# Patient Record
Sex: Female | Born: 1950 | Race: White | Hispanic: No | Marital: Married | Smoking: Former smoker
Health system: Southern US, Community
[De-identification: ages and names within clinical notes are randomized; demographics above are authoritative.]

## PROBLEM LIST (undated history)

## (undated) DIAGNOSIS — E785 Hyperlipidemia, unspecified: Principal | ICD-10-CM

## (undated) DIAGNOSIS — Z8619 Personal history of other infectious and parasitic diseases: Secondary | ICD-10-CM

## (undated) DIAGNOSIS — B059 Measles without complication: Secondary | ICD-10-CM

## (undated) DIAGNOSIS — I10 Essential (primary) hypertension: Secondary | ICD-10-CM

## (undated) DIAGNOSIS — R519 Headache, unspecified: Secondary | ICD-10-CM

## (undated) DIAGNOSIS — Z0000 Encounter for general adult medical examination without abnormal findings: Secondary | ICD-10-CM

## (undated) HISTORY — DX: Personal history of other infectious and parasitic diseases: Z86.19

## (undated) HISTORY — DX: Encounter for general adult medical examination without abnormal findings: Z00.00

## (undated) HISTORY — DX: Measles without complication: B05.9

## (undated) HISTORY — DX: Essential (primary) hypertension: I10

## (undated) HISTORY — DX: Hyperlipidemia, unspecified: E78.5

---

## 1994-12-14 HISTORY — PX: ABDOMINAL HYSTERECTOMY: SHX81

## 1998-09-03 ENCOUNTER — Other Ambulatory Visit: Admission: RE | Admit: 1998-09-03 | Discharge: 1998-09-03 | Payer: Self-pay | Admitting: Obstetrics and Gynecology

## 2001-02-04 ENCOUNTER — Encounter: Payer: Self-pay | Admitting: Gastroenterology

## 2001-02-04 ENCOUNTER — Ambulatory Visit (HOSPITAL_COMMUNITY): Admission: RE | Admit: 2001-02-04 | Discharge: 2001-02-04 | Payer: Self-pay | Admitting: Gastroenterology

## 2001-06-05 ENCOUNTER — Encounter: Payer: Self-pay | Admitting: Gastroenterology

## 2001-06-05 ENCOUNTER — Ambulatory Visit (HOSPITAL_COMMUNITY): Admission: RE | Admit: 2001-06-05 | Discharge: 2001-06-05 | Payer: Self-pay | Admitting: Gastroenterology

## 2002-02-10 ENCOUNTER — Other Ambulatory Visit: Admission: RE | Admit: 2002-02-10 | Discharge: 2002-02-10 | Payer: Self-pay | Admitting: Obstetrics and Gynecology

## 2004-06-14 ENCOUNTER — Encounter: Admission: RE | Admit: 2004-06-14 | Discharge: 2004-06-14 | Payer: Self-pay | Admitting: Neurosurgery

## 2004-07-05 ENCOUNTER — Encounter: Admission: RE | Admit: 2004-07-05 | Discharge: 2004-07-05 | Payer: Self-pay | Admitting: Neurosurgery

## 2004-07-21 ENCOUNTER — Encounter: Admission: RE | Admit: 2004-07-21 | Discharge: 2004-07-21 | Payer: Self-pay | Admitting: Neurosurgery

## 2004-09-27 ENCOUNTER — Encounter: Admission: RE | Admit: 2004-09-27 | Discharge: 2004-09-27 | Payer: Self-pay | Admitting: Family Medicine

## 2004-11-13 LAB — HM COLONOSCOPY: HM Colonoscopy: NORMAL

## 2012-02-02 ENCOUNTER — Telehealth: Payer: Self-pay | Admitting: Internal Medicine

## 2012-02-02 ENCOUNTER — Encounter: Payer: Self-pay | Admitting: Internal Medicine

## 2012-02-02 ENCOUNTER — Ambulatory Visit (INDEPENDENT_AMBULATORY_CARE_PROVIDER_SITE_OTHER): Payer: BC Managed Care – PPO | Admitting: Internal Medicine

## 2012-02-02 VITALS — BP 142/90 | HR 67 | Temp 97.7°F | Resp 18 | Ht 65.5 in | Wt 202.0 lb

## 2012-02-02 DIAGNOSIS — G8929 Other chronic pain: Secondary | ICD-10-CM

## 2012-02-02 DIAGNOSIS — R748 Abnormal levels of other serum enzymes: Secondary | ICD-10-CM

## 2012-02-02 DIAGNOSIS — Z79899 Other long term (current) drug therapy: Secondary | ICD-10-CM

## 2012-02-02 DIAGNOSIS — M549 Dorsalgia, unspecified: Secondary | ICD-10-CM

## 2012-02-02 DIAGNOSIS — Z0000 Encounter for general adult medical examination without abnormal findings: Secondary | ICD-10-CM

## 2012-02-02 DIAGNOSIS — I10 Essential (primary) hypertension: Secondary | ICD-10-CM

## 2012-02-02 LAB — CBC WITH DIFFERENTIAL/PLATELET
Basophils Absolute: 0 10*3/uL (ref 0.0–0.1)
Basophils Relative: 1 % (ref 0–1)
Eosinophils Absolute: 0.1 10*3/uL (ref 0.0–0.7)
Eosinophils Relative: 2 % (ref 0–5)
HCT: 45.2 % (ref 36.0–46.0)
Hemoglobin: 15.2 g/dL — ABNORMAL HIGH (ref 12.0–15.0)
Lymphocytes Relative: 39 % (ref 12–46)
Lymphs Abs: 2.5 10*3/uL (ref 0.7–4.0)
MCH: 30 pg (ref 26.0–34.0)
MCHC: 33.6 g/dL (ref 30.0–36.0)
MCV: 89.2 fL (ref 78.0–100.0)
Monocytes Absolute: 0.5 10*3/uL (ref 0.1–1.0)
Monocytes Relative: 8 % (ref 3–12)
Neutro Abs: 3.3 10*3/uL (ref 1.7–7.7)
Neutrophils Relative %: 51 % (ref 43–77)
Platelets: 272 10*3/uL (ref 150–400)
RBC: 5.07 MIL/uL (ref 3.87–5.11)
RDW: 12.7 % (ref 11.5–15.5)
WBC: 6.5 10*3/uL (ref 4.0–10.5)

## 2012-02-02 MED ORDER — LISINOPRIL-HYDROCHLOROTHIAZIDE 20-12.5 MG PO TABS: 1.0000 | ORAL_TABLET | Freq: Every day | ORAL | Status: DC

## 2012-02-02 MED ORDER — DICLOFENAC POTASSIUM 50 MG PO TABS: 50.0000 mg | ORAL_TABLET | Freq: Two times a day (BID) | ORAL | Status: DC | PRN

## 2012-02-02 NOTE — Patient Instructions (Signed)
Please schedule chem7, lft- v58.69 prior to next visit

## 2012-02-02 NOTE — Telephone Encounter (Signed)
Please schedule chem7, lft- v58.69 prior to next visit  Patient has upcoming appt on 08-02-12. Patient will be going to [facility] lab

## 2012-02-02 NOTE — Telephone Encounter (Signed)
Lab orders entered for September 2013. 

## 2012-02-03 LAB — HEPATIC FUNCTION PANEL
ALT: 60 U/L — ABNORMAL HIGH (ref 0–35)
AST: 40 U/L — ABNORMAL HIGH (ref 0–37)
Albumin: 4.5 g/dL (ref 3.5–5.2)
Alkaline Phosphatase: 115 U/L (ref 39–117)
Bilirubin, Direct: 0.1 mg/dL (ref 0.0–0.3)
Indirect Bilirubin: 0.5 mg/dL (ref 0.0–0.9)
Total Bilirubin: 0.6 mg/dL (ref 0.3–1.2)
Total Protein: 7.4 g/dL (ref 6.0–8.3)

## 2012-02-03 LAB — LIPID PANEL
Cholesterol: 181 mg/dL (ref 0–200)
HDL: 63 mg/dL (ref >39)
LDL Cholesterol: 109 mg/dL — ABNORMAL HIGH (ref 0–99)
Total CHOL/HDL Ratio: 2.9 Ratio
Triglycerides: 46 mg/dL (ref <150)
VLDL: 9 mg/dL (ref 0–40)

## 2012-02-03 LAB — BASIC METABOLIC PANEL
BUN: 18 mg/dL (ref 6–23)
CO2: 26 mEq/L (ref 19–32)
Calcium: 9.3 mg/dL (ref 8.4–10.5)
Chloride: 103 mEq/L (ref 96–112)
Creat: 0.74 mg/dL (ref 0.50–1.10)
Glucose, Bld: 86 mg/dL (ref 70–99)
Potassium: 4.4 mEq/L (ref 3.5–5.3)
Sodium: 140 mEq/L (ref 135–145)

## 2012-02-04 DIAGNOSIS — M549 Dorsalgia, unspecified: Secondary | ICD-10-CM | POA: Insufficient documentation

## 2012-02-04 DIAGNOSIS — G8929 Other chronic pain: Secondary | ICD-10-CM | POA: Insufficient documentation

## 2012-02-04 DIAGNOSIS — R748 Abnormal levels of other serum enzymes: Secondary | ICD-10-CM | POA: Insufficient documentation

## 2012-02-04 DIAGNOSIS — I10 Essential (primary) hypertension: Secondary | ICD-10-CM | POA: Insufficient documentation

## 2012-02-04 NOTE — Assessment & Plan Note (Signed)
Obtain lft 

## 2012-02-04 NOTE — Progress Notes (Signed)
  Subjective:    Patient ID: Jeanette Mckee, female    DOB: 05-05-1951, 61 y.o.   MRN: [id number]  HPI Pt presents to clinic for evaluation of multiple medical problems.  H/o HTN well controlled on outpt bp monitoring. Recent bp 110/70. Notes some degree of white coat htn.  Tolerates ace inhibitor without cough. Using cataflam prn for chronic intermittent lbp. S/p epidural injxns x3 in 2005 without improvement. Notes no gi adverse effect. H/o abn lft with liver bx 2005. Unknown results but was recommended for steroids which she declined. Changed diet and lft's normalized. Since then has had intermittent mild elevations only. utd with gyn for pap and mammograms.  Past Medical History  Diagnosis Date  . History of chicken pox     childhood  . Hypertension    Past Surgical History  Procedure Date  . Abdominal hysterectomy 1996    reports that she has quit smoking. She has never used smokeless tobacco. She reports that she drinks alcohol. She reports that she does not use illicit drugs. family history includes Alcohol abuse in her father; Arthritis in an unspecified family member; Diabetes in her father; Healthy in some unspecified family members; Heart attack in an unspecified family member; Heart disease in her father; Hypertension in her father; Prostate cancer in her father; and Stroke in her father.  There is no history of Breast cancer and Colon cancer. No Known Allergies   Review of Systems  Respiratory: Negative for cough and shortness of breath.   Cardiovascular: Negative for chest pain.  Gastrointestinal: Negative for abdominal pain.  Musculoskeletal: Positive for back pain.  All other systems reviewed and are negative.       Objective:   Physical Exam  Nursing note and vitals reviewed. Constitutional: She appears well-developed and well-nourished. No distress.  HENT:  Head: Normocephalic and atraumatic.  Right Ear: External ear normal.  Left Ear: External ear normal.    Nose: Nose normal.  Mouth/Throat: Oropharynx is clear and moist. No oropharyngeal exudate.  Eyes: Conjunctivae and EOM are normal. Pupils are equal, round, and reactive to light. Right eye exhibits no discharge. Left eye exhibits no discharge. No scleral icterus.  Neck: Neck supple. Carotid bruit is not present. No thyromegaly present.  Cardiovascular: Normal rate, regular rhythm and normal heart sounds.  Exam reveals no gallop and no friction rub.   No murmur heard. Pulmonary/Chest: Effort normal and breath sounds normal. No respiratory distress. She has no wheezes. She has no rales.  Lymphadenopathy:    She has no cervical adenopathy.  Neurological: She is alert.  Skin: Skin is warm and dry. She is not diaphoretic.  Psychiatric: She has a normal mood and affect.          Assessment & Plan:

## 2012-02-04 NOTE — Assessment & Plan Note (Addendum)
Normotensive and stable. Continue current regimen. Monitor bp as outpt and followup in clinic as scheduled. Obtain cbc, chem7 

## 2012-02-04 NOTE — Assessment & Plan Note (Signed)
Stable. Continue diclofenac prn.

## 2012-02-13 ENCOUNTER — Other Ambulatory Visit: Payer: Self-pay | Admitting: Internal Medicine

## 2012-02-13 DIAGNOSIS — R748 Abnormal levels of other serum enzymes: Secondary | ICD-10-CM

## 2012-02-16 ENCOUNTER — Encounter: Payer: Self-pay | Admitting: Internal Medicine

## 2012-02-29 ENCOUNTER — Telehealth: Payer: Self-pay | Admitting: *Deleted

## 2012-02-29 NOTE — Telephone Encounter (Signed)
Patient called and left voice message stating she was seen initially with Dr Rodena Medin and she has received a EOB for her office visit, informing her she was responsible for $278 for the office visit and $24.72 for the lab visit. She stated the visit should have been coded as a preventative office visit.  She would like to know if office visit could be change.

## 2012-02-29 NOTE — Telephone Encounter (Signed)
Ok to change to new preventive age 61 but i can't do it from epic. Possibly someone in administration. Also not sure it will help. It was a new pt visit so for the eob to suggest she is responsible for that much may imply there is a deductible involved.

## 2012-03-01 NOTE — Telephone Encounter (Signed)
Being changed.

## 2012-03-01 NOTE — Telephone Encounter (Signed)
Call placed to patient at [phone], no answer. A detailed voice message was left informing patient office is in the process of being adjusted. Message was left for patient to call back if any additional questions or concerns.

## 2012-03-12 ENCOUNTER — Telehealth: Payer: Self-pay | Admitting: Internal Medicine

## 2012-03-12 NOTE — Telephone Encounter (Signed)
ok 

## 2012-03-12 NOTE — Telephone Encounter (Signed)
Refill- diclofenac 50mg  dr tab. Take one by mouth twice daily as needed. Qty 180 last fill 3.8.13

## 2012-03-12 NOTE — Telephone Encounter (Signed)
Call placed to [facility] pharmacy at [phone], pharmacist has verified Rx refill received on 02/02/2012. No refills needed at this time.

## 2012-06-13 LAB — HM MAMMOGRAPHY: HM Mammogram: NORMAL

## 2012-08-02 ENCOUNTER — Ambulatory Visit: Payer: BC Managed Care – PPO | Admitting: Internal Medicine

## 2012-10-13 LAB — HM PAP SMEAR: HM Pap smear: NORMAL

## 2012-12-28 ENCOUNTER — Other Ambulatory Visit: Payer: Self-pay

## 2013-01-06 ENCOUNTER — Telehealth: Payer: Self-pay | Admitting: Internal Medicine

## 2013-01-06 ENCOUNTER — Other Ambulatory Visit: Payer: Self-pay | Admitting: *Deleted

## 2013-01-06 MED ORDER — LISINOPRIL-HYDROCHLOROTHIAZIDE 20-12.5 MG PO TABS: 1.0000 | ORAL_TABLET | Freq: Every day | ORAL | Status: DC

## 2013-01-06 MED ORDER — DICLOFENAC POTASSIUM 50 MG PO TABS: 50.0000 mg | ORAL_TABLET | Freq: Two times a day (BID) | ORAL | Status: DC | PRN

## 2013-01-06 NOTE — Telephone Encounter (Signed)
cataflam and lisino-hctz filled for 30days

## 2013-01-06 NOTE — Telephone Encounter (Signed)
Refill- cataflam 50mg  tab. Take one tablet by mouth twice daily as needed. Qty 60 last fill 1.1.14  Refill- lisino-hctz 20-12.5 tab. Take one tablet by mouth every day. Qty 90 last fill 12.6.13

## 2013-02-11 ENCOUNTER — Other Ambulatory Visit: Payer: Self-pay | Admitting: Family Medicine

## 2013-02-11 ENCOUNTER — Ambulatory Visit (INDEPENDENT_AMBULATORY_CARE_PROVIDER_SITE_OTHER): Payer: BC Managed Care – PPO | Admitting: Family Medicine

## 2013-02-11 ENCOUNTER — Encounter: Payer: Self-pay | Admitting: Family Medicine

## 2013-02-11 VITALS — BP 142/70 | HR 66 | Temp 97.8°F | Ht 65.5 in | Wt 195.0 lb

## 2013-02-11 DIAGNOSIS — Z0000 Encounter for general adult medical examination without abnormal findings: Secondary | ICD-10-CM

## 2013-02-11 DIAGNOSIS — I10 Essential (primary) hypertension: Secondary | ICD-10-CM

## 2013-02-11 DIAGNOSIS — R748 Abnormal levels of other serum enzymes: Secondary | ICD-10-CM

## 2013-02-11 DIAGNOSIS — E785 Hyperlipidemia, unspecified: Secondary | ICD-10-CM

## 2013-02-11 HISTORY — DX: Encounter for general adult medical examination without abnormal findings: Z00.00

## 2013-02-11 HISTORY — DX: Hyperlipidemia, unspecified: E78.5

## 2013-02-11 LAB — CBC
HCT: 43.8 % (ref 36.0–46.0)
Hemoglobin: 15.2 g/dL — ABNORMAL HIGH (ref 12.0–15.0)
MCH: 30.3 pg (ref 26.0–34.0)
MCHC: 34.7 g/dL (ref 30.0–36.0)
MCV: 87.4 fL (ref 78.0–100.0)
Platelets: 325 10*3/uL (ref 150–400)
RBC: 5.01 MIL/uL (ref 3.87–5.11)
RDW: 13.7 % (ref 11.5–15.5)
WBC: 6.9 10*3/uL (ref 4.0–10.5)

## 2013-02-11 MED ORDER — DICLOFENAC POTASSIUM 50 MG PO TABS: 50.0000 mg | ORAL_TABLET | Freq: Two times a day (BID) | ORAL | Status: DC | PRN

## 2013-02-11 MED ORDER — LISINOPRIL-HYDROCHLOROTHIAZIDE 20-12.5 MG PO TABS: 1.0000 | ORAL_TABLET | Freq: Every day | ORAL | Status: DC

## 2013-02-11 NOTE — Patient Instructions (Addendum)
Needs bp check with nurse in  2-3 months Consider MegaRed caps daily, krill oil by [employer] Consider a probiotic such as Digestive Advantage No partially hydrogenated/trans fats   Preventive Care for Adults, Female A healthy lifestyle and preventive care can promote health and wellness. Preventive health guidelines for women include the following key practices.  A routine yearly physical is a good way to check with your caregiver about your health and preventive screening. It is a chance to share any concerns and updates on your health, and to receive a thorough exam.  Visit your dentist for a routine exam and preventive care every 6 months. Brush your teeth twice a day and floss once a day. Good oral hygiene prevents tooth decay and gum disease.  The frequency of eye exams is based on your age, health, family medical history, use of contact lenses, and other factors. Follow your caregiver's recommendations for frequency of eye exams.  Eat a healthy diet. Foods like vegetables, fruits, whole grains, low-fat dairy products, and lean protein foods contain the nutrients you need without too many calories. Decrease your intake of foods high in solid fats, added sugars, and salt. Eat the right amount of calories for you.Get information about a proper diet from your caregiver, if necessary.  Regular physical exercise is one of the most important things you can do for your health. Most adults should get at least 150 minutes of moderate-intensity exercise (any activity that increases your heart rate and causes you to sweat) each week. In addition, most adults need muscle-strengthening exercises on 2 or more days a week.  Maintain a healthy weight. The body mass index (BMI) is a screening tool to identify possible weight problems. It provides an estimate of body fat based on height and weight. Your caregiver can help determine your BMI, and can help you achieve or maintain a healthy weight.For adults 20  years and older:  A BMI below 18.5 is considered underweight.  A BMI of 18.5 to 24.9 is normal.  A BMI of 25 to 29.9 is considered overweight.  A BMI of 30 and above is considered obese.  Maintain normal blood lipids and cholesterol levels by exercising and minimizing your intake of saturated fat. Eat a balanced diet with plenty of fruit and vegetables. Blood tests for lipids and cholesterol should begin at age 35 and be repeated every 5 years. If your lipid or cholesterol levels are high, you are over 50, or you are at high risk for heart disease, you may need your cholesterol levels checked more frequently.Ongoing high lipid and cholesterol levels should be treated with medicines if diet and exercise are not effective.  If you smoke, find out from your caregiver how to quit. If you do not use tobacco, do not start.  If you are pregnant, do not drink alcohol. If you are breastfeeding, be very cautious about drinking alcohol. If you are not pregnant and choose to drink alcohol, do not exceed 1 drink per day. One drink is considered to be 12 ounces (355 mL) of beer, 5 ounces (148 mL) of wine, or 1.5 ounces (44 mL) of liquor.  Avoid use of street drugs. Do not share needles with anyone. Ask for help if you need support or instructions about stopping the use of drugs.  High blood pressure causes heart disease and increases the risk of stroke. Your blood pressure should be checked at least every 1 to 2 years. Ongoing high blood pressure should be treated with medicines  if weight loss and exercise are not effective.  If you are 98 to 64 years old, ask your caregiver if you should take aspirin to prevent strokes.  Diabetes screening involves taking a blood sample to check your fasting blood sugar level. This should be done once every 3 years, after age 34, if you are within normal weight and without risk factors for diabetes. Testing should be considered at a younger age or be carried out more  frequently if you are overweight and have at least 1 risk factor for diabetes.  Breast cancer screening is essential preventive care for women. You should practice "breast self-awareness." This means understanding the normal appearance and feel of your breasts and may include breast self-examination. Any changes detected, no matter how small, should be reported to a caregiver. Women in their 70s and 30s should have a clinical breast exam (CBE) by a caregiver as part of a regular health exam every 1 to 3 years. After age 45, women should have a CBE every year. Starting at age 77, women should consider having a mammography (breast X-ray test) every year. Women who have a family history of breast cancer should talk to their caregiver about genetic screening. Women at a high risk of breast cancer should talk to their caregivers about having magnetic resonance imaging (MRI) and a mammography every year.  The Pap test is a screening test for cervical cancer. A Pap test can show cell changes on the cervix that might become cervical cancer if left untreated. A Pap test is a procedure in which cells are obtained and examined from the lower end of the uterus (cervix).  Women should have a Pap test starting at age 56.  Between ages 59 and 24, Pap tests should be repeated every 2 years.  Beginning at age 82, you should have a Pap test every 3 years as long as the past 3 Pap tests have been normal.  Some women have medical problems that increase the chance of getting cervical cancer. Talk to your caregiver about these problems. It is especially important to talk to your caregiver if a new problem develops soon after your last Pap test. In these cases, your caregiver may recommend more frequent screening and Pap tests.  The above recommendations are the same for women who have or have not gotten the vaccine for human papillomavirus (HPV).  If you had a hysterectomy for a problem that was not cancer or a condition  that could lead to cancer, then you no longer need Pap tests. Even if you no longer need a Pap test, a regular exam is a good idea to make sure no other problems are starting.  If you are between ages 39 and 49, and you have had normal Pap tests going back 10 years, you no longer need Pap tests. Even if you no longer need a Pap test, a regular exam is a good idea to make sure no other problems are starting.  If you have had past treatment for cervical cancer or a condition that could lead to cancer, you need Pap tests and screening for cancer for at least 20 years after your treatment.  If Pap tests have been discontinued, risk factors (such as a new sexual partner) need to be reassessed to determine if screening should be resumed.  The HPV test is an additional test that may be used for cervical cancer screening. The HPV test looks for the virus that can cause the cell changes on  the cervix. The cells collected during the Pap test can be tested for HPV. The HPV test could be used to screen women aged 75 years and older, and should be used in women of any age who have unclear Pap test results. After the age of 51, women should have HPV testing at the same frequency as a Pap test.  Colorectal cancer can be detected and often prevented. Most routine colorectal cancer screening begins at the age of 96 and continues through age 85. However, your caregiver may recommend screening at an earlier age if you have risk factors for colon cancer. On a yearly basis, your caregiver may provide home test kits to check for hidden blood in the stool. Use of a small camera at the end of a tube, to directly examine the colon (sigmoidoscopy or colonoscopy), can detect the earliest forms of colorectal cancer. Talk to your caregiver about this at age 66, when routine screening begins. Direct examination of the colon should be repeated every 5 to 10 years through age 8, unless early forms of pre-cancerous polyps or small  growths are found.  Hepatitis C blood testing is recommended for all people born from [birth year] through 1965 and any individual with known risks for hepatitis C.  Practice safe sex. Use condoms and avoid high-risk sexual practices to reduce the spread of sexually transmitted infections (STIs). STIs include gonorrhea, chlamydia, syphilis, trichomonas, herpes, HPV, and human immunodeficiency virus (HIV). Herpes, HIV, and HPV are viral illnesses that have no cure. They can result in disability, cancer, and death. Sexually active women aged 77 and younger should be checked for chlamydia. Older women with new or multiple partners should also be tested for chlamydia. Testing for other STIs is recommended if you are sexually active and at increased risk.  Osteoporosis is a disease in which the bones lose minerals and strength with aging. This can result in serious bone fractures. The risk of osteoporosis can be identified using a bone density scan. Women ages 81 and over and women at risk for fractures or osteoporosis should discuss screening with their caregivers. Ask your caregiver whether you should take a calcium supplement or vitamin D to reduce the rate of osteoporosis.  Menopause can be associated with physical symptoms and risks. Hormone replacement therapy is available to decrease symptoms and risks. You should talk to your caregiver about whether hormone replacement therapy is right for you.  Use sunscreen with sun protection factor (SPF) of 30 or more. Apply sunscreen liberally and repeatedly throughout the day. You should seek shade when your shadow is shorter than you. Protect yourself by wearing long sleeves, pants, a wide-brimmed hat, and sunglasses year round, whenever you are outdoors.  Once a month, do a whole body skin exam, using a mirror to look at the skin on your back. Notify your caregiver of new moles, moles that have irregular borders, moles that are larger than a pencil eraser, or moles  that have changed in shape or color.  Stay current with required immunizations.  Influenza. You need a dose every fall (or winter). The composition of the flu vaccine changes each year, so being vaccinated once is not enough.  Pneumococcal polysaccharide. You need 1 to 2 doses if you smoke cigarettes or if you have certain chronic medical conditions. You need 1 dose at age 62 (or older) if you have never been vaccinated.  Tetanus, diphtheria, pertussis (Tdap, Td). Get 1 dose of Tdap vaccine if you are younger than age 36,  are over 65 and have contact with an infant, are a Research scientist (physical sciences), are pregnant, or simply want to be protected from whooping cough. After that, you need a Td booster dose every 10 years. Consult your caregiver if you have not had at least 3 tetanus and diphtheria-containing shots sometime in your life or have a deep or dirty wound.  HPV. You need this vaccine if you are a woman age 74 or younger. The vaccine is given in 3 doses over 6 months.  Measles, mumps, rubella (MMR). You need at least 1 dose of MMR if you were born in 1957 or later. You may also need a second dose.  Meningococcal. If you are age 48 to 24 and a first-year college student living in a residence hall, or have one of several medical conditions, you need to get vaccinated against meningococcal disease. You may also need additional booster doses.  Zoster (shingles). If you are age 56 or older, you should get this vaccine.  Varicella (chickenpox). If you have never had chickenpox or you were vaccinated but received only 1 dose, talk to your caregiver to find out if you need this vaccine.  Hepatitis A. You need this vaccine if you have a specific risk factor for hepatitis A virus infection or you simply wish to be protected from this disease. The vaccine is usually given as 2 doses, 6 to 18 months apart.  Hepatitis B. You need this vaccine if you have a specific risk factor for hepatitis B virus infection or  you simply wish to be protected from this disease. The vaccine is given in 3 doses, usually over 6 months. Preventive Services / Frequency Ages 22 to 68  Blood pressure check.** / Every 1 to 2 years.  Lipid and cholesterol check.** / Every 5 years beginning at age 19.  Clinical breast exam.** / Every 3 years for women in their 110s and 30s.  Pap test.** / Every 2 years from ages 60 through 42. Every 3 years starting at age 78 through age 29 or 63 with a history of 3 consecutive normal Pap tests.  HPV screening.** / Every 3 years from ages 47 through ages 80 to 62 with a history of 3 consecutive normal Pap tests.  Hepatitis C blood test.** / For any individual with known risks for hepatitis C.  Skin self-exam. / Monthly.  Influenza immunization.** / Every year.  Pneumococcal polysaccharide immunization.** / 1 to 2 doses if you smoke cigarettes or if you have certain chronic medical conditions.  Tetanus, diphtheria, pertussis (Tdap, Td) immunization. / A one-time dose of Tdap vaccine. After that, you need a Td booster dose every 10 years.  HPV immunization. / 3 doses over 6 months, if you are 18 and younger.  Measles, mumps, rubella (MMR) immunization. / You need at least 1 dose of MMR if you were born in 1957 or later. You may also need a second dose.  Meningococcal immunization. / 1 dose if you are age 47 to 13 and a first-year college student living in a residence hall, or have one of several medical conditions, you need to get vaccinated against meningococcal disease. You may also need additional booster doses.  Varicella immunization.** / Consult your caregiver.  Hepatitis A immunization.** / Consult your caregiver. 2 doses, 6 to 18 months apart.  Hepatitis B immunization.** / Consult your caregiver. 3 doses usually over 6 months. Ages 52 to 79  Blood pressure check.** / Every 1 to 2 years.  Lipid  and cholesterol check.** / Every 5 years beginning at age 15.  Clinical  breast exam.** / Every year after age 41.  Mammogram.** / Every year beginning at age 25 and continuing for as long as you are in good health. Consult with your caregiver.  Pap test.** / Every 3 years starting at age 63 through age 32 or 76 with a history of 3 consecutive normal Pap tests.  HPV screening.** / Every 3 years from ages 75 through ages 58 to 53 with a history of 3 consecutive normal Pap tests.  Fecal occult blood test (FOBT) of stool. / Every year beginning at age 62 and continuing until age 81. You may not need to do this test if you get a colonoscopy every 10 years.  Flexible sigmoidoscopy or colonoscopy.** / Every 5 years for a flexible sigmoidoscopy or every 10 years for a colonoscopy beginning at age 85 and continuing until age 38.  Hepatitis C blood test.** / For all people born from [birth year] through 1965 and any individual with known risks for hepatitis C.  Skin self-exam. / Monthly.  Influenza immunization.** / Every year.  Pneumococcal polysaccharide immunization.** / 1 to 2 doses if you smoke cigarettes or if you have certain chronic medical conditions.  Tetanus, diphtheria, pertussis (Tdap, Td) immunization.** / A one-time dose of Tdap vaccine. After that, you need a Td booster dose every 10 years.  Measles, mumps, rubella (MMR) immunization. / You need at least 1 dose of MMR if you were born in 1957 or later. You may also need a second dose.  Varicella immunization.** / Consult your caregiver.  Meningococcal immunization.** / Consult your caregiver.  Hepatitis A immunization.** / Consult your caregiver. 2 doses, 6 to 18 months apart.  Hepatitis B immunization.** / Consult your caregiver. 3 doses, usually over 6 months. Ages 37 and over  Blood pressure check.** / Every 1 to 2 years.  Lipid and cholesterol check.** / Every 5 years beginning at age 51.  Clinical breast exam.** / Every year after age 2.  Mammogram.** / Every year beginning at age 60 and  continuing for as long as you are in good health. Consult with your caregiver.  Pap test.** / Every 3 years starting at age 69 through age 56 or 80 with a 3 consecutive normal Pap tests. Testing can be stopped between 65 and 70 with 3 consecutive normal Pap tests and no abnormal Pap or HPV tests in the past 10 years.  HPV screening.** / Every 3 years from ages 4 through ages 44 or 52 with a history of 3 consecutive normal Pap tests. Testing can be stopped between 65 and 70 with 3 consecutive normal Pap tests and no abnormal Pap or HPV tests in the past 10 years.  Fecal occult blood test (FOBT) of stool. / Every year beginning at age 15 and continuing until age 40. You may not need to do this test if you get a colonoscopy every 10 years.  Flexible sigmoidoscopy or colonoscopy.** / Every 5 years for a flexible sigmoidoscopy or every 10 years for a colonoscopy beginning at age 50 and continuing until age 22.  Hepatitis C blood test.** / For all people born from [birth year] through 1967 and any individual with known risks for hepatitis C.  Osteoporosis screening.** / A one-time screening for women ages 25 and over and women at risk for fractures or osteoporosis.  Skin self-exam. / Monthly.  Influenza immunization.** / Every year.  Pneumococcal polysaccharide immunization.** / 1  dose at age 18 (or older) if you have never been vaccinated.  Tetanus, diphtheria, pertussis (Tdap, Td) immunization. / A one-time dose of Tdap vaccine if you are over 65 and have contact with an infant, are a Research scientist (physical sciences), or simply want to be protected from whooping cough. After that, you need a Td booster dose every 10 years.  Varicella immunization.** / Consult your caregiver.  Meningococcal immunization.** / Consult your caregiver.  Hepatitis A immunization.** / Consult your caregiver. 2 doses, 6 to 18 months apart.  Hepatitis B immunization.** / Check with your caregiver. 3 doses, usually over 6 months. ** Family  history and personal history of risk and conditions may change your caregiver's recommendations. Document Released: 12/26/2001 Document Revised: 01/22/2012 Document Reviewed: 03/27/2011 [facility] Patient Information 2013 [place], [place].

## 2013-02-12 LAB — BASIC METABOLIC PANEL
BUN: 15 mg/dL (ref 6–23)
CO2: 31 mEq/L (ref 19–32)
Calcium: 9.3 mg/dL (ref 8.4–10.5)
Chloride: 102 mEq/L (ref 96–112)
Creat: 0.68 mg/dL (ref 0.50–1.10)
Glucose, Bld: 91 mg/dL (ref 70–99)
Potassium: 4.8 mEq/L (ref 3.5–5.3)
Sodium: 138 mEq/L (ref 135–145)

## 2013-02-12 LAB — LIPID PANEL
Cholesterol: 187 mg/dL (ref 0–200)
HDL: 46 mg/dL (ref >39)
LDL Cholesterol: 132 mg/dL — ABNORMAL HIGH (ref 0–99)
Total CHOL/HDL Ratio: 4.1 Ratio
Triglycerides: 46 mg/dL (ref <150)
VLDL: 9 mg/dL (ref 0–40)

## 2013-02-12 LAB — HEPATIC FUNCTION PANEL
ALT: 46 U/L — ABNORMAL HIGH (ref 0–35)
AST: 33 U/L (ref 0–37)
Albumin: 4.1 g/dL (ref 3.5–5.2)
Alkaline Phosphatase: 88 U/L (ref 39–117)
Bilirubin, Direct: 0.1 mg/dL (ref 0.0–0.3)
Indirect Bilirubin: 0.3 mg/dL (ref 0.0–0.9)
Total Bilirubin: 0.4 mg/dL (ref 0.3–1.2)
Total Protein: 7.2 g/dL (ref 6.0–8.3)

## 2013-02-12 LAB — PHOSPHORUS: Phosphorus: 3.5 mg/dL (ref 2.3–4.6)

## 2013-02-12 LAB — TSH: TSH: 1.061 u[IU]/mL (ref 0.350–4.500)

## 2013-02-15 NOTE — Assessment & Plan Note (Signed)
Mild, avoid trans fats, minimize simple carbs and saturated fats. Start Krill oil caps daily

## 2013-02-15 NOTE — Assessment & Plan Note (Signed)
Improved on recheck, encouraged to continue exercise regimen, given paper work on [employer] diet and recheck bp in 3-4 months or sooner as needed. Patient encouraged to monitor bp weekly and let us know if numbers running above 140/90 routinely

## 2013-02-15 NOTE — Progress Notes (Signed)
Patient ID: Jeanette Mckee, female   DOB: 24-Dec-1950, 62 y.o.   MRN: [id number] Jeanette Mckee Jeanette Mckee 1951/08/06 02/15/2013      Progress Note New Patient  Subjective  Chief Complaint  Chief Complaint  Patient presents with  . Annual Exam    physical/ fasting    HPI  Patient is a 62 year old Caucasian female who is in today for annual exam. Overall she feels well. She's recently returned from a mission trip to [place] and enjoyed her time there. Has not had any recent illness. Denies headaches, fevers, congestion, chest pain, palpitations, shortness of breath, GI or GU concerns today. Has been sleeping well and exercising routinely.  Past Medical History  Diagnosis Date  . History of chicken pox     childhood  . Hypertension   . Measles as a child  . Other and unspecified hyperlipidemia 02/11/2013  . Preventative health care 02/11/2013    Past Surgical History  Procedure Laterality Date  . Abdominal hysterectomy  1996    Family History  Problem Relation Age of Onset  . Alcohol abuse Father   . Heart disease Father   . Stroke Father   . Hypertension Father   . Diabetes Father     type 2  . Cancer Father     prostate  . Breast cancer Neg Hx   . Colon cancer Neg Hx   . Heart disease Mother   . Hypertension Mother   . Arthritis Mother   . Heart attack Mother 16  . Crohn's disease Mother   . Cancer Maternal Grandmother     bone- nonsmoker    History   Social History  . Marital Status: Married    Spouse Name: N/A    Number of Children: N/A  . Years of Education: N/A   Occupational History  . Not on file.   Social History Main Topics  . Smoking status: Former Games developer  . Smokeless tobacco: Never Used     Comment: quit in 2003 1 ppd for 20 years  . Alcohol Use: Yes  . Drug Use: No  . Sexually Active: Not on file   Other Topics Concern  . Not on file   Social History Narrative  . No narrative on file    No current outpatient prescriptions on file  prior to visit.   No current facility-administered medications on file prior to visit.    No Known Allergies  Review of Systems  Review of Systems  Constitutional: Negative for fever, chills and malaise/fatigue.  HENT: Negative for hearing loss, nosebleeds and congestion.   Eyes: Negative for discharge.  Respiratory: Negative for cough, sputum production, shortness of breath and wheezing.   Cardiovascular: Negative for chest pain, palpitations and leg swelling.  Gastrointestinal: Negative for heartburn, nausea, vomiting, abdominal pain, diarrhea, constipation and blood in stool.  Genitourinary: Negative for dysuria, urgency, frequency and hematuria.  Musculoskeletal: Negative for myalgias, back pain and falls.  Skin: Negative for rash.  Neurological: Negative for dizziness, tremors, sensory change, focal weakness, loss of consciousness, weakness and headaches.  Endo/Heme/Allergies: Negative for polydipsia. Does not bruise/bleed easily.  Psychiatric/Behavioral: Negative for depression and suicidal ideas. The patient is not nervous/anxious and does not have insomnia.     Objective  BP 142/70  Pulse 66  Temp(Src) 97.8 F (36.6 C) (Oral)  Ht 5' 5.5" (1.664 m)  Wt 195 lb (88.451 kg)  BMI 31.94 kg/m2  SpO2 95%  Physical Exam  Physical Exam  Constitutional: She is  oriented to person, place, and time and well-developed, well-nourished, and in no distress. No distress.  HENT:  Head: Normocephalic and atraumatic.  Right Ear: External ear normal.  Left Ear: External ear normal.  Nose: Nose normal.  Mouth/Throat: Oropharynx is clear and moist. No oropharyngeal exudate.  Eyes: Conjunctivae are normal. Pupils are equal, round, and reactive to light. Right eye exhibits no discharge. Left eye exhibits no discharge. No scleral icterus.  Neck: Normal range of motion. Neck supple. No thyromegaly present.  Cardiovascular: Normal rate, regular rhythm, normal heart sounds and intact distal  pulses.   No murmur heard. Pulmonary/Chest: Effort normal and breath sounds normal. No respiratory distress. She has no wheezes. She has no rales.  Abdominal: Soft. Bowel sounds are normal. She exhibits no distension and no mass. There is no tenderness.  Musculoskeletal: Normal range of motion. She exhibits no edema and no tenderness.  Lymphadenopathy:    She has no cervical adenopathy.  Neurological: She is alert and oriented to person, place, and time. She has normal reflexes. No cranial nerve deficit. Coordination normal.  Skin: Skin is warm and dry. No rash noted. She is not diaphoretic.  Psychiatric: Mood, memory and affect normal.       Assessment & Plan  HTN (hypertension) Improved on recheck, encouraged to continue exercise regimen, given paper work on DASH diet and recheck bp in 3-4 months or sooner as needed. Patient encouraged to monitor bp weekly and let us know if numbers running above 140/90 routinely  Other and unspecified hyperlipidemia Mild, avoid trans fats, minimize simple carbs and saturated fats. Start Krill oil caps daily  Abnormal liver enzymes Improved with recent check, encouraged again to minimize simple carbs and trans and saturated fats. Will continue to monitor  Preventative health care Is exercising regularly and feeling good. No recent illness or concerns. Encouraged DASH diet and 7-8 hours of sleep nightly. Encouraged to consider Zostavax will check with her insurance company about coverage and return for shot if she decides she wants it.

## 2013-02-15 NOTE — Assessment & Plan Note (Signed)
Improved with recent check, encouraged again to minimize simple carbs and trans and saturated fats. Will continue to monitor

## 2013-02-15 NOTE — Assessment & Plan Note (Signed)
Is exercising regularly and feeling good. No recent illness or concerns. Encouraged DASH diet and 7-8 hours of sleep nightly. Encouraged to consider Zostavax will check with her insurance company about coverage and return for shot if she decides she wants it.

## 2013-05-21 ENCOUNTER — Other Ambulatory Visit: Payer: Self-pay | Admitting: Nurse Practitioner

## 2013-05-21 NOTE — Telephone Encounter (Signed)
eScribe request for refill on ESTRATEST HS Last filled - 09/19/12 X 1 YEAR Last AEX - 09/19/12 Next AEX - not scheduled. Last Mammogram - 07/03/12 Please advise refills.  Chart on your desk.  Thanks.

## 2013-05-21 NOTE — Telephone Encounter (Signed)
Patient will need a mammogram in August 2014 and an annual exam in November 2014.

## 2013-05-21 NOTE — Telephone Encounter (Signed)
RX faxed

## 2013-05-22 NOTE — Telephone Encounter (Signed)
I have attempted to contact this patient by phone with the following results: left message to return my call on answering machine (mobile).  

## 2013-09-02 ENCOUNTER — Telehealth: Payer: Self-pay | Admitting: *Deleted

## 2013-09-02 NOTE — Telephone Encounter (Signed)
Pt needs to schedule Annual Exam 

## 2013-09-05 ENCOUNTER — Other Ambulatory Visit: Payer: Self-pay | Admitting: *Deleted

## 2013-09-05 NOTE — Telephone Encounter (Signed)
OK for a refill X 1

## 2013-09-05 NOTE — Telephone Encounter (Signed)
RC from patient.  I asked pt if she had medication remaining as we filled this for 6 months on 05/21/13.  She states that [place] needs approval every 90 days for ESTRATEST HS.   Advised pt we can send new RX, but she is due for AEX and mammogram.  Pt is out of town and states she will call to schedule both when she returns home.   Please approve RX.  This will also need to be signed and faxed.  Thanks.

## 2013-09-05 NOTE — Telephone Encounter (Signed)
Faxed refill request received from pharmacy for ESTRATEST HS Last filled by MD on 05/21/13, #90 X 1 Last AEX - 09/19/12  Next AEX - not scheduled.  Last Mammogram - 07/03/12  Per Dr. Edward Jolly on 05/21/13, pt needs AEX and MMG.  Pt should have medication left.  Message left for patient to return call.

## 2013-09-08 MED ORDER — EST ESTROGENS-METHYLTEST 0.625-1.25 MG PO TABS: 1.0000 | ORAL_TABLET | Freq: Every day | ORAL | Status: DC

## 2013-09-08 NOTE — Telephone Encounter (Signed)
RX printed, signed and faxed.  

## 2013-09-18 ENCOUNTER — Other Ambulatory Visit: Payer: Self-pay

## 2013-11-28 ENCOUNTER — Ambulatory Visit: Payer: Self-pay | Admitting: Nurse Practitioner

## 2013-12-01 ENCOUNTER — Encounter: Payer: Self-pay | Admitting: Nurse Practitioner

## 2013-12-01 ENCOUNTER — Ambulatory Visit (INDEPENDENT_AMBULATORY_CARE_PROVIDER_SITE_OTHER): Payer: Managed Care, Other (non HMO) | Admitting: Nurse Practitioner

## 2013-12-01 VITALS — BP 130/84 | HR 68 | Ht 66.0 in | Wt 210.0 lb

## 2013-12-01 DIAGNOSIS — Z0000 Encounter for general adult medical examination without abnormal findings: Secondary | ICD-10-CM

## 2013-12-01 DIAGNOSIS — Z780 Asymptomatic menopausal state: Secondary | ICD-10-CM

## 2013-12-01 DIAGNOSIS — Z1211 Encounter for screening for malignant neoplasm of colon: Secondary | ICD-10-CM

## 2013-12-01 MED ORDER — EST ESTROGENS-METHYLTEST 0.625-1.25 MG PO TABS: 1.0000 | ORAL_TABLET | Freq: Every day | ORAL | Status: DC

## 2013-12-01 NOTE — Progress Notes (Signed)
Patient ID: Jeanette Mckee, female   DOB: [birth year], 63 y.o.   MRN: [id number] 63 y.o. [id number] Married Caucasian Fe here for annual exam.  Now retired end of October.   No LMP recorded. Patient has had a hysterectomy.          Sexually active: yes  The current method of family planning is status post hysterectomy.    Exercising: yes  Gym/ health club routine includes cardio and mod to heavy weightlifting.  3-5 times per week regularly within the last 2 months Smoker:  no  Health Maintenance: Pap:  09/19/12, WNL, neg HR HPV MMG:  07/03/12, Bi-Rads 1: negative Colonoscopy:  03/30/06, repeat 10 years given IFOB BMD:  never TDaP:  PCP Labs:  PCP, in EPIC   reports that she quit smoking about 12 years ago. She has never used smokeless tobacco. She reports that she drinks alcohol. She reports that she does not use illicit drugs.  Past Medical History  Diagnosis Date  . History of chicken pox     childhood  . Hypertension   . Measles as a child  . Other and unspecified hyperlipidemia 02/11/2013  . Preventative health care 02/11/2013    Past Surgical History  Procedure Laterality Date  . Abdominal hysterectomy  12/1994    and RSO & LS    Current Outpatient Prescriptions  Medication Sig Dispense Refill  . b complex vitamins tablet Take 1 tablet by mouth daily.      . Cholecalciferol (VITAMIN D PO) Take 1 capsule by mouth daily.      . diclofenac (CATAFLAM) 50 MG tablet Take 1 tablet (50 mg total) by mouth 2 (two) times daily as needed.  180 tablet  3  . estrogen-methylTESTOSTERone (EST ESTROGENS-METHYLTEST HS) 0.625-1.25 MG per tablet Take 1 tablet by mouth daily.  90 tablet  0  . lisinopril-hydrochlorothiazide (PRINZIDE,ZESTORETIC) 20-12.5 MG per tablet Take 1 tablet by mouth daily.  90 tablet  3  . Multiple Vitamin (MULTIVITAMIN) tablet Take 1 tablet by mouth daily.      . Omega-3 Fatty Acids (FISH OIL) 1000 MG CAPS Take 1 capsule by mouth daily.      . Vitamin Mixture (VITAMIN E  COMPLETE PO) Take 1 capsule by mouth daily.       No current facility-administered medications for this visit.    Family History  Problem Relation Age of Onset  . Alcohol abuse Father   . Heart disease Father   . Stroke Father   . Hypertension Father   . Diabetes Father     type 2  . Cancer Father     prostate  . Breast cancer Neg Hx   . Colon cancer Neg Hx   . Heart disease Mother   . Hypertension Mother   . Arthritis Mother   . Heart attack Mother 1170  . Crohn's disease Mother   . Cancer Maternal Grandmother     bone- nonsmoker    ROS:  Pertinent items are noted in HPI.  Otherwise, a comprehensive ROS was negative.  Exam:   BP 130/84  Pulse 68  Ht 5\' 6"  (1.676 m)  Wt 210 lb (95.255 kg)  BMI 33.91 kg/m2 Height: 5\' 6"  (167.6 cm)  Ht Readings from Last 3 Encounters:  12/01/13 5\' 6"  (1.676 m)  02/11/13 5' 5.5" (1.664 m)  02/02/12 5' 5.5" (1.664 m)    General appearance: alert, cooperative and appears stated age Head: Normocephalic, without obvious abnormality, atraumatic Neck: no adenopathy, supple, symmetrical,  trachea midline and thyroid normal to inspection and palpation Lungs: clear to auscultation bilaterally Breasts: normal appearance, no masses or tenderness Heart: regular rate and rhythm Abdomen: soft, non-tender; no masses,  no organomegaly Extremities: extremities normal, atraumatic, no cyanosis or edema Skin: Skin color, texture, turgor normal. No rashes or lesions Lymph nodes: Cervical, supraclavicular, and axillary nodes normal. No abnormal inguinal nodes palpated Neurologic: Grossly normal   Pelvic: External genitalia:  no lesions              Urethra:  normal appearing urethra with no masses, tenderness or lesions              Bartholin's and Skene's: normal                 Vagina: normal appearing vagina with normal color and discharge, no lesions              Cervix: absent              Pap taken: no Bimanual Exam:  Uterus:  uterus absent               Adnexa: no mass, fullness, tenderness               Rectovaginal: Confirms               Anus:  normal sphincter tone, no lesions  A:  Well Woman with normal exam  S/P TAH, RSO, LS 2/96 secondary to fibroids, AUB on ERT  Vit D deficiency   P:   Pap smear as per guidelines   Mammogram due now and will schedule ASAP  BMD is also ordered  Refilled ERT for a year  Counseled about ERT risks including DVT,CVA, cancer, etc  IFOB given today  Counseled on breast self exam, mammography screening, adequate intake of calcium and vitamin D, diet and exercise, Kegel's exercises return annually or prn  An After Visit Summary was printed and given to the patient.

## 2013-12-01 NOTE — Patient Instructions (Signed)

## 2013-12-04 NOTE — Progress Notes (Signed)
Encounter reviewed by Dr. Brook Silva.  

## 2014-01-26 ENCOUNTER — Other Ambulatory Visit: Payer: Self-pay

## 2014-01-26 MED ORDER — LISINOPRIL-HYDROCHLOROTHIAZIDE 20-12.5 MG PO TABS: 1.0000 | ORAL_TABLET | Freq: Every day | ORAL | Status: DC

## 2014-01-26 NOTE — Telephone Encounter (Signed)
RX request for Lisinopril-hctz 20 mg-12.5 mg

## 2014-01-27 ENCOUNTER — Telehealth: Payer: Self-pay | Admitting: Family Medicine

## 2014-01-27 MED ORDER — LISINOPRIL-HYDROCHLOROTHIAZIDE 20-12.5 MG PO TABS: 1.0000 | ORAL_TABLET | Freq: Every day | ORAL | Status: DC

## 2014-01-27 NOTE — Telephone Encounter (Signed)
Refill lisinopril 

## 2014-02-20 ENCOUNTER — Telehealth: Payer: Self-pay | Admitting: Family Medicine

## 2014-02-20 DIAGNOSIS — Z0000 Encounter for general adult medical examination without abnormal findings: Secondary | ICD-10-CM

## 2014-02-20 MED ORDER — DICLOFENAC POTASSIUM 50 MG PO TABS: 50.0000 mg | ORAL_TABLET | Freq: Two times a day (BID) | ORAL | Status: DC | PRN

## 2014-02-20 NOTE — Telephone Encounter (Signed)
-   Refill diclofenac

## 2014-02-20 NOTE — Telephone Encounter (Signed)
Refilled diclofenac. Pt is due for CPE.  Please call pt to arrange.

## 2014-02-23 NOTE — Telephone Encounter (Signed)
Left detailed message informing patient of medication refill and that she is due for a physical

## 2014-02-24 NOTE — Telephone Encounter (Signed)
Patient scheduled cpe for 04/14/14. CPE labs prior. [place] [facility]

## 2014-02-24 NOTE — Addendum Note (Signed)
Addended by: Court JoyFREEMAN, Winfield Caba L on: 02/24/2014 02:11 PM   Modules accepted: Orders

## 2014-02-24 NOTE — Telephone Encounter (Signed)
Please advise labs and diagnosis 

## 2014-02-24 NOTE — Telephone Encounter (Signed)
Needs PE labs lipid, renal, cbc, tsh, hepatic

## 2014-02-24 NOTE — Telephone Encounter (Signed)
Lab order placed.

## 2014-04-08 LAB — CBC
HCT: 43.9 % (ref 36.0–46.0)
Hemoglobin: 15.2 g/dL — ABNORMAL HIGH (ref 12.0–15.0)
MCH: 30.3 pg (ref 26.0–34.0)
MCHC: 34.6 g/dL (ref 30.0–36.0)
MCV: 87.5 fL (ref 78.0–100.0)
Platelets: 283 10*3/uL (ref 150–400)
RBC: 5.02 MIL/uL (ref 3.87–5.11)
RDW: 13.7 % (ref 11.5–15.5)
WBC: 6.7 10*3/uL (ref 4.0–10.5)

## 2014-04-09 LAB — LIPID PANEL
Cholesterol: 184 mg/dL (ref 0–200)
HDL: 58 mg/dL (ref >39)
LDL Cholesterol: 117 mg/dL — ABNORMAL HIGH (ref 0–99)
Total CHOL/HDL Ratio: 3.2 Ratio
Triglycerides: 46 mg/dL (ref <150)
VLDL: 9 mg/dL (ref 0–40)

## 2014-04-09 LAB — RENAL FUNCTION PANEL
Albumin: 4.3 g/dL (ref 3.5–5.2)
BUN: 14 mg/dL (ref 6–23)
CO2: 27 mEq/L (ref 19–32)
Calcium: 9.3 mg/dL (ref 8.4–10.5)
Chloride: 102 mEq/L (ref 96–112)
Creat: 0.67 mg/dL (ref 0.50–1.10)
Glucose, Bld: 96 mg/dL (ref 70–99)
Phosphorus: 3.1 mg/dL (ref 2.3–4.6)
Potassium: 4.3 mEq/L (ref 3.5–5.3)
Sodium: 137 mEq/L (ref 135–145)

## 2014-04-09 LAB — HEPATIC FUNCTION PANEL
ALT: 49 U/L — ABNORMAL HIGH (ref 0–35)
AST: 35 U/L (ref 0–37)
Albumin: 4.3 g/dL (ref 3.5–5.2)
Alkaline Phosphatase: 93 U/L (ref 39–117)
Bilirubin, Direct: 0.1 mg/dL (ref 0.0–0.3)
Indirect Bilirubin: 0.5 mg/dL (ref 0.2–1.2)
Total Bilirubin: 0.6 mg/dL (ref 0.2–1.2)
Total Protein: 7.1 g/dL (ref 6.0–8.3)

## 2014-04-09 LAB — TSH: TSH: 1.594 u[IU]/mL (ref 0.350–4.500)

## 2014-04-14 ENCOUNTER — Ambulatory Visit (INDEPENDENT_AMBULATORY_CARE_PROVIDER_SITE_OTHER): Payer: Managed Care, Other (non HMO) | Admitting: Family Medicine

## 2014-04-14 ENCOUNTER — Encounter: Payer: Self-pay | Admitting: Family Medicine

## 2014-04-14 VITALS — BP 136/80 | HR 60 | Temp 97.5°F | Ht 66.0 in | Wt 199.1 lb

## 2014-04-14 DIAGNOSIS — Z0000 Encounter for general adult medical examination without abnormal findings: Secondary | ICD-10-CM | POA: Diagnosis not present

## 2014-04-14 DIAGNOSIS — B009 Herpesviral infection, unspecified: Secondary | ICD-10-CM

## 2014-04-14 DIAGNOSIS — E785 Hyperlipidemia, unspecified: Secondary | ICD-10-CM

## 2014-04-14 DIAGNOSIS — R748 Abnormal levels of other serum enzymes: Secondary | ICD-10-CM

## 2014-04-14 DIAGNOSIS — I10 Essential (primary) hypertension: Secondary | ICD-10-CM

## 2014-04-14 DIAGNOSIS — Z780 Asymptomatic menopausal state: Secondary | ICD-10-CM

## 2014-04-14 DIAGNOSIS — B001 Herpesviral vesicular dermatitis: Secondary | ICD-10-CM

## 2014-04-14 MED ORDER — ACYCLOVIR 5 % EX CREA: 1.0000 "application " | TOPICAL_CREAM | CUTANEOUS | Status: DC

## 2014-04-14 NOTE — Patient Instructions (Signed)
DASH Diet  The DASH diet stands for "Dietary Approaches to Stop Hypertension." It is a healthy eating plan that has been shown to reduce high blood pressure (hypertension) in as little as 14 days, while also possibly providing other significant health benefits. These other health benefits include reducing the risk of breast cancer after menopause and reducing the risk of type 2 diabetes, heart disease, colon cancer, and stroke. Health benefits also include weight loss and slowing kidney failure in patients with chronic kidney disease.   DIET GUIDELINES  · Limit salt (sodium). Your diet should contain less than 1500 mg of sodium daily.  · Limit refined or processed carbohydrates. Your diet should include mostly whole grains. Desserts and added sugars should be used sparingly.  · Include small amounts of heart-healthy fats. These types of fats include nuts, oils, and tub margarine. Limit saturated and trans fats. These fats have been shown to be harmful in the body.  CHOOSING FOODS   The following food groups are based on a 2000 calorie diet. See your Registered Dietitian for individual calorie needs.  Grains and Grain Products (6 to 8 servings daily)  · Eat More Often: Whole-wheat bread, brown rice, whole-grain or wheat pasta, quinoa, popcorn without added fat or salt (air popped).  · Eat Less Often: White bread, white pasta, white rice, cornbread.  Vegetables (4 to 5 servings daily)  · Eat More Often: Fresh, frozen, and canned vegetables. Vegetables may be raw, steamed, roasted, or grilled with a minimal amount of fat.  · Eat Less Often/Avoid: Creamed or fried vegetables. Vegetables in a cheese sauce.  Fruit (4 to 5 servings daily)  · Eat More Often: All fresh, canned (in natural juice), or frozen fruits. Dried fruits without added sugar. One hundred percent fruit juice (½ cup [237 mL] daily).  · Eat Less Often: Dried fruits with added sugar. Canned fruit in light or heavy syrup.  Lean Meats, Fish, and Poultry (2  servings or less daily. One serving is 3 to 4 oz [85-114 g]).  · Eat More Often: Ninety percent or leaner ground beef, tenderloin, sirloin. Round cuts of beef, chicken breast, turkey breast. All fish. Grill, bake, or broil your meat. Nothing should be fried.  · Eat Less Often/Avoid: Fatty cuts of meat, turkey, or chicken leg, thigh, or wing. Fried cuts of meat or fish.  Dairy (2 to 3 servings)  · Eat More Often: Low-fat or fat-free milk, low-fat plain or light yogurt, reduced-fat or part-skim cheese.  · Eat Less Often/Avoid: Milk (whole, 2%). Whole milk yogurt. Full-fat cheeses.  Nuts, Seeds, and Legumes (4 to 5 servings per week)  · Eat More Often: All without added salt.  · Eat Less Often/Avoid: Salted nuts and seeds, canned beans with added salt.  Fats and Sweets (limited)  · Eat More Often: Vegetable oils, tub margarines without trans fats, sugar-free gelatin. Mayonnaise and salad dressings.  · Eat Less Often/Avoid: Coconut oils, palm oils, butter, stick margarine, cream, half and half, cookies, candy, pie.  FOR MORE INFORMATION  The Dash Diet Eating Plan: www.dashdiet.org  Document Released: 10/19/2011 Document Revised: 01/22/2012 Document Reviewed: 10/19/2011  [employer]® Patient Information ©2014 [employer].

## 2014-04-14 NOTE — Progress Notes (Signed)
Pre visit review using our clinic review tool, if applicable. No additional management support is needed unless otherwise documented below in the visit note. 

## 2014-04-14 NOTE — Progress Notes (Signed)
Patient ID: Jeanette Mckee, female   DOB: 09-24-1951, 63 y.o.   MRN: [id number] NYELAH VALAZQUEZ [id number] 19-Jan-1951 04/14/2014      Progress Note-Follow Up  Subjective  Chief Complaint  Chief Complaint  Patient presents with  . Annual Exam    physical    HPI  Patient is a 63 year old female in today for routine medical care. She is in today for annual exam. Really doing well but she does note poor sleep. She feels she needs her hormones if she has to many hot flashes. No recent illness. Last colonoscopy in 2006. Denies CP/palp/SOB/HA/congestion/fevers/GI or GU c/o. Taking meds as prescribed  Past Medical History  Diagnosis Date  . History of chicken pox     childhood  . Hypertension   . Measles as a child  . Other and unspecified hyperlipidemia 02/11/2013  . Preventative health care 02/11/2013    Past Surgical History  Procedure Laterality Date  . Abdominal hysterectomy  12/1994    and RSO & LS    Family History  Problem Relation Age of Onset  . Alcohol abuse Father   . Heart disease Father   . Stroke Father   . Hypertension Father   . Diabetes Father     type 2  . Cancer Father     prostate  . Breast cancer Neg Hx   . Colon cancer Neg Hx   . Heart disease Mother   . Hypertension Mother   . Arthritis Mother   . Heart attack Mother 36  . Crohn's disease Mother   . Cancer Maternal Grandmother     bone- nonsmoker  . Heart disease Son     MI s/p 3 stents, January 2015    History   Social History  . Marital Status: Married    Spouse Name: N/A    Number of Children: N/A  . Years of Education: N/A   Occupational History  . Not on file.   Social History Main Topics  . Smoking status: Former Smoker -- 20 years    Quit date: 11/13/2001  . Smokeless tobacco: Never Used     Comment: quit in 2003 1 ppd for 20 years  . Alcohol Use: Yes  . Drug Use: No  . Sexual Activity: Yes     Comment: lives with husband, retired from church, [place]. eats a heart healthy diet,  minimal meats.exercises 6 days a week   Other Topics Concern  . Not on file   Social History Narrative  . No narrative on file    Current Outpatient Prescriptions on File Prior to Visit  Medication Sig Dispense Refill  . b complex vitamins tablet Take 1 tablet by mouth daily.      . Cholecalciferol (VITAMIN D PO) Take 1 capsule by mouth daily.      . diclofenac (CATAFLAM) 50 MG tablet Take 1 tablet (50 mg total) by mouth 2 (two) times daily as needed.  180 tablet  1  . estrogen-methylTESTOSTERone (EST ESTROGENS-METHYLTEST HS) 0.625-1.25 MG per tablet Take 1 tablet by mouth daily.  90 tablet  3  . lisinopril-hydrochlorothiazide (PRINZIDE,ZESTORETIC) 20-12.5 MG per tablet Take 1 tablet by mouth daily.  90 tablet  0  . Multiple Vitamin (MULTIVITAMIN) tablet Take 1 tablet by mouth daily.      . Omega-3 Fatty Acids (FISH OIL) 1000 MG CAPS Take 1 capsule by mouth daily.      . Vitamin Mixture (VITAMIN E COMPLETE PO) Take 1 capsule by  mouth daily.       No current facility-administered medications on file prior to visit.    No Known Allergies  Review of Systems  Review of Systems  Constitutional: Negative for fever, chills and malaise/fatigue.  HENT: Negative for congestion, hearing loss and nosebleeds.   Eyes: Negative for discharge.  Respiratory: Negative for cough, sputum production, shortness of breath and wheezing.   Cardiovascular: Negative for chest pain, palpitations and leg swelling.  Gastrointestinal: Negative for heartburn, nausea, vomiting, abdominal pain, diarrhea, constipation and blood in stool.  Genitourinary: Negative for dysuria, urgency, frequency and hematuria.  Musculoskeletal: Negative for back pain, falls and myalgias.  Skin: Negative for rash.  Neurological: Negative for dizziness, tremors, sensory change, focal weakness, loss of consciousness, weakness and headaches.  Endo/Heme/Allergies: Negative for polydipsia. Does not bruise/bleed easily.   Psychiatric/Behavioral: Negative for depression and suicidal ideas. The patient is not nervous/anxious and does not have insomnia.     Objective  BP 162/78  Pulse 60  Temp(Src) 97.5 F (36.4 C) (Oral)  Ht 5\' 6"  (1.676 m)  Wt 199 lb 1.3 oz (90.302 kg)  BMI 32.15 kg/m2  SpO2 97%  Physical Exam  Physical Exam  Constitutional: She is oriented to person, place, and time and well-developed, well-nourished, and in no distress. No distress.  HENT:  Head: Normocephalic and atraumatic.  Right Ear: External ear normal.  Left Ear: External ear normal.  Nose: Nose normal.  Mouth/Throat: Oropharynx is clear and moist. No oropharyngeal exudate.  Eyes: Conjunctivae are normal. Pupils are equal, round, and reactive to light. Right eye exhibits no discharge. Left eye exhibits no discharge. No scleral icterus.  Neck: Normal range of motion. Neck supple. No thyromegaly present.  Cardiovascular: Normal rate, regular rhythm, normal heart sounds and intact distal pulses.   No murmur heard. Pulmonary/Chest: Effort normal and breath sounds normal. No respiratory distress. She has no wheezes. She has no rales.  Abdominal: Soft. Bowel sounds are normal. She exhibits no distension and no mass. There is no tenderness.  Musculoskeletal: Normal range of motion. She exhibits no edema and no tenderness.  Lymphadenopathy:    She has no cervical adenopathy.  Neurological: She is alert and oriented to person, place, and time. She has normal reflexes. No cranial nerve deficit. Coordination normal.  Skin: Skin is warm and dry. No rash noted. She is not diaphoretic.  Psychiatric: Mood, memory and affect normal.    Lab Results  Component Value Date   TSH 1.594 04/08/2014   Lab Results  Component Value Date   WBC 6.7 04/08/2014   HGB 15.2* 04/08/2014   HCT 43.9 04/08/2014   MCV 87.5 04/08/2014   PLT 283 04/08/2014   Lab Results  Component Value Date   CREATININE 0.67 04/08/2014   BUN 14 04/08/2014   NA 137  04/08/2014   K 4.3 04/08/2014   CL 102 04/08/2014   CO2 27 04/08/2014   Lab Results  Component Value Date   ALT 49* 04/08/2014   AST 35 04/08/2014   ALKPHOS 93 04/08/2014   BILITOT 0.6 04/08/2014   Lab Results  Component Value Date   CHOL 184 04/08/2014   Lab Results  Component Value Date   HDL 58 04/08/2014   Lab Results  Component Value Date   LDLCALC 117* 04/08/2014   Lab Results  Component Value Date   TRIG 46 04/08/2014   Lab Results  Component Value Date   CHOLHDL 3.2 04/08/2014     Assessment & Plan  HTN (hypertension) Well controlled, no changes to meds. Encouraged heart healthy diet such as the DASH diet and exercise as tolerated. Improved on recheck  Preventative health care Patient encouraged to maintain heart healthy diet, regular exercise, adequate sleep. Consider daily probiotics. Take medications as prescribed  Other and unspecified hyperlipidemia Encouraged heart healthy diet, increase exercise, avoid trans fats, consider a krill oil cap daily  Abnormal liver enzymes Mild. Encouraged DASH diet, decrease po intake and increase exercise as tolerated. Needs 7-8 hours of sleep nightly. Avoid trans fats, eat small, frequent meals every 4-5 hours with lean proteins, complex carbs and healthy fats. Minimize simple carbs, GMO foods.  Cold sore Given rx for Zovirax to use prn  Postmenopausal estrogen deficiency May continue hormones since she is aware of the risks. Bone Densitometry ordered today. Poor sleep noted. Encouraged good sleep hygiene such as dark, quiet room. No blue/green glowing lights such as computer screens in bedroom. No alcohol or stimulants in evening. Cut down on caffeine as able. Regular exercise is helpful but not just prior to bed time. May try Melatonin

## 2014-04-15 ENCOUNTER — Encounter: Payer: Self-pay | Admitting: Family Medicine

## 2014-04-19 DIAGNOSIS — Z780 Asymptomatic menopausal state: Secondary | ICD-10-CM | POA: Insufficient documentation

## 2014-04-19 DIAGNOSIS — B001 Herpesviral vesicular dermatitis: Secondary | ICD-10-CM | POA: Insufficient documentation

## 2014-04-19 NOTE — Assessment & Plan Note (Signed)
Well controlled, no changes to meds. Encouraged heart healthy diet such as the DASH diet and exercise as tolerated. Improved on recheck 

## 2014-04-19 NOTE — Assessment & Plan Note (Signed)
Encouraged heart healthy diet, increase exercise, avoid trans fats, consider a krill oil cap daily 

## 2014-04-19 NOTE — Assessment & Plan Note (Signed)
Patient encouraged to maintain heart healthy diet, regular exercise, adequate sleep. Consider daily probiotics. Take medications as prescribed 

## 2014-04-19 NOTE — Assessment & Plan Note (Signed)
May continue hormones since she is aware of the risks. Bone Densitometry ordered today. Poor sleep noted. Encouraged good sleep hygiene such as dark, quiet room. No blue/green glowing lights such as computer screens in bedroom. No alcohol or stimulants in evening. Cut down on caffeine as able. Regular exercise is helpful but not just prior to bed time. May try Melatonin

## 2014-04-19 NOTE — Assessment & Plan Note (Signed)
Given rx for Zovirax to use prn

## 2014-04-19 NOTE — Assessment & Plan Note (Signed)
Mild. Encouraged DASH diet, decrease po intake and increase exercise as tolerated. Needs 7-8 hours of sleep nightly. Avoid trans fats, eat small, frequent meals every 4-5 hours with lean proteins, complex carbs and healthy fats. Minimize simple carbs, GMO foods.

## 2014-04-24 ENCOUNTER — Other Ambulatory Visit: Payer: Self-pay | Admitting: *Deleted

## 2014-04-24 MED ORDER — EST ESTROGENS-METHYLTEST 0.625-1.25 MG PO TABS: 1.0000 | ORAL_TABLET | Freq: Every day | ORAL | Status: DC

## 2014-04-24 NOTE — Telephone Encounter (Signed)
[facility]ed [facility] ESTR[facility]ST HS Last filled by MD on 12/01/13 X 1 YEAR.  RX expires in 6 months due to controlled substance. Last AEX - 12/01/13 Next AEX - not scheduled  Please approve new 6 month supply and I will fax to pharmacy.

## 2014-04-27 NOTE — Telephone Encounter (Signed)
Faxed by [facility] on 03/24/14.

## 2014-04-30 ENCOUNTER — Other Ambulatory Visit: Payer: Self-pay | Admitting: *Deleted

## 2014-04-30 NOTE — Telephone Encounter (Signed)
[facility]  Last filled by MD on 12/01/13 X 1 YEAR. RX expires in 6 months due to controlled substance.  Last AEX - 12/01/13  Next AEX - not scheduled   [facility] but [facility] to [facility].  [facility]. Please approve refills and I will [facility].

## 2014-04-30 NOTE — Telephone Encounter (Signed)
Patient needs mammogram done before we can refill this. Was it done in the last year, and we do not have the results yet?

## 2014-05-04 MED ORDER — EST ESTROGENS-METHYLTEST 0.625-1.25 MG PO TABS: 1.0000 | ORAL_TABLET | Freq: Every day | ORAL | Status: DC

## 2014-05-04 NOTE — Telephone Encounter (Signed)
I spoke to the patient and she states [facility] and bone density.  States she has a lot going on and may not be able to get MMG done for another 3-4 weeks.  Advised we can only give 30 day supplyPt states she was told by another Shali Vesey she did not need to make an appt, so she will do walk-in visit when she has a chance.  Advised pt again we can only give 30 day supply until we have MMG results.  Pt is agreeable with this.  I spoke to [place] at [facility]iology [facility].  She states they would prefer pt makes appt as tech is not in the office everyday.  They do not do bone density tests at all. I called pt second time to let her know the hours the tech is in the office and about bone density test.  Pt is very appreciative of information and that I called to get this information for her.  Is this OK to fill for 30 days?  Please advise.

## 2014-05-04 NOTE — Telephone Encounter (Signed)
Patietn is calling about  The refill said she only has 3 days left.

## 2014-05-04 NOTE — Telephone Encounter (Signed)
Jeanette Mckee,  Please contact patient to get her mammogram scheduled if it was not done in the last year.  Our records indicate her last mammogram was almost 2 years ago. Is this correct? We can refill her hormones short term only if the mammogram is scheduled. We will refill the rest after results are back.   Thank you!  Cc- [name]

## 2014-05-05 MED ORDER — EST ESTROGENS-METHYLTEST 0.625-1.25 MG PO TABS: 1.0000 | ORAL_TABLET | Freq: Every day | ORAL | Status: DC

## 2014-05-05 NOTE — Telephone Encounter (Signed)
RX signed by PGrubb and faxed by this CMA to [facility].

## 2014-05-05 NOTE — Addendum Note (Signed)
Addended by: Luisa DagoPHILLIPS, STEPHANIE C on: 05/05/2014 05:04 PM   Modules accepted: Orders

## 2014-05-05 NOTE — Telephone Encounter (Signed)
Only 30 days and no more at all if no Mammo.

## 2014-05-05 NOTE — Telephone Encounter (Signed)
RX did not print on 05/04/14.  Reprinting RX.

## 2014-05-06 ENCOUNTER — Other Ambulatory Visit: Payer: Self-pay | Admitting: Nurse Practitioner

## 2014-05-06 DIAGNOSIS — Z1231 Encounter for screening mammogram for malignant neoplasm of breast: Secondary | ICD-10-CM

## 2014-05-06 NOTE — Telephone Encounter (Addendum)
PC from patient this AM.  Pt states she received phone call from [facility] pharmacy and insurance will not pay for 30 day supply through local pharmacy.  30 day RX [facility].  Pt states she has three tablets left.    [facility], but tech is on vacation.  Pt has made appt for Monday for MMG.  Pt aware it will take a few days for refill to arrive from mail order.  Advised pt I will call her if we have any problems with RX.  Pt is agreeable and appreciative of our help with this prescription.

## 2014-05-07 NOTE — Telephone Encounter (Signed)
PC from pt stating that she has [facility] to [facility] this AM and they do not have the RX for Estratest.  Pt would like our office to call [facility] and have this RX expedited as she now says that she is out of medication. I [facility] with [facility] at [facility].  Verbal RX given and asked Amy to expedite this order for patient. Amy verifies RX and states they will begin processing.

## 2014-05-11 ENCOUNTER — Ambulatory Visit (HOSPITAL_BASED_OUTPATIENT_CLINIC_OR_DEPARTMENT_OTHER)
Admission: RE | Admit: 2014-05-11 | Discharge: 2014-05-11 | Disposition: A | Discharge status: Home / Self Care | Payer: Managed Care, Other (non HMO) | Source: Ambulatory Visit | Attending: Nurse Practitioner | Admitting: Nurse Practitioner

## 2014-05-11 DIAGNOSIS — Z1231 Encounter for screening mammogram for malignant neoplasm of breast: Secondary | ICD-10-CM | POA: Insufficient documentation

## 2014-05-19 ENCOUNTER — Other Ambulatory Visit: Payer: Managed Care, Other (non HMO)

## 2014-06-05 ENCOUNTER — Other Ambulatory Visit: Payer: Self-pay

## 2014-06-05 MED ORDER — EST ESTROGENS-METHYLTEST 0.625-1.25 MG PO TABS: 1.0000 | ORAL_TABLET | Freq: Every day | ORAL | Status: DC

## 2014-06-05 NOTE — Telephone Encounter (Signed)
Last AEX: 12/01/13 Last refill:05/05/24 #30, 0 rfs until MMG Current AEX:not schedule MMG: 05/18/14: BI-RADS Negs  Please advise

## 2014-06-08 MED ORDER — EST ESTROGENS-METHYLTEST 0.625-1.25 MG PO TABS: 1.0000 | ORAL_TABLET | Freq: Every day | ORAL | Status: DC

## 2014-06-08 NOTE — Addendum Note (Signed)
Addended by: Brooks SailorsHARGROVE, Delorus Langwell S on: 06/08/2014 03:29 PM   Modules accepted: Orders

## 2014-06-08 NOTE — Telephone Encounter (Signed)
rx sent to wrong pharmacy. Called pharmacy and canceled rx. [facility]

## 2014-07-21 ENCOUNTER — Other Ambulatory Visit: Payer: Self-pay | Admitting: Nurse Practitioner

## 2014-07-21 MED ORDER — EST ESTROGENS-METHYLTEST 0.625-1.25 MG PO TABS: 1.0000 | ORAL_TABLET | Freq: Every day | ORAL | Status: DC

## 2014-07-21 NOTE — Telephone Encounter (Signed)
Pt needs refill for   estrogen-methylTESTOSTERone (EST ESTROGENS-METHYLTEST HS) 0.625-1.25 MG per tablet  Take 1 tablet by mouth daily., Starting 06/08/2014, Until Discontinued, Print, Last Dose: Not Recorded  Refills: 0 ordered [facility]: [facility] [place] - [address]   Pt asking if she can get 90 day supply because it is cheaper

## 2014-07-21 NOTE — Telephone Encounter (Signed)
Last refilled: 06/08/14 #30/0 refills  Last AEX: 12/01/13 with Ms. Patty AEX Scheduled: no current AEX scheduled  Last Mammogram: 05/18/14 Bi-Rads 1  (Patient requesting 3 months at a time)  Please Advise.

## 2014-07-22 NOTE — Telephone Encounter (Signed)
RX printed and signed by Ms. Patty and was faxed to [facility], LM on patient's VM that rx has been sent.

## 2014-07-24 ENCOUNTER — Other Ambulatory Visit: Payer: Self-pay | Admitting: Family Medicine

## 2014-09-14 ENCOUNTER — Encounter: Payer: Self-pay | Admitting: Family Medicine

## 2014-10-23 ENCOUNTER — Other Ambulatory Visit: Payer: Self-pay | Admitting: Family Medicine

## 2014-10-23 NOTE — Telephone Encounter (Signed)
Pt was due for follow up with Dr Abner GreenspanBlyth on 10/14/14.  90 day supply of amlodipine sent to pharmacy. Please call pt to arrange follow up soon.

## 2015-01-26 DIAGNOSIS — E782 Mixed hyperlipidemia: Secondary | ICD-10-CM | POA: Diagnosis not present

## 2015-01-26 DIAGNOSIS — R7989 Other specified abnormal findings of blood chemistry: Secondary | ICD-10-CM | POA: Diagnosis not present

## 2015-01-26 DIAGNOSIS — I10 Essential (primary) hypertension: Secondary | ICD-10-CM | POA: Diagnosis not present

## 2015-01-26 DIAGNOSIS — M545 Low back pain: Secondary | ICD-10-CM | POA: Diagnosis not present

## 2015-01-26 DIAGNOSIS — Z79899 Other long term (current) drug therapy: Secondary | ICD-10-CM | POA: Diagnosis not present

## 2015-02-01 ENCOUNTER — Telehealth: Payer: Self-pay | Admitting: Nurse Practitioner

## 2015-02-24 DIAGNOSIS — I10 Essential (primary) hypertension: Secondary | ICD-10-CM | POA: Diagnosis not present

## 2015-02-24 DIAGNOSIS — M5416 Radiculopathy, lumbar region: Secondary | ICD-10-CM | POA: Diagnosis not present

## 2015-03-11 DIAGNOSIS — M4806 Spinal stenosis, lumbar region: Secondary | ICD-10-CM | POA: Diagnosis not present

## 2015-03-11 DIAGNOSIS — M4316 Spondylolisthesis, lumbar region: Secondary | ICD-10-CM | POA: Diagnosis not present

## 2015-03-11 DIAGNOSIS — Z6830 Body mass index (BMI) 30.0-30.9, adult: Secondary | ICD-10-CM | POA: Diagnosis not present

## 2015-03-11 DIAGNOSIS — M5416 Radiculopathy, lumbar region: Secondary | ICD-10-CM | POA: Diagnosis not present

## 2015-03-11 DIAGNOSIS — I10 Essential (primary) hypertension: Secondary | ICD-10-CM | POA: Diagnosis not present

## 2015-06-04 DIAGNOSIS — M47816 Spondylosis without myelopathy or radiculopathy, lumbar region: Secondary | ICD-10-CM | POA: Diagnosis not present

## 2015-06-04 DIAGNOSIS — M4317 Spondylolisthesis, lumbosacral region: Secondary | ICD-10-CM | POA: Diagnosis not present

## 2015-06-04 DIAGNOSIS — M4806 Spinal stenosis, lumbar region: Secondary | ICD-10-CM | POA: Diagnosis not present

## 2015-08-05 DIAGNOSIS — M4317 Spondylolisthesis, lumbosacral region: Secondary | ICD-10-CM | POA: Diagnosis not present

## 2015-08-05 DIAGNOSIS — M47816 Spondylosis without myelopathy or radiculopathy, lumbar region: Secondary | ICD-10-CM | POA: Diagnosis not present

## 2015-08-05 DIAGNOSIS — M4806 Spinal stenosis, lumbar region: Secondary | ICD-10-CM | POA: Diagnosis not present

## 2016-02-03 DIAGNOSIS — E782 Mixed hyperlipidemia: Secondary | ICD-10-CM | POA: Diagnosis not present

## 2016-02-03 DIAGNOSIS — Z23 Encounter for immunization: Secondary | ICD-10-CM | POA: Diagnosis not present

## 2016-02-03 DIAGNOSIS — I10 Essential (primary) hypertension: Secondary | ICD-10-CM | POA: Diagnosis not present

## 2016-02-03 DIAGNOSIS — Z79899 Other long term (current) drug therapy: Secondary | ICD-10-CM | POA: Diagnosis not present

## 2016-02-03 DIAGNOSIS — Z8249 Family history of ischemic heart disease and other diseases of the circulatory system: Secondary | ICD-10-CM | POA: Diagnosis not present

## 2016-02-03 DIAGNOSIS — Z1389 Encounter for screening for other disorder: Secondary | ICD-10-CM | POA: Diagnosis not present

## 2016-03-31 ENCOUNTER — Telehealth: Payer: Self-pay | Admitting: Family Medicine

## 2016-03-31 NOTE — Telephone Encounter (Signed)
Pt called in to get established with Dr. Patsy Lageropland because she says that she switched PCP's to a different location. She says that she came in to an appt with her mother in law to see Dr. Patsy Lageropland and she was impressed with her.    Because our system is showing PCP as Abner GreenspanBlyth I want to be sure to make PCP aware.

## 2016-04-02 NOTE — Telephone Encounter (Signed)
Fine with me if [facility] with Dr Patsy Lageropland

## 2016-04-04 NOTE — Telephone Encounter (Signed)
Pt has been scheduled to be established with provider Copland

## 2016-04-25 ENCOUNTER — Encounter: Payer: Self-pay | Admitting: *Deleted

## 2016-04-25 ENCOUNTER — Telehealth: Payer: Self-pay | Admitting: *Deleted

## 2016-04-25 NOTE — Telephone Encounter (Signed)
Unable to reach patient at time of pre-visit call. Left message for patient to return call when available.  

## 2016-04-25 NOTE — Telephone Encounter (Signed)
Pre-Visit Call completed with patient and chart updated.   Pre-Visit Info documented in Specialty Comments under SnapShot.    

## 2016-04-26 ENCOUNTER — Ambulatory Visit (INDEPENDENT_AMBULATORY_CARE_PROVIDER_SITE_OTHER): Payer: Commercial Managed Care - HMO | Admitting: Family Medicine

## 2016-04-26 ENCOUNTER — Encounter: Payer: Self-pay | Admitting: Family Medicine

## 2016-04-26 VITALS — BP 160/90 | HR 66 | Temp 98.2°F | Ht 66.0 in | Wt 184.0 lb

## 2016-04-26 DIAGNOSIS — Z1329 Encounter for screening for other suspected endocrine disorder: Secondary | ICD-10-CM

## 2016-04-26 DIAGNOSIS — Z1211 Encounter for screening for malignant neoplasm of colon: Secondary | ICD-10-CM

## 2016-04-26 DIAGNOSIS — Z131 Encounter for screening for diabetes mellitus: Secondary | ICD-10-CM

## 2016-04-26 DIAGNOSIS — Z130 Encounter for screening for diseases of the blood and blood-forming organs and certain disorders involving the immune mechanism: Secondary | ICD-10-CM | POA: Diagnosis not present

## 2016-04-26 DIAGNOSIS — I10 Essential (primary) hypertension: Secondary | ICD-10-CM

## 2016-04-26 DIAGNOSIS — H524 Presbyopia: Secondary | ICD-10-CM | POA: Diagnosis not present

## 2016-04-26 DIAGNOSIS — H5210 Myopia, unspecified eye: Secondary | ICD-10-CM | POA: Diagnosis not present

## 2016-04-26 DIAGNOSIS — Z0000 Encounter for general adult medical examination without abnormal findings: Secondary | ICD-10-CM | POA: Diagnosis not present

## 2016-04-26 DIAGNOSIS — N952 Postmenopausal atrophic vaginitis: Secondary | ICD-10-CM

## 2016-04-26 DIAGNOSIS — Z13220 Encounter for screening for lipoid disorders: Secondary | ICD-10-CM

## 2016-04-26 LAB — CBC
HCT: 42.9 % (ref 36.0–46.0)
Hemoglobin: 14.5 g/dL (ref 12.0–15.0)
MCHC: 33.9 g/dL (ref 30.0–36.0)
MCV: 88.8 fl (ref 78.0–100.0)
Platelets: 285 10*3/uL (ref 150.0–400.0)
RBC: 4.83 Mil/uL (ref 3.87–5.11)
RDW: 13.6 % (ref 11.5–15.5)
WBC: 7.3 10*3/uL (ref 4.0–10.5)

## 2016-04-26 LAB — LIPID PANEL
Cholesterol: 206 mg/dL — ABNORMAL HIGH (ref 0–200)
HDL: 79.2 mg/dL
LDL Cholesterol: 118 mg/dL — ABNORMAL HIGH (ref 0–99)
NonHDL: 127.25
Total CHOL/HDL Ratio: 3
Triglycerides: 44 mg/dL (ref 0.0–149.0)
VLDL: 8.8 mg/dL (ref 0.0–40.0)

## 2016-04-26 LAB — COMPREHENSIVE METABOLIC PANEL
ALT: 34 U/L (ref 0–35)
AST: 28 U/L (ref 0–37)
Albumin: 4.3 g/dL (ref 3.5–5.2)
Alkaline Phosphatase: 106 U/L (ref 39–117)
BUN: 22 mg/dL (ref 6–23)
CO2: 29 mEq/L (ref 19–32)
Calcium: 9.7 mg/dL (ref 8.4–10.5)
Chloride: 103 mEq/L (ref 96–112)
Creatinine, Ser: 0.67 mg/dL (ref 0.40–1.20)
GFR: 93.4 mL/min (ref >60.00)
Glucose, Bld: 108 mg/dL — ABNORMAL HIGH (ref 70–99)
Potassium: 4.9 mEq/L (ref 3.5–5.1)
Sodium: 139 mEq/L (ref 135–145)
Total Bilirubin: 0.5 mg/dL (ref 0.2–1.2)
Total Protein: 7.8 g/dL (ref 6.0–8.3)

## 2016-04-26 LAB — TSH: TSH: 0.89 u[IU]/mL (ref 0.35–4.50)

## 2016-04-26 MED ORDER — LISINOPRIL-HYDROCHLOROTHIAZIDE 20-12.5 MG PO TABS: 2.0000 | ORAL_TABLET | Freq: Every day | ORAL | Status: DC

## 2016-04-26 MED ORDER — ESTRADIOL 0.1 MG/GM VA CREA: TOPICAL_CREAM | VAGINAL | Status: DC

## 2016-04-26 NOTE — Progress Notes (Signed)
Pre visit review using our clinic review tool, if applicable. No additional management support is needed unless otherwise documented below in the visit note. 

## 2016-04-26 NOTE — Progress Notes (Signed)
[facility] [address], [address] [place], [place] [address] [phone]  Date:  04/26/2016   Name:  Jeanette Mckee   DOB:  1951-08-20   MRN:  [id number]  PCP:  Abbe Amsterdam, MD    Chief Complaint: Establish Care   History of Present Illness:  Jeanette Mckee is a 65 y.o. very pleasant female patient who presents with the following:  Here today to establish care and to have a CPE.  Her MIL who was also my patient recently passed away She had been getting her care at [facility] recently but she was personal friends with her PCP and would prefer to see me instead  S/p total hyst, 1996. Done for fibroids, no GYN cancers. She stopped seeing OBG.  She stopped her hormone medications about 2 years ago. She went though some hot flashes - now resolved- but now has pain with intercourse.   She did try topical estrogens in the past and did not have as much success for her as she did with HRT  She is married, has 2 children ages 55 and 72, she has 8 grandchildren and 2 great grands and 1 on the way.  She does not check her BP at home. The last few times her BP has been checked it has been running high. She has been on the same dose of BP for 15 years or so.  She quit smoking 14 years ago.   She does exercise- mostly walking  She is due for labs - she is fasting today for labs  Patient Active Problem List   Diagnosis Date Noted  . Cold sore 04/19/2014  . 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 Other and unspecified hyperlipidemia 02/11/2013  . Preventative health care 02/11/2013  . HTN (hypertension) 02/04/2012  . Abnormal liver enzymes 02/04/2012  . Chronic back pain 02/04/2012    Past Medical History  Diagnosis Date  . History of chicken pox     childhood  . Hypertension   . Measles as a child  . Other and unspecified hyperlipidemia 02/11/2013  . Preventative health care 02/11/2013    Past Surgical History  Procedure  Laterality Date  . Abdominal hysterectomy  12/1994    and RSO & LS    Social History  Substance Use Topics  . Smoking status: Former Smoker -- 20 years    Quit date: 11/13/2001  . Smokeless tobacco: Never Used     Comment: quit in 2003 1 ppd for 20 years  . Alcohol Use: Yes    Family History  Problem Relation Age of Onset  . Alcohol abuse Father   . Heart disease Father   . Stroke Father   . Hypertension Father   . Diabetes Father     type 2  . Cancer Father     prostate  . Breast cancer Neg Hx   . Colon cancer Neg Hx   . Heart disease Mother   . Hypertension Mother   . Arthritis Mother   . Heart attack Mother 13  . Crohn's disease Mother   . Cancer Maternal Grandmother     bone- nonsmoker  . Heart disease Son     MI s/p 3 stents, January 2015    No Known Allergies  Medication list has been reviewed and updated.  Current Outpatient Prescriptions on File Prior to Visit  Medication Sig Dispense Refill  . b complex vitamins tablet Take 1 tablet by  mouth daily.    . diclofenac (CATAFLAM) 50 MG tablet Take 1 tablet (50 mg total) by mouth 2 (two) times daily as needed. (Patient taking differently: Take 50 mg by mouth daily. ) 180 tablet 1  . lisinopril-hydrochlorothiazide (PRINZIDE,ZESTORETIC) 20-12.5 MG per tablet TAKE 1 TABLET BY MOUTH EVERY DAY 90 tablet 0  . Multiple Vitamin (MULTIVITAMIN) tablet Take 1 tablet by mouth daily.    . Omega-3 Fatty Acids (FISH OIL) 1000 MG CAPS Take 1 capsule by mouth daily.     No current facility-administered medications on file prior to visit.    Review of Systems:  As per HPI- otherwise negative.   Physical Examination: Filed Vitals:   04/26/16 0836 04/26/16 0844  BP: 165/66 154/63  Pulse: 66   Temp: 98.2 F (36.8 C)    Filed Vitals:   04/26/16 0836  Height: 5\' 6"  (1.676 m)  Weight: 184 lb (83.462 kg)   Body mass index is 29.71 kg/(m^2). Ideal Body Weight: Weight in (lb) to have BMI = 25: 154.6  GEN: WDWN, NAD,  Non-toxic, A & O x 3, overweight, looks well HEENT: Atraumatic, Normocephalic. Neck supple. No masses, No LAD. Bilateral TM wnl, oropharynx normal.  PEERL,EOMI.   Ears and Nose: No external deformity. CV: RRR, No M/G/R. No JVD. No thrill. No extra heart sounds. PULM: CTA B, no wheezes, crackles, rhonchi. No retractions. No resp. distress. No accessory muscle use. ABD: S, NT, ND. No rebound. No HSM. EXTR: No c/c/e NEURO Normal gait.  PSYCH: Normally interactive. Conversant. Not depressed or anxious appearing.  Calm demeanor.    Assessment and Plan: Physical exam  Colon cancer screening - Plan: Ambulatory referral to Gastroenterology  Accelerated hypertension - Plan: lisinopril-hydrochlorothiazide (PRINZIDE,ZESTORETIC) 20-12.5 MG tablet  Screening for hyperlipidemia - Plan: Lipid panel  Screening for thyroid disorder - Plan: TSH  Screening for diabetes mellitus - Plan: Comprehensive metabolic panel  Vaginal atrophy - Plan: estradiol (ESTRACE VAGINAL) 0.1 MG/GM vaginal cream  Screening for deficiency anemia - Plan: CBC  Labs pending as above Will increase her BP medication dose Trial of vaginal estrogen- she will let me know how this does for her We have requested records from [facility]  it was nice to see you today- I am sorry for the loss of your mother in law.  Increase your blood pressure med to 1.5 tablets daily; if your BP continues to run higher than 140/90 increase to 2 pills a day and let me know I did a referral to Dr. Loreta AveMann for you Try the estrogen cream as directed- let me know how this does for you!    Please see me in 3-4 months for a blood pressure check   Signed Abbe AmsterdamJessica Copland, MD

## 2016-04-26 NOTE — Patient Instructions (Addendum)
It was nice to see you today- I am sorry for the loss of your mother in law.  Increase your blood pressure med to 1.5 tablets daily; if your BP continues to run higher than 140/90 increase to 2 pills a day and let me know I did a referral to Dr. Loreta AveMann for you Try the estrogen cream as directed- let me know how this does for you!    Please see me in 3-4 months for a blood pressure check

## 2016-05-25 DIAGNOSIS — Z1211 Encounter for screening for malignant neoplasm of colon: Secondary | ICD-10-CM | POA: Diagnosis not present

## 2016-05-25 DIAGNOSIS — E669 Obesity, unspecified: Secondary | ICD-10-CM | POA: Diagnosis not present

## 2016-05-25 DIAGNOSIS — K7581 Nonalcoholic steatohepatitis (NASH): Secondary | ICD-10-CM | POA: Diagnosis not present

## 2016-06-05 DIAGNOSIS — Z1212 Encounter for screening for malignant neoplasm of rectum: Secondary | ICD-10-CM | POA: Diagnosis not present

## 2016-06-05 DIAGNOSIS — Z1211 Encounter for screening for malignant neoplasm of colon: Secondary | ICD-10-CM | POA: Diagnosis not present

## 2016-06-06 LAB — COLOGUARD: Cologuard: NEGATIVE

## 2016-07-20 ENCOUNTER — Ambulatory Visit (INDEPENDENT_AMBULATORY_CARE_PROVIDER_SITE_OTHER): Payer: Commercial Managed Care - HMO | Admitting: Family Medicine

## 2016-07-20 ENCOUNTER — Other Ambulatory Visit: Payer: Self-pay | Admitting: Family Medicine

## 2016-07-20 ENCOUNTER — Encounter: Payer: Self-pay | Admitting: Family Medicine

## 2016-07-20 VITALS — BP 167/72 | HR 68 | Temp 98.2°F | Ht 66.0 in | Wt 190.6 lb

## 2016-07-20 DIAGNOSIS — Z23 Encounter for immunization: Secondary | ICD-10-CM | POA: Diagnosis not present

## 2016-07-20 DIAGNOSIS — Z5181 Encounter for therapeutic drug level monitoring: Secondary | ICD-10-CM

## 2016-07-20 DIAGNOSIS — I10 Essential (primary) hypertension: Secondary | ICD-10-CM | POA: Diagnosis not present

## 2016-07-20 DIAGNOSIS — Z1231 Encounter for screening mammogram for malignant neoplasm of breast: Secondary | ICD-10-CM

## 2016-07-20 MED ORDER — AMLODIPINE BESYLATE 5 MG PO TABS: 5.0000 mg | ORAL_TABLET | Freq: Every day | ORAL | 3 refills | Status: DC

## 2016-07-20 NOTE — Progress Notes (Signed)
Pre visit review using our clinic review tool, if applicable. No additional management support is needed unless otherwise documented below in the visit note. 

## 2016-07-20 NOTE — Patient Instructions (Addendum)
We are going to add 5mg  of amlodipine to your current lisinopril/ hctz.  Continue to take 2 of the lisinopril pills I will also set you up for an echocardiogram to check your heart muscle size and function  Please keep me posted regarding your blood pressure I'll be in touch with your labs results asap.

## 2016-07-20 NOTE — Progress Notes (Signed)
[facility] at [facility] [address] [place], [place] [address] [phone]  Date:  07/20/2016   Name:  Jeanette Mckee   DOB:  03-03-1951   MRN:  [id number]  PCP:  Abbe Amsterdam, MD    Chief Complaint: Blood Pressure Check (Pt here for blood pressure check. Pt has bp log showing that bp is running high. Tolerating Lisinopril-HCTZ well just not helping with lowering bp. )   History of Present Illness:  Jeanette Mckee is a 65 y.o. very pleasant female patient who presents with the following:  I saw this pt in June for a CPE- HPI as follows:  Here today to establish care and to have a CPE.  Her MIL who was also my patient recently passed away She had been getting her care at [facility] recently but she was personal friends with her PCP and would prefer to see me instead  S/p total hyst, 1996. Done for fibroids, no GYN cancers. She stopped seeing OBG.  She stopped her hormone medications about 2 years ago. She went though some hot flashes - now resolved- but now has pain with intercourse.   She did try topical estrogens in the past and did not have as much success for her as she did with HRT  She is married, has 2 children ages 36 and 13, she has 8 grandchildren and 2 great grands and 1 on the way.  She does not check her BP at home. The last few times her BP has been checked it has been running high. She has been on the same dose of BP for 15 years or so.  She quit smoking 14 years ago.   She does exercise- mostly walking  We increased her BP medication at that time.  Lisinopril 20/hctz 12.5- had her increase to 1.5 tablets daily and then 2 pills daily.  She has been checking her BP regularly and notes that she will get readings that are still too high- 160- 180/90-100.   She is otherwise feeling fine and has no symptoms  She has been on BP meds for "years and years," but has not had any difficulty controlling it until the last year  or so She has never had a stress test or any particular cardiac testing beyond EKG  She is using vaginal estrogen maybe 3x a week with very good results. This has really helped with vaginal dryness and discomfort with intercourse.    BP Readings from Last 3 Encounters:  04/26/16 (!) 160/90  04/19/14 136/80  12/01/13 130/84   No CP,unusual HA or SOB She has been very busy, but does not feel that she is especially stressed  Wt Readings from Last 3 Encounters:  07/20/16 190 lb 9.6 oz (86.5 kg)  04/26/16 184 lb (83.5 kg)  04/14/14 199 lb 1.3 oz (90.3 kg)     Patient Active Problem List   Diagnosis Date Noted  . Cold sore 04/19/2014  . 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 Other and unspecified hyperlipidemia 02/11/2013  . Preventative health care 02/11/2013  . HTN (hypertension) 02/04/2012  . Abnormal liver enzymes 02/04/2012  . Chronic back pain 02/04/2012    Past Medical History:  Diagnosis Date  . History of chicken pox    childhood  . Hypertension   . Measles as a child  . Other and unspecified hyperlipidemia 02/11/2013  . Preventative health care 02/11/2013    Past Surgical History:  Procedure Laterality  Date  . ABDOMINAL HYSTERECTOMY  12/1994   and RSO & LS    Social History  Substance Use Topics  . Smoking status: Former Smoker    Years: 20.00    Quit date: 11/13/2001  . Smokeless tobacco: Never Used     Comment: quit in 2003 1 ppd for 20 years  . Alcohol use Yes    Family History  Problem Relation Age of Onset  . Alcohol abuse Father   . Heart disease Father   . Stroke Father   . Hypertension Father   . Diabetes Father     type 2  . Cancer Father     prostate  . Breast cancer Neg Hx   . Colon cancer Neg Hx   . Heart disease Mother   . Hypertension Mother   . Arthritis Mother   . Heart attack Mother [id number]  . Crohn's disease Mother   . Cancer Maternal Grandmother     bone- nonsmoker  . Heart disease Son     MI s/p 3 stents, January 2015     No Known Allergies  Medication list has been reviewed and updated.  Current Outpatient Prescriptions on File Prior to Visit  Medication Sig Dispense Refill  . b complex vitamins tablet Take 1 tablet by mouth daily.    . diclofenac (CATAFLAM) 50 MG tablet Take 1 tablet (50 mg total) by mouth 2 (two) times daily as needed. (Patient taking differently: Take 50 mg by mouth daily. ) 180 tablet 1  . estradiol (ESTRACE VAGINAL) 0.1 MG/GM vaginal cream Use 2-4 grams vaginally for 2 weeks, then taper dose to 1gm 1-3x a week as needed 42.5 g 12  . lisinopril-hydrochlorothiazide (PRINZIDE,ZESTORETIC) 20-12.5 MG tablet Take 2 tablets by mouth daily. 180 tablet 3  . Multiple Vitamin (MULTIVITAMIN) tablet Take 1 tablet by mouth daily.    . Omega-3 Fatty Acids (FISH OIL) 1000 MG CAPS Take 1 capsule by mouth daily.     No current facility-administered medications on file prior to visit.     Review of Systems:  As per HPI- otherwise negative.   Physical Examination: There were no vitals filed for this visit. Vitals:   07/20/16 1554  BP: (!) 167/72  Pulse: 68  Temp: 98.2 F (36.8 C)   Body mass index is 30.76 kg/m. Ideal Body Weight: Weight in (lb) to have BMI = 25: 154.6  GEN: WDWN, NAD, Non-toxic, A & O x 3, overweight, looks well HEENT: Atraumatic, Normocephalic. Neck supple. No masses, No LAD. Ears and Nose: No external deformity. CV: RRR, No M/G/R. No JVD. No thrill. No extra heart sounds. PULM: CTA B, no wheezes, crackles, rhonchi. No retractions. No resp. distress. No accessory muscle use. ABD: S, NT, ND EXTR: No c/c/e NEURO Normal gait.  PSYCH: Normally interactive. Conversant. Not depressed or anxious appearing.  Calm demeanor.   EKG: SR with possible anterior fasicular block.  No EKG for comparison on chart  Assessment and Plan: Essential hypertension - Plan: EKG 12-Lead, ECHOCARDIOGRAM COMPLETE, amLODipine (NORVASC) 5 MG tablet, Basic metabolic panel  Accelerated  hypertension  Medication monitoring encounter  Encounter for immunization - Plan: Flu vaccine HIGH DOSE PF  Here today to discuss her BP We increased her medication dose in June but her BP is still too high. Will add norvasc for her, 5mg  Labs pending Will send her for an echo to rule out LVH She has an appt later on this month and we will recheck then   We  are going to add 5mg  of amlodipine to your current lisinopril/ hctz.  Continue to take 2 of the lisinopril pills I will also set you up for an echocardiogram to check your heart muscle size and function  Please keep me posted regarding your blood pressure I'll be in touch with your labs results asap.     Signed Abbe Amsterdam, MD

## 2016-07-21 LAB — BASIC METABOLIC PANEL
BUN: 23 mg/dL (ref 6–23)
CO2: 29 mEq/L (ref 19–32)
Calcium: 9 mg/dL (ref 8.4–10.5)
Chloride: 98 mEq/L (ref 96–112)
Creatinine, Ser: 0.63 mg/dL (ref 0.40–1.20)
GFR: 100.21 mL/min (ref >60.00)
Glucose, Bld: 94 mg/dL (ref 70–99)
Potassium: 4 mEq/L (ref 3.5–5.1)
Sodium: 133 mEq/L — ABNORMAL LOW (ref 135–145)

## 2016-07-24 ENCOUNTER — Ambulatory Visit (HOSPITAL_BASED_OUTPATIENT_CLINIC_OR_DEPARTMENT_OTHER)
Admission: RE | Admit: 2016-07-24 | Discharge: 2016-07-24 | Disposition: A | Discharge status: Home / Self Care | Payer: Commercial Managed Care - HMO | Source: Ambulatory Visit | Attending: Family Medicine | Admitting: Family Medicine

## 2016-07-24 DIAGNOSIS — Z1231 Encounter for screening mammogram for malignant neoplasm of breast: Secondary | ICD-10-CM | POA: Diagnosis not present

## 2016-08-09 ENCOUNTER — Ambulatory Visit (INDEPENDENT_AMBULATORY_CARE_PROVIDER_SITE_OTHER): Payer: Commercial Managed Care - HMO | Admitting: Family Medicine

## 2016-08-09 ENCOUNTER — Ambulatory Visit (HOSPITAL_BASED_OUTPATIENT_CLINIC_OR_DEPARTMENT_OTHER)
Admission: RE | Admit: 2016-08-09 | Discharge: 2016-08-09 | Disposition: A | Discharge status: Home / Self Care | Payer: Commercial Managed Care - HMO | Source: Ambulatory Visit | Attending: Family Medicine | Admitting: Family Medicine

## 2016-08-09 ENCOUNTER — Encounter: Payer: Self-pay | Admitting: Family Medicine

## 2016-08-09 VITALS — BP 147/83 | HR 61 | Temp 98.5°F | Ht 66.0 in | Wt 190.0 lb

## 2016-08-09 DIAGNOSIS — E871 Hypo-osmolality and hyponatremia: Secondary | ICD-10-CM | POA: Diagnosis not present

## 2016-08-09 DIAGNOSIS — E785 Hyperlipidemia, unspecified: Secondary | ICD-10-CM | POA: Diagnosis not present

## 2016-08-09 DIAGNOSIS — I119 Hypertensive heart disease without heart failure: Secondary | ICD-10-CM | POA: Diagnosis not present

## 2016-08-09 DIAGNOSIS — I10 Essential (primary) hypertension: Secondary | ICD-10-CM

## 2016-08-09 LAB — BASIC METABOLIC PANEL
BUN: 16 mg/dL (ref 6–23)
CO2: 30 mEq/L (ref 19–32)
Calcium: 9.1 mg/dL (ref 8.4–10.5)
Chloride: 100 mEq/L (ref 96–112)
Creatinine, Ser: 0.63 mg/dL (ref 0.40–1.20)
GFR: 100.19 mL/min (ref >60.00)
Glucose, Bld: 92 mg/dL (ref 70–99)
Potassium: 4.3 mEq/L (ref 3.5–5.1)
Sodium: 136 mEq/L (ref 135–145)

## 2016-08-09 LAB — ECHOCARDIOGRAM COMPLETE
Height: 66 in
Weight: 3040 oz

## 2016-08-09 NOTE — Progress Notes (Signed)
[facility] [address] [place], [place] [address] [phone] [phone]  Date:  08/09/2016   Name:  Jeanette PoleBarbara A Mckee   DOB:  1950/11/14   MRN:  [id number]  PCP:  Abbe AmsterdamOPLAND,Annielee Jemmott, MD    Chief Complaint: Follow-up (Pt here for bp check follow up. )   History of Present Illness:  Jeanette Mckee is a 65 y.o. very pleasant female patient who presents with the following:  Here today to follow-up on HTN.  We added amlodipine to her regimen in September and ordered an echo-   Yesterday her BP was 156/90; this is the best reading she has gotten at home so far. Her echo is now approved but not yet scheduled. Recent  mammo looked good.  No chest pain  BP Readings from Last 3 Encounters:  08/09/16 (!) 147/83  07/20/16 (!) 167/72  04/26/16 (!) 160/90   Her sodium was slightly low at last visit.   Will recheck today She did do a recentcologuard through Dr. Kenna GilbertMann's office but has not yet heard the results.   Patient Active Problem List   Diagnosis Date Noted  . Cold sore 04/19/2014  . 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 Other and unspecified hyperlipidemia 02/11/2013  . Preventative health care 02/11/2013  . HTN (hypertension) 02/04/2012  . Abnormal liver enzymes 02/04/2012  . Chronic back pain 02/04/2012    Past Medical History:  Diagnosis Date  . History of chicken pox    childhood  . Hypertension   . Measles as a child  . Other and unspecified hyperlipidemia 02/11/2013  . Preventative health care 02/11/2013    Past Surgical History:  Procedure Laterality Date  . ABDOMINAL HYSTERECTOMY  12/1994   and RSO & LS    Social History  Substance Use Topics  . Smoking status: Former Smoker    Years: 20.00    Quit date: 11/13/2001  . Smokeless tobacco: Never Used     Comment: quit in 2003 1 ppd for 20 years  . Alcohol use Yes    Family History  Problem Relation Age of Onset  . Alcohol abuse Father   . Heart  disease Father   . Stroke Father   . Hypertension Father   . Diabetes Father     type 2  . Cancer Father     prostate  . Breast cancer Neg Hx   . Colon cancer Neg Hx   . Heart disease Mother   . Hypertension Mother   . Arthritis Mother   . Heart attack Mother [id number]  . Crohn's disease Mother   . Cancer Maternal Grandmother     bone- nonsmoker  . Heart disease Son     MI s/p 3 stents, January 2015    No Known Allergies  Medication list has been reviewed and updated.  Current Outpatient Prescriptions on File Prior to Visit  Medication Sig Dispense Refill  . amLODipine (NORVASC) 5 MG tablet Take 1 tablet (5 mg total) by mouth daily. 90 tablet 3  . b complex vitamins tablet Take 1 tablet by mouth daily.    . diclofenac (CATAFLAM) 50 MG tablet Take 1 tablet (50 mg total) by mouth 2 (two) times daily as needed. (Patient taking differently: Take 50 mg by mouth daily. ) 180 tablet 1  . estradiol (ESTRACE VAGINAL) 0.1 MG/GM vaginal cream Use 2-4 grams vaginally for 2 weeks, then taper dose to 1gm 1-3x a week as needed  42.5 g 12  . lisinopril-hydrochlorothiazide (PRINZIDE,ZESTORETIC) 20-12.5 MG tablet Take 2 tablets by mouth daily. 180 tablet 3  . Multiple Vitamin (MULTIVITAMIN) tablet Take 1 tablet by mouth daily.    . Omega-3 Fatty Acids (FISH OIL) 1000 MG CAPS Take 1 capsule by mouth daily.     No current facility-administered medications on file prior to visit.     Review of Systems:  As per HPI- otherwise negative. No fever, chils, nausea, vomiting or rash  Physical Examination: Vitals:   08/09/16 0850  BP: (!) 147/83  Pulse: 61  Temp: 98.5 F (36.9 C)   Vitals:   08/09/16 0850  Weight: 190 lb (86.2 kg)  Height: 5\' 6"  (1.676 m)   Body mass index is 30.67 kg/m. Ideal Body Weight: Weight in (lb) to have BMI = 25: 154.6  GEN: WDWN, NAD, Non-toxic, A & O x 3, overweight, looks well HEENT: Atraumatic, Normocephalic. Neck supple. No masses, No LAD. Ears and Nose: No  external deformity. CV: RRR, No M/G/R. No JVD. No thrill. No extra heart sounds. PULM: CTA B, no wheezes, crackles, rhonchi. No retractions. No resp. distress. No accessory muscle use. EXTR: No c/c/e NEURO Normal gait.  PSYCH: Normally interactive. Conversant. Not depressed or anxious appearing.  Calm demeanor.    Assessment and Plan: Essential hypertension  Hyponatremia - Plan: Basic metabolic panel  Here today to recheck on hypertension.  She is improved with adding 5mg  of norvasc although still not a goal.  No SE noted Will have her increase to 10 mg by taking 2 of the 5mg  she has on hand Follow-up on mild hyponatremia today  Signed Abbe Amsterdam, MD

## 2016-08-09 NOTE — Progress Notes (Signed)
Pre visit review using our clinic review tool, if applicable. No additional management support is needed unless otherwise documented below in the visit note. 

## 2016-08-09 NOTE — Progress Notes (Signed)
  Echocardiogram 2D Echocardiogram has been performed.  Arvil ChacoFoster, Junaid Wurzer 08/09/2016, 12:40 PM

## 2016-08-09 NOTE — Patient Instructions (Signed)
Please increase your norvasc to 10 mg a day, and send me a message in 1-2 weeks with some blood pressure readings.  If this level of medication controls your BP but does not give you any side effects we will continue it!  I will ask our referrals person to look into your echo cardiogram Please plan to see me in about 3 months for a recheck- I'll look at your sodium level today

## 2017-02-26 ENCOUNTER — Other Ambulatory Visit: Payer: Self-pay | Admitting: Family Medicine

## 2017-02-26 DIAGNOSIS — I10 Essential (primary) hypertension: Secondary | ICD-10-CM

## 2017-03-12 ENCOUNTER — Ambulatory Visit (INDEPENDENT_AMBULATORY_CARE_PROVIDER_SITE_OTHER): Payer: Medicare HMO | Admitting: Medical

## 2017-03-12 ENCOUNTER — Encounter: Payer: Self-pay | Admitting: Medical

## 2017-03-12 VITALS — BP 158/88 | HR 69 | Temp 98.3°F | Resp 16 | Ht 66.0 in | Wt 189.6 lb

## 2017-03-12 DIAGNOSIS — J40 Bronchitis, not specified as acute or chronic: Secondary | ICD-10-CM | POA: Diagnosis not present

## 2017-03-12 DIAGNOSIS — H6121 Impacted cerumen, right ear: Secondary | ICD-10-CM | POA: Diagnosis not present

## 2017-03-12 DIAGNOSIS — R0981 Nasal congestion: Secondary | ICD-10-CM | POA: Diagnosis not present

## 2017-03-12 DIAGNOSIS — I10 Essential (primary) hypertension: Secondary | ICD-10-CM | POA: Diagnosis not present

## 2017-03-12 MED ORDER — BENZONATATE 100 MG PO CAPS: 100.0000 mg | ORAL_CAPSULE | Freq: Three times a day (TID) | ORAL | 0 refills | Status: DC | PRN

## 2017-03-12 MED ORDER — DOXYCYCLINE HYCLATE 100 MG PO TABS: 100.0000 mg | ORAL_TABLET | Freq: Two times a day (BID) | ORAL | 0 refills | Status: DC

## 2017-03-12 MED ORDER — AZELASTINE HCL 0.1 % NA SOLN: 2.0000 | Freq: Two times a day (BID) | NASAL | 2 refills | Status: DC

## 2017-03-12 NOTE — Progress Notes (Signed)
Pre visit review using our clinic review tool, if applicable. No additional management support is needed unless otherwise documented below in the visit note. 

## 2017-03-12 NOTE — Patient Instructions (Addendum)
For nasal congestion will rx astelin spray. Continue nasocort.  For cough rx benzonatate.  For early bronchitis. Rx doxycycline.  Follow up 5-7 days or as needed  For wax obstruction rt side recommend debrox over the counter. We can irrigate you ears on Thursday or Friday.(schedule nurse visit)  Please make sure taking bp meds regularly and not skip any doses. Check bp daily and confirm bp controlled.

## 2017-03-12 NOTE — Progress Notes (Signed)
Subjective:    Patient ID: Jeanette Mckee, female    DOB: 1951-04-28, 66 y.o.   MRN: [id number]  HPI   Pt in for evaluation.   Pt states she got sick 3 weeks ago. She states nasal congestion and runny nose for about 2 weeks(also expose to some vaping from her so). Then got better briefly but then past 4-5 to congested again. Pt not sure is she has allergies.   Pt denies severe allergies this time of year.  Pt will be traveling to [place]   When cough it is occasionally productive.  Pt forgot to take her blood pressure yesterday. 2 cups of coffee today. She did check her bp this morning. No cardiac or neurologic signs or symptoms.     Review of Systems  Constitutional: Negative for chills, fatigue and fever.  HENT: Positive for congestion, postnasal drip, sinus pressure and sneezing. Negative for ear pain, rhinorrhea and sinus pain.   Eyes: Positive for itching.  Respiratory: Positive for cough. Negative for chest tightness, shortness of breath and wheezing.   Cardiovascular: Negative for chest pain and palpitations.  Gastrointestinal: Negative for abdominal pain.  Musculoskeletal: Negative for back pain.  Neurological: Negative for dizziness, speech difficulty, weakness, numbness and headaches.  Hematological: Negative for adenopathy. Does not bruise/bleed easily.  Psychiatric/Behavioral: Negative for behavioral problems and confusion.    Past Medical History:  Diagnosis Date  . History of chicken pox    childhood  . Hypertension   . Measles as a child  . Other and unspecified hyperlipidemia 02/11/2013  . Preventative health care 02/11/2013     Social History   Social History  . Marital status: Married    Spouse name: N/A  . Number of children: N/A  . Years of education: N/A   Occupational History  . Not on file.   Social History Main Topics  . Smoking status: Former Smoker    Years: 20.00    Quit date: 11/13/2001  . Smokeless tobacco: Never Used     Comment:  quit in 2003 1 ppd for 20 years  . Alcohol use Yes  . Drug use: No  . Sexual activity: Yes     Comment: lives with husband, retired from church, [place]. eats a heart healthy diet, minimal meats.exercises 6 days a week   Other Topics Concern  . Not on file   Social History Narrative  . No narrative on file    Past Surgical History:  Procedure Laterality Date  . ABDOMINAL HYSTERECTOMY  12/1994   and RSO & LS    Family History  Problem Relation Age of Onset  . Alcohol abuse Father   . Heart disease Father   . Stroke Father   . Hypertension Father   . Diabetes Father     type 2  . Cancer Father     prostate  . Breast cancer Neg Hx   . Colon cancer Neg Hx   . Heart disease Mother   . Hypertension Mother   . Arthritis Mother   . Heart attack Mother 40  . Crohn's disease Mother   . Cancer Maternal Grandmother     bone- nonsmoker  . Heart disease Son     MI s/p 3 stents, January 2015    No Known Allergies  Current Outpatient Prescriptions on File Prior to Visit  Medication Sig Dispense Refill  . amLODipine (NORVASC) 5 MG tablet Take 1 tablet (5 mg total) by mouth daily. 90 tablet 3  .  b complex vitamins tablet Take 1 tablet by mouth daily.    . diclofenac (CATAFLAM) 50 MG tablet Take 1 tablet (50 mg total) by mouth 2 (two) times daily as needed. (Patient taking differently: Take 50 mg by mouth daily. ) 180 tablet 1  . estradiol (ESTRACE VAGINAL) 0.1 MG/GM vaginal cream Use 2-4 grams vaginally for 2 weeks, then taper dose to 1gm 1-3x a week as needed 42.5 g 12  . lisinopril-hydrochlorothiazide (PRINZIDE,ZESTORETIC) 20-12.5 MG tablet Take 2 tablets by mouth daily. 180 tablet 0  . Multiple Vitamin (MULTIVITAMIN) tablet Take 1 tablet by mouth daily.    . Omega-3 Fatty Acids (FISH OIL) 1000 MG CAPS Take 1 capsule by mouth daily.     No current facility-administered medications on file prior to visit.     BP (!) 188/81 (BP Location: Left Arm, Patient Position: Sitting, Cuff  Size: Normal)   Pulse 69   Temp 98.3 F (36.8 C) (Oral)   Resp 16   Ht  (1.676 m)   Wt 189 lb 9.6 oz (86 kg)   SpO2 94%   BMI 30.60 kg/m       Objective:   Physical Exam  General  Mental Status - Alert. General Appearance - Well groomed. Not in acute distress.  Skin Rashes- No Rashes.  HEENT Head- Normal. Ear Auditory Canal - Left- Normal. Right - Normal.Tympanic Membrane- Left- Normal. Right- Normal. Eye Sclera/Conjunctiva- Left- Normal. Right- Normal. Nose & Sinuses Nasal Mucosa- Left-  Boggy and Congested. Right-  Boggy and  Congested.Bilateral maxillary and frontal sinus pressure. Mouth & Throat Lips: Upper Lip- Normal: no dryness, cracking, pallor, cyanosis, or vesicular eruption. Lower Lip-Normal: no dryness, cracking, pallor, cyanosis or vesicular eruption. Buccal Mucosa- Bilateral- No Aphthous ulcers. Oropharynx- No Discharge or Erythema. Tonsils: Characteristics- Bilateral- No Erythema or Congestion. Size/Enlargement- Bilateral- No enlargement. Discharge- bilateral-None.  Neck Neck- Supple. No Masses.   Chest and Lung Exam Auscultation: Breath Sounds:-Clear even and unlabored.  Cardiovascular Auscultation:Rythm- Regular, rate and rhythm. Murmurs & Other Heart Sounds:Ausculatation of the heart reveal- No Murmurs.  Lymphatic Head & Neck General Head & Neck Lymphatics: Bilateral: Description- No Localized lymphadenopathy.       Assessment & Plan:  For nasal congestion will rx astelin spray. Continue nasocort.  For cough rx benzonatate.  For early bronchitis. Rx doxycycline.  Follow up 5-7 days or as needed  For wax obstruction rt side recommend debrox over the counter. We can irrigate you ears on Thursday or Friday.(schedule nurse visit)  For wax obstruction rt side recommend debrox over the counter. We can irrigate you ears on Thursday or Friday.(schedule nurse visit)  Please make sure taking bp meds regularly and not skip any doses.  Check bp daily and confirm bp controlled.   Moksh Loomer, Ramon Dredge, PA-C

## 2017-03-15 ENCOUNTER — Ambulatory Visit (INDEPENDENT_AMBULATORY_CARE_PROVIDER_SITE_OTHER): Payer: Medicare HMO

## 2017-03-15 DIAGNOSIS — H6121 Impacted cerumen, right ear: Secondary | ICD-10-CM | POA: Diagnosis not present

## 2017-03-15 NOTE — Progress Notes (Signed)
Pre visit review using our clinic tool,if applicable. No additional management support is needed unless otherwise documented below in the visit note.   Patient in for Ear Lavagel per order from E.Saguier, PA-C dated 03/12/17.. Patient used Debrox ear gtts  as ordered for 3 days. Successful ear lavage today. Patient states she can feel improved hearing.  Per E.Saguier ear gtts can be stopped continue ABT until completed. Patient agreed.  Checked ear post lavage and wax cleared. Faint minimal central redness. I thought this was likely related to procedure/wax coming off the membrane. Continue antibiotic.If any ear pain advised notify us.  Saguier, Ramon DredgeEdward, PA-C

## 2017-07-13 ENCOUNTER — Other Ambulatory Visit: Payer: Self-pay | Admitting: Family Medicine

## 2017-07-13 DIAGNOSIS — I10 Essential (primary) hypertension: Secondary | ICD-10-CM

## 2017-07-13 NOTE — Telephone Encounter (Signed)
Was advised in Sept to F/U in 3 months/denied/needs appt/thx dmf

## 2017-07-18 ENCOUNTER — Other Ambulatory Visit: Payer: Self-pay

## 2017-07-18 DIAGNOSIS — I10 Essential (primary) hypertension: Secondary | ICD-10-CM

## 2017-07-18 MED ORDER — LISINOPRIL-HYDROCHLOROTHIAZIDE 20-12.5 MG PO TABS: 2.0000 | ORAL_TABLET | Freq: Every day | ORAL | 0 refills | Status: DC

## 2017-07-18 MED ORDER — AMLODIPINE BESYLATE 5 MG PO TABS: 5.0000 mg | ORAL_TABLET | Freq: Every day | ORAL | 0 refills | Status: DC

## 2017-07-18 NOTE — Telephone Encounter (Signed)
Patient is scheduled for CPE for late October/faxed 30d of Amlodipine & Lisinopril HCT to [facility] and 90d to [facility] to last until seen/thx dmf

## 2017-08-27 ENCOUNTER — Other Ambulatory Visit: Payer: Self-pay | Admitting: Family Medicine

## 2017-08-27 DIAGNOSIS — N952 Postmenopausal atrophic vaginitis: Secondary | ICD-10-CM

## 2017-09-02 NOTE — Progress Notes (Signed)
Error

## 2017-09-04 ENCOUNTER — Other Ambulatory Visit: Payer: Self-pay | Admitting: Family Medicine

## 2017-09-04 DIAGNOSIS — N952 Postmenopausal atrophic vaginitis: Secondary | ICD-10-CM

## 2017-09-04 MED ORDER — ESTRADIOL 0.1 MG/GM VA CREA: TOPICAL_CREAM | VAGINAL | 12 refills | Status: DC

## 2017-09-05 ENCOUNTER — Encounter: Payer: Self-pay | Admitting: Family Medicine

## 2017-09-05 ENCOUNTER — Ambulatory Visit (INDEPENDENT_AMBULATORY_CARE_PROVIDER_SITE_OTHER): Payer: Medicare HMO | Admitting: Family Medicine

## 2017-09-05 VITALS — BP 140/82 | HR 69 | Temp 98.3°F | Ht 65.75 in | Wt 199.6 lb

## 2017-09-05 DIAGNOSIS — Z0000 Encounter for general adult medical examination without abnormal findings: Secondary | ICD-10-CM | POA: Diagnosis not present

## 2017-09-05 DIAGNOSIS — Z1231 Encounter for screening mammogram for malignant neoplasm of breast: Secondary | ICD-10-CM | POA: Diagnosis not present

## 2017-09-05 DIAGNOSIS — I10 Essential (primary) hypertension: Secondary | ICD-10-CM

## 2017-09-05 DIAGNOSIS — Z130 Encounter for screening for diseases of the blood and blood-forming organs and certain disorders involving the immune mechanism: Secondary | ICD-10-CM

## 2017-09-05 DIAGNOSIS — Z23 Encounter for immunization: Secondary | ICD-10-CM | POA: Diagnosis not present

## 2017-09-05 DIAGNOSIS — Z131 Encounter for screening for diabetes mellitus: Secondary | ICD-10-CM | POA: Diagnosis not present

## 2017-09-05 DIAGNOSIS — E2839 Other primary ovarian failure: Secondary | ICD-10-CM | POA: Diagnosis not present

## 2017-09-05 DIAGNOSIS — R0602 Shortness of breath: Secondary | ICD-10-CM | POA: Diagnosis not present

## 2017-09-05 DIAGNOSIS — Z1239 Encounter for other screening for malignant neoplasm of breast: Secondary | ICD-10-CM

## 2017-09-05 DIAGNOSIS — E782 Mixed hyperlipidemia: Secondary | ICD-10-CM

## 2017-09-05 LAB — COMPREHENSIVE METABOLIC PANEL
ALT: 32 U/L (ref 0–35)
AST: 24 U/L (ref 0–37)
Albumin: 4.3 g/dL (ref 3.5–5.2)
Alkaline Phosphatase: 101 U/L (ref 39–117)
BUN: 12 mg/dL (ref 6–23)
CO2: 30 mEq/L (ref 19–32)
Calcium: 9.7 mg/dL (ref 8.4–10.5)
Chloride: 99 mEq/L (ref 96–112)
Creatinine, Ser: 0.61 mg/dL (ref 0.40–1.20)
GFR: 103.66 mL/min (ref >60.00)
Glucose, Bld: 101 mg/dL — ABNORMAL HIGH (ref 70–99)
Potassium: 4.3 mEq/L (ref 3.5–5.1)
Sodium: 136 mEq/L (ref 135–145)
Total Bilirubin: 0.6 mg/dL (ref 0.2–1.2)
Total Protein: 7.9 g/dL (ref 6.0–8.3)

## 2017-09-05 LAB — CBC
HCT: 44.1 % (ref 36.0–46.0)
Hemoglobin: 14.8 g/dL (ref 12.0–15.0)
MCHC: 33.6 g/dL (ref 30.0–36.0)
MCV: 91.7 fl (ref 78.0–100.0)
Platelets: 281 10*3/uL (ref 150.0–400.0)
RBC: 4.81 Mil/uL (ref 3.87–5.11)
RDW: 13.2 % (ref 11.5–15.5)
WBC: 6.4 10*3/uL (ref 4.0–10.5)

## 2017-09-05 LAB — LIPID PANEL
Cholesterol: 216 mg/dL — ABNORMAL HIGH (ref 0–200)
HDL: 82.2 mg/dL (ref >39.00)
LDL Cholesterol: 121 mg/dL — ABNORMAL HIGH (ref 0–99)
NonHDL: 134.13
Total CHOL/HDL Ratio: 3
Triglycerides: 64 mg/dL (ref 0.0–149.0)
VLDL: 12.8 mg/dL (ref 0.0–40.0)

## 2017-09-05 LAB — HEMOGLOBIN A1C: Hgb A1c MFr Bld: 5.6 % (ref 4.6–6.5)

## 2017-09-05 NOTE — Progress Notes (Addendum)
[facility] [address], [address] [place], [place] [address] [phone] [phone]  Date:  09/05/2017   Name:  Jeanette Mckee Belgard   DOB:  February 15, 1951   MRN:  [id number]  PCP:  Pearline Cablesopland, Shaina Gullatt C, MD    Chief Complaint: Annual Exam (Pt here for CPE and fasting labs. Flu vaccine today. )   History of Present Illness:  Jeanette Mckee Swetz is Mckee 66 y.o. very pleasant female patient who presents with the following:  Here today for Mckee CPE History of HTN, hyperlipidemia I last saw her in September of last year, last physical in June:  Here today to establish care and to have Mckee CPE.  Her MIL who was also my patient recently passed away She [facility] but she was personal friends with her PCP and would prefer to see me instead  S/p total hyst, 1996. Done for fibroids, no GYN cancers. She stopped seeing OBG.  She stopped her hormone medications about 2 years ago. She went though some hot flashes - now resolved- but now has pain with intercourse.   She did try topical estrogens in the past and did not have as much success for her as she did with HRT  She is married, has 2 children ages 1649 and 7045, she has 8 grandchildren and 2 great grands and 1 on the way.  She does not check her BP at home. The last few times her BP has been checked it has been running high. She has been on the same dose of BP for 15 years or so.  She quit smoking 14 years ago.   She does exercise- mostly walking  Gyn: does not see, never had an abnl pap Mammo: will arrange for her  Due for complete labs- she is fasting today for labs  Flu: today Tetanus: she estimates no more than 3 years ago Due for pneumovax- will give today Due colon cancer screening dexa scan: will arrange for her  BP Readings from Last 3 Encounters:  09/05/17 140/82  03/12/17 (!) 158/88  08/09/16 (!) 147/83   She had her 3rd great grand since our last visit- the baby is  doing well  She does admit to noting some SOB- she has noted this over the last 2-3 months. She always notes this with exercise, such as playing with her grandkids or walking the dog Her mother and father had MI, and her brother had Mckee stent Pt hs never done Mckee treadmill   She also does notice that her lower back hurts with walking- she uses diclofenac for this   She did smoke in the past but quit 15 years ago   Patient Active Problem List   Diagnosis Date Noted  . Cold sore 04/19/2014  . 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 Other and unspecified hyperlipidemia 02/11/2013  . Preventative health care 02/11/2013  . HTN (hypertension) 02/04/2012  . Abnormal liver enzymes 02/04/2012  . Chronic back pain 02/04/2012    Past Medical History:  Diagnosis Date  . History of chicken pox    childhood  . Hypertension   . Measles as Mckee child  . Other and unspecified hyperlipidemia 02/11/2013  . Preventative health care 02/11/2013    Past Surgical History:  Procedure Laterality Date  . ABDOMINAL HYSTERECTOMY  12/1994   and RSO & LS    Social History  Substance Use Topics  . Smoking status:  Former Smoker    Years: 20.00    Quit date: 11/13/2001  . Smokeless tobacco: Never Used     Comment: quit in 2003 1 ppd for 20 years  . Alcohol use Yes    Family History  Problem Relation Age of Onset  . Alcohol abuse Father   . Heart disease Father   . Stroke Father   . Hypertension Father   . Diabetes Father        type 2  . Cancer Father        prostate  . Heart disease Mother   . Hypertension Mother   . Arthritis Mother   . Heart attack Mother 83  . Crohn's disease Mother   . Cancer Maternal Grandmother        bone- nonsmoker  . Heart disease Son        MI s/p 3 stents, January 2015  . Breast cancer Neg Hx   . Colon cancer Neg Hx     No Known Allergies  Medication list has been reviewed and updated.  Current Outpatient Prescriptions on File Prior to Visit   Medication Sig Dispense Refill  . amLODipine (NORVASC) 5 MG tablet Take 1 tablet (5 mg total) by mouth daily. Plz fill this 30d Rx to last till Mail Order arrives 30 tablet 0  . azelastine (ASTELIN) 0.1 % nasal spray Place 2 sprays into both nostrils 2 (two) times daily. Use in each nostril as directed 30 mL 2  . b complex vitamins tablet Take 1 tablet by mouth daily.    . benzonatate (TESSALON) 100 MG capsule Take 1 capsule (100 mg total) by mouth 3 (three) times daily as needed for cough. 21 capsule 0  . diclofenac (CATAFLAM) 50 MG tablet Take 1 tablet (50 mg total) by mouth 2 (two) times daily as needed. (Patient taking differently: Take 50 mg by mouth daily. ) 180 tablet 1  . doxycycline (VIBRA-TABS) 100 MG tablet Take 1 tablet (100 mg total) by mouth 2 (two) times daily. Can give caps or generic 20 tablet 0  . estradiol (ESTRACE) 0.1 MG/GM vaginal cream APPLY 2 TO 4G (1/2 TO 1 APPILICATORFUL) VAGINALLY DAILY FOR 2 WEEKS THEN TAPER TO 1G (1/4 APPLICATORFUL) 1 TO 3 TIMES WEEKLY 42.5 g 12  . lisinopril-hydrochlorothiazide (PRINZIDE,ZESTORETIC) 20-12.5 MG tablet Take 2 tablets by mouth daily. 60 tablet 0  . Multiple Vitamin (MULTIVITAMIN) tablet Take 1 tablet by mouth daily.    . Omega-3 Fatty Acids (FISH OIL) 1000 MG CAPS Take 1 capsule by mouth daily.     No current facility-administered medications on file prior to visit.     Review of Systems:  As per HPI- otherwise negative.   Physical Examination: Vitals:   09/05/17 0930  BP: 140/82  Pulse: 69  Temp: 98.3 F (36.8 C)  SpO2: 98%   Vitals:   09/05/17 0930  Weight: 199 lb 9.6 oz (90.5 kg)  Height: 5' 5.75" (1.67 m)   Body mass index is 32.46 kg/m. Ideal Body Weight: Weight in (lb) to have BMI = 25: 153.4  GEN: WDWN, NAD, Non-toxic, Mckee & O x 3, obese, otherwise looks well HEENT: Atraumatic, Normocephalic. Neck supple. No masses, No LAD.  Bilateral TM wnl, oropharynx normal.  PEERL,EOMI.   Ears and Nose: No external  deformity. CV: RRR, No M/G/R. No JVD. No thrill. No extra heart sounds. PULM: CTA B, no wheezes, crackles, rhonchi. No retractions. No resp. distress. No accessory muscle use. ABD: S, NT, ND, +  BS. No rebound. No HSM. EXTR: No c/c/e NEURO Normal gait.  PSYCH: Normally interactive. Conversant. Not depressed or anxious appearing.  Calm demeanor.  Breast: normal exam, no masses/ dimpling/ discharge  EKG: compared with 9/17 SR, no change but she does have left axis.    Assessment and Plan: Physical exam  Screening for deficiency anemia - Plan: CBC  Screening for diabetes mellitus - Plan: Comprehensive metabolic panel, Hemoglobin A1c  Mixed hyperlipidemia - Plan: Lipid panel  Immunization due - Plan: Pneumococcal polysaccharide vaccine 23-valent greater than or equal to 2yo subcutaneous/IM, Flu vaccine HIGH DOSE PF (Fluzone High dose)  Shortness of breath on exertion - Plan: EKG 12-Lead, Exercise Tolerance Test  Estrogen deficiency - Plan: DG Bone Density  Screening for breast cancer - Plan: MM SCREENING BREAST TOMO BILATERAL  Essential hypertension  Here today for Mckee CPE Updated immunizations for her today Set up for dexa and mammo Referral for treadmill given SOB with exercise over the last few months and family history  Follow-up on her blood sugar and cholesterol today BP under ok control- encourage weight loss, she had put on about 10 lbs since last year   Wt Readings from Last 3 Encounters:  09/05/17 199 lb 9.6 oz (90.5 kg)  03/12/17 189 lb 9.6 oz (86 kg)  08/09/16 190 lb (86.2 kg)     Signed Abbe Amsterdam, MD  Received her labs Results for orders placed or performed in visit on 09/05/17  CBC  Result Value Ref Range   WBC 6.4 4.0 - 10.5 K/uL   RBC 4.81 3.87 - 5.11 Mil/uL   Platelets 281.0 150.0 - 400.0 K/uL   Hemoglobin 14.8 12.0 - 15.0 g/dL   HCT 16.1 09.6 - 04.5 %   MCV 91.7 78.0 - 100.0 fl   MCHC 33.6 30.0 - 36.0 g/dL   RDW 40.9 81.1 - 91.4 %   Comprehensive metabolic panel  Result Value Ref Range   Sodium 136 135 - 145 mEq/L   Potassium 4.3 3.5 - 5.1 mEq/L   Chloride 99 96 - 112 mEq/L   CO2 30 19 - 32 mEq/L   Glucose, Bld 101 (H) 70 - 99 mg/dL   BUN 12 6 - 23 mg/dL   Creatinine, Ser 7.82 0.40 - 1.20 mg/dL   Total Bilirubin 0.6 0.2 - 1.2 mg/dL   Alkaline Phosphatase 101 39 - 117 U/L   AST 24 0 - 37 U/L   ALT 32 0 - 35 U/L   Total Protein 7.9 6.0 - 8.3 g/dL   Albumin 4.3 3.5 - 5.2 g/dL   Calcium 9.7 8.4 - 95.6 mg/dL   GFR 213.08 >65.78 mL/min  Hemoglobin A1c  Result Value Ref Range   Hgb A1c MFr Bld 5.6 4.6 - 6.5 %  Lipid panel  Result Value Ref Range   Cholesterol 216 (H) 0 - 200 mg/dL   Triglycerides 46.9 0.0 - 149.0 mg/dL   HDL 62.95 >28.41 mg/dL   VLDL 32.4 0.0 - 40.1 mg/dL   LDL Cholesterol 027 (H) 0 - 99 mg/dL   Total CHOL/HDL Ratio 3    NonHDL 134.13    Message to pt

## 2017-09-05 NOTE — Patient Instructions (Addendum)
It was a pleasure to see you today! Take care and I will be in touch with your labs We will set you up for your bone density test, mammogram and treadmill stress test Please let me know if you have any changes or worsening in your exercise symptoms, and please take it a bit easy until you have your treadmill I would also recommend taking a baby aspirin (65m) until we make sure that all is well   Health Maintenance for Postmenopausal Women Menopause is a normal process in which your reproductive ability comes to an end. This process happens gradually over a span of months to years, usually between the ages of 469and 513 Menopause is complete when you have missed 12 consecutive menstrual periods. It is important to talk with your health care provider about some of the most common conditions that affect postmenopausal women, such as heart disease, cancer, and bone loss (osteoporosis). Adopting a healthy lifestyle and getting preventive care can help to promote your health and wellness. Those actions can also lower your chances of developing some of these common conditions. What should I know about menopause? During menopause, you may experience a number of symptoms, such as:  Moderate-to-severe hot flashes.  Night sweats.  Decrease in sex drive.  Mood swings.  Headaches.  Tiredness.  Irritability.  Memory problems.  Insomnia.  Choosing to treat or not to treat menopausal changes is an individual decision that you make with your health care provider. What should I know about hormone replacement therapy and supplements? Hormone therapy products are effective for treating symptoms that are associated with menopause, such as hot flashes and night sweats. Hormone replacement carries certain risks, especially as you become older. If you are thinking about using estrogen or estrogen with progestin treatments, discuss the benefits and risks with your health care provider. What should I know  about heart disease and stroke? Heart disease, heart attack, and stroke become more likely as you age. This may be due, in part, to the hormonal changes that your body experiences during menopause. These can affect how your body processes dietary fats, triglycerides, and cholesterol. Heart attack and stroke are both medical emergencies. There are many things that you can do to help prevent heart disease and stroke:  Have your blood pressure checked at least every 1-2 years. High blood pressure causes heart disease and increases the risk of stroke.  If you are [id number] old, ask your health care provider if you should take aspirin to prevent a heart attack or a stroke.  Do not use any tobacco products, including cigarettes, chewing tobacco, or electronic cigarettes. If you need help quitting, ask your health care provider.  It is important to eat a healthy diet and maintain a healthy weight. ? Be sure to include plenty of vegetables, fruits, low-fat dairy products, and lean protein. ? Avoid eating foods that are high in solid fats, added sugars, or salt (sodium).  Get regular exercise. This is one of the most important things that you can do for your health. ? Try to exercise for at least 150 minutes each week. The type of exercise that you do should increase your heart rate and make you sweat. This is known as moderate-intensity exercise. ? Try to do strengthening exercises at least twice each week. Do these in addition to the moderate-intensity exercise.  Know your numbers.Ask your health care provider to check your cholesterol and your blood glucose. Continue to have your blood tested as directed  by your health care provider.  What should I know about cancer screening? There are several types of cancer. Take the following steps to reduce your risk and to catch any cancer development as early as possible. Breast Cancer  Practice breast self-awareness. ? This means understanding how your  breasts normally appear and feel. ? It also means doing regular breast self-exams. Let your health care provider know about any changes, no matter how small.  If you are 6 or older, have a clinician do a breast exam (clinical breast exam or CBE) every year. Depending on your age, family history, and medical history, it may be recommended that you also have a yearly breast X-ray (mammogram).  If you have a family history of breast cancer, talk with your health care provider about genetic screening.  If you are at high risk for breast cancer, talk with your health care provider about having an MRI and a mammogram every year.  Breast cancer (BRCA) gene test is recommended for women who have family members with BRCA-related cancers. Results of the assessment will determine the need for genetic counseling and BRCA1 and for BRCA2 testing. BRCA-related cancers include these types: ? Breast. This occurs in males or females. ? Ovarian. ? Tubal. This may also be called fallopian tube cancer. ? Cancer of the abdominal or pelvic lining (peritoneal cancer). ? Prostate. ? Pancreatic.  Cervical, Uterine, and Ovarian Cancer Your health care provider may recommend that you be screened regularly for cancer of the pelvic organs. These include your ovaries, uterus, and vagina. This screening involves a pelvic exam, which includes checking for microscopic changes to the surface of your cervix (Pap test).  For women ages 21-65, health care providers may recommend a pelvic exam and a Pap test every three years. For women ages 32-65, they may recommend the Pap test and pelvic exam, combined with testing for human papilloma virus (HPV), every five years. Some types of HPV increase your risk of cervical cancer. Testing for HPV may also be done on women of any age who have unclear Pap test results.  Other health care providers may not recommend any screening for nonpregnant women who are considered low risk for pelvic  cancer and have no symptoms. Ask your health care provider if a screening pelvic exam is right for you.  If you have had past treatment for cervical cancer or a condition that could lead to cancer, you need Pap tests and screening for cancer for at least 20 years after your treatment. If Pap tests have been discontinued for you, your risk factors (such as having a new sexual partner) need to be reassessed to determine if you should start having screenings again. Some women have medical problems that increase the chance of getting cervical cancer. In these cases, your health care provider may recommend that you have screening and Pap tests more often.  If you have a family history of uterine cancer or ovarian cancer, talk with your health care provider about genetic screening.  If you have vaginal bleeding after reaching menopause, tell your health care provider.  There are currently no reliable tests available to screen for ovarian cancer.  Lung Cancer Lung cancer screening is recommended for adults 62-5 years old who are at high risk for lung cancer because of a history of smoking. A yearly low-dose CT scan of the lungs is recommended if you:  Currently smoke.  Have a history of at least 30 pack-years of smoking and you currently smoke  or have quit within the past 15 years. A pack-year is smoking an average of one pack of cigarettes per day for one year.  Yearly screening should:  Continue until it has been 15 years since you quit.  Stop if you develop a health problem that would prevent you from having lung cancer treatment.  Colorectal Cancer  This type of cancer can be detected and can often be prevented.  Routine colorectal cancer screening usually begins at age 73 and continues through age 41.  If you have risk factors for colon cancer, your health care provider may recommend that you be screened at an earlier age.  If you have a family history of colorectal cancer, talk with  your health care provider about genetic screening.  Your health care provider may also recommend using home test kits to check for hidden blood in your stool.  A small camera at the end of a tube can be used to examine your colon directly (sigmoidoscopy or colonoscopy). This is done to check for the earliest forms of colorectal cancer.  Direct examination of the colon should be repeated every 5-10 years until age 65. However, if early forms of precancerous polyps or small growths are found or if you have a family history or genetic risk for colorectal cancer, you may need to be screened more often.  Skin Cancer  Check your skin from head to toe regularly.  Monitor any moles. Be sure to tell your health care provider: ? About any new moles or changes in moles, especially if there is a change in a mole's shape or color. ? If you have a mole that is larger than the size of a pencil eraser.  If any of your family members has a history of skin cancer, especially at a young age, talk with your health care provider about genetic screening.  Always use sunscreen. Apply sunscreen liberally and repeatedly throughout the day.  Whenever you are outside, protect yourself by wearing long sleeves, pants, a wide-brimmed hat, and sunglasses.  What should I know about osteoporosis? Osteoporosis is a condition in which bone destruction happens more quickly than new bone creation. After menopause, you may be at an increased risk for osteoporosis. To help prevent osteoporosis or the bone fractures that can happen because of osteoporosis, the following is recommended:  If you are 41-78 years old, get at least 1,000 mg of calcium and at least 600 mg of vitamin D per day.  If you are older than age 80 but younger than age 30, get at least 1,200 mg of calcium and at least 600 mg of vitamin D per day.  If you are older than age 79, get at least 1,200 mg of calcium and at least 800 mg of vitamin D per  day.  Smoking and excessive alcohol intake increase the risk of osteoporosis. Eat foods that are rich in calcium and vitamin D, and do weight-bearing exercises several times each week as directed by your health care provider. What should I know about how menopause affects my mental health? Depression may occur at any age, but it is more common as you become older. Common symptoms of depression include:  Low or sad mood.  Changes in sleep patterns.  Changes in appetite or eating patterns.  Feeling an overall lack of motivation or enjoyment of activities that you previously enjoyed.  Frequent crying spells.  Talk with your health care provider if you think that you are experiencing depression. What should I  know about immunizations? It is important that you get and maintain your immunizations. These include:  Tetanus, diphtheria, and pertussis (Tdap) booster vaccine.  Influenza every year before the flu season begins.  Pneumonia vaccine.  Shingles vaccine.  Your health care provider may also recommend other immunizations. This information is not intended to replace advice given to you by your health care provider. Make sure you discuss any questions you have with your health care provider. Document Released: 12/22/2005 Document Revised: 05/19/2016 Document Reviewed: 08/03/2015 Elsevier Interactive Patient Education  2018 [employer].

## 2017-09-05 NOTE — Progress Notes (Addendum)
Subjective:   Jeanette Mckee is a 66 y.o. female who presents for an Initial Medicare Annual Wellness Visit.  Review of Systems    No ROS.  Medicare Wellness Visit. Additional risk factors are reflected in the social history. Cardiac Risk Factors include: advanced age (>13men, >43 women);dyslipidemia;hypertension;obesity (BMI >30kg/m2) Sleep patterns: Sleeping well and feeling rested.    Female:   Pap-   Declines.    Mammo-  ordered     Dexa scan-  ordered [facility]- last 11/13/04 normal per pt  Objective:    Today's Vitals   09/12/17 1509  BP: (!) 146/72  Pulse: 67  SpO2: 96%  Weight: 203 lb 3.2 oz (92.2 kg)   Body mass index is 33.05 kg/m.   Current Medications (verified) Outpatient Encounter Prescriptions as of 09/12/2017  Medication Sig  . amLODipine (NORVASC) 5 MG tablet Take 1 tablet (5 mg total) by mouth daily. Plz fill this 30d Rx to last till Mail Order arrives  . azelastine (ASTELIN) 0.1 % nasal spray Place 2 sprays into both nostrils 2 (two) times daily. Use in each nostril as directed  . b complex vitamins tablet Take 1 tablet by mouth daily.  . diclofenac (CATAFLAM) 50 MG tablet Take 1 tablet (50 mg total) by mouth 2 (two) times daily as needed. (Patient taking differently: Take 50 mg by mouth daily. )  . lisinopril-hydrochlorothiazide (PRINZIDE,ZESTORETIC) 20-12.5 MG tablet Take 2 tablets by mouth daily.  . Multiple Vitamin (MULTIVITAMIN) tablet Take 1 tablet by mouth daily.  . Omega-3 Fatty Acids (FISH OIL) 1000 MG CAPS Take 1 capsule by mouth daily.  Marland Kitchen estradiol (ESTRACE) 0.1 MG/GM vaginal cream APPLY 2 TO 4G (1/2 TO 1 APPILICATORFUL) VAGINALLY DAILY FOR 2 WEEKS THEN TAPER TO 1G (1/4 APPLICATORFUL) 1 TO 3 TIMES WEEKLY  . [DISCONTINUED] benzonatate (TESSALON) 100 MG capsule Take 1 capsule (100 mg total) by mouth 3 (three) times daily as needed for cough.   No facility-administered encounter medications on file as of 09/12/2017.     Allergies (verified) Patient  has no known allergies.   History: Past Medical History:  Diagnosis Date  . History of chicken pox    childhood  . Hypertension   . Measles as a child  . Other and unspecified hyperlipidemia 02/11/2013  . Preventative health care 02/11/2013   Past Surgical History:  Procedure Laterality Date  . ABDOMINAL HYSTERECTOMY  12/1994   and RSO & LS   Family History  Problem Relation Age of Onset  . Alcohol abuse Father   . Heart disease Father   . Stroke Father   . Hypertension Father   . Diabetes Father        type 2  . Cancer Father        prostate  . Heart disease Mother   . Hypertension Mother   . Arthritis Mother   . Heart attack Mother 76  . Crohn's disease Mother   . Cancer Maternal Grandmother        bone- nonsmoker  . Heart disease Son        MI s/p 3 stents, January 2015  . Breast cancer Neg Hx   . Colon cancer Neg Hx    Social History   Occupational History  . Not on file.   Social History Main Topics  . Smoking status: Former Smoker    Years: 20.00    Quit date: 11/13/2001  . Smokeless tobacco: Never Used     Comment: quit in 2003  1 ppd for 20 years  . Alcohol use Yes     Comment: WINE FRIDAY AND SATURDAY  . Drug use: No  . Sexual activity: Yes     Comment: lives with husband, retired from church, [place]. eats a heart healthy diet, minimal meats.exercises 6 days a week    Tobacco Counseling Counseling given: Not Answered   Activities of Daily Living In your present state of health, do you have any difficulty performing the following activities: 09/12/2017  Hearing? N  Vision? N  Difficulty concentrating or making decisions? N  Walking or climbing stairs? Y  Comment KNEE AND BACK PAIN  Dressing or bathing? N  Doing errands, shopping? N  Preparing Food and eating ? N  Using the Toilet? N  In the past six months, have you accidently leaked urine? N  Do you have problems with loss of bowel control? N  Managing your Medications? N  Managing your  Finances? N  Housekeeping or managing your Housekeeping? N  Some recent data might be hidden    Immunizations and Health Maintenance Immunization History  Administered Date(s) Administered  . Influenza, High Dose Seasonal PF 07/20/2016, 09/05/2017  . Pneumococcal Conjugate-13 02/03/2016  . Pneumococcal Polysaccharide-23 09/05/2017   Health Maintenance Due  Topic Date Due  . TETANUS/TDAP  08/22/1968  . DEXA SCAN  08/22/2014  . COLONOSCOPY  11/13/2014    Patient Care Team: Copland, Gwenlyn Found, MD as PCP - General (Family Medicine)  Indicate any recent Medical Services you may have received from other than [facility] providers in the past year (date may be approximate).     Assessment:   This is a routine wellness examination for [place]. Physical assessment deferred to PCP.  Hearing/Vision screen Hearing Screening Comments: Able to hear conversational tones w/o difficulty. No issues reported.   Vision Screening Comments: Wearing glasses. Eye doctor appt yearly per pt. Dr.Daughtry  Dietary issues and exercise activities discussed: Current Exercise Habits: Structured exercise class, Frequency (Times/Week): 3, Intensity: Mild Diet (meal preparation, eat out, water intake, caffeinated beverages, dairy products, fruits and vegetables): in general, a "healthy" diet     Goals    . Reduce other obligations and focus on family (pt-stated)      Depression Screen PHQ 2/9 Scores 09/12/2017 04/26/2016  PHQ - 2 Score 0 0    Fall Risk Fall Risk  09/12/2017 04/26/2016  Falls in the past year? No No    Cognitive Function: MMSE - Mini Mental State Exam 09/12/2017  Orientation to time 5  Orientation to Place 5  Registration 3  Attention/ Calculation 5  Recall 3  Language- name 2 objects 2  Language- repeat 1  Language- follow 3 step command 3  Language- read & follow direction 1  Write a sentence 1  Copy design 1  Total score 30        Screening Tests Health Maintenance    Topic Date Due  . TETANUS/TDAP  08/22/1968  . DEXA SCAN  08/22/2014  . COLONOSCOPY  11/13/2014  . MAMMOGRAM  07/24/2018  . INFLUENZA VACCINE  Completed  . Hepatitis C Screening  Addressed  . PNA vac Low Risk Adult  Completed      Plan:   Follow up with PCP as directed.  Continue to eat heart healthy diet (full of fruits, vegetables, whole grains, lean protein, water--limit salt, fat, and sugar intake) and increase physical activity as tolerated.  Continue doing brain stimulating activities (puzzles, reading, adult coloring books, staying active) to keep  memory sharp.   Bring a copy of your living will and/or healthcare power of attorney to your next office visit.   I have personally reviewed and noted the following in the patient's chart:   . Medical and social history . Use of alcohol, tobacco or illicit drugs  . Current medications and supplements . Functional ability and status . Nutritional status . Physical activity . Advanced directives . List of other physicians . Hospitalizations, surgeries, and ER visits in previous 12 months . Vitals . Screenings to include cognitive, depression, and falls . Referrals and appointments  In addition, I have reviewed and discussed with patient certain preventive protocols, quality metrics, and best practice recommendations. A written personalized care plan for preventive services as well as general preventive health recommendations were provided to patient.     Avon GullyBritt, Yliana Gravois Angel, RN   09/12/2017     I have reviewed MWE by Ms. Estefani Bateson as above and agree with her documentation Arthor CaptainJ Copland MD

## 2017-09-12 ENCOUNTER — Ambulatory Visit (INDEPENDENT_AMBULATORY_CARE_PROVIDER_SITE_OTHER): Payer: Medicare HMO | Admitting: *Deleted

## 2017-09-12 ENCOUNTER — Encounter: Payer: Self-pay | Admitting: *Deleted

## 2017-09-12 VITALS — BP 146/72 | HR 67 | Wt 203.2 lb

## 2017-09-12 DIAGNOSIS — Z0000 Encounter for general adult medical examination without abnormal findings: Secondary | ICD-10-CM

## 2017-09-12 NOTE — Patient Instructions (Signed)
Keep up the great work!!!  Continue to eat heart healthy diet (full of fruits, vegetables, whole grains, lean protein, water--limit salt, fat, and sugar intake) and increase physical activity as tolerated.  Continue doing brain stimulating activities (puzzles, reading, adult coloring books, staying active) to keep memory sharp.   Bring a copy of your living will and/or healthcare power of attorney to your next office visit.    Jeanette Mckee , Thank you for taking time to come for your Medicare Wellness Visit. I appreciate your ongoing commitment to your health goals. Please review the following plan we discussed and let me know if I can assist you in the future.   These are the goals we discussed: Goals    . Reduce other obligations and focus on family (pt-stated)       This is a list of the screening recommended for you and due dates:  Health Maintenance  Topic Date Due  . Tetanus Vaccine  08/22/1968  . DEXA scan (bone density measurement)  08/22/2014  . Colon Cancer Screening  11/13/2014  . Mammogram  07/24/2018  . Flu Shot  Completed  .  Hepatitis C: One time screening is recommended by Center for Disease Control  (CDC) for  adults born from [birth year] through 1967.   Addressed  . Pneumonia vaccines  Completed    Health Maintenance for Postmenopausal Women Menopause is a normal process in which your reproductive ability comes to an end. This process happens gradually over a span of months to years, usually between the ages of 65 and 24. Menopause is complete when you have missed 12 consecutive menstrual periods. It is important to talk with your health care provider about some of the most common conditions that affect postmenopausal women, such as heart disease, cancer, and bone loss (osteoporosis). Adopting a healthy lifestyle and getting preventive care can help to promote your health and wellness. Those actions can also lower your chances of developing some of these common  conditions. What should I know about menopause? During menopause, you may experience a number of symptoms, such as:  Moderate-to-severe hot flashes.  Night sweats.  Decrease in sex drive.  Mood swings.  Headaches.  Tiredness.  Irritability.  Memory problems.  Insomnia.  Choosing to treat or not to treat menopausal changes is an individual decision that you make with your health care provider. What should I know about hormone replacement therapy and supplements? Hormone therapy products are effective for treating symptoms that are associated with menopause, such as hot flashes and night sweats. Hormone replacement carries certain risks, especially as you become older. If you are thinking about using estrogen or estrogen with progestin treatments, discuss the benefits and risks with your health care provider. What should I know about heart disease and stroke? Heart disease, heart attack, and stroke become more likely as you age. This may be due, in part, to the hormonal changes that your body experiences during menopause. These can affect how your body processes dietary fats, triglycerides, and cholesterol. Heart attack and stroke are both medical emergencies. There are many things that you can do to help prevent heart disease and stroke:  Have your blood pressure checked at least every 1-2 years. High blood pressure causes heart disease and increases the risk of stroke.  If you are 64-77 years old, ask your health care provider if you should take aspirin to prevent a heart attack or a stroke.  Do not use any tobacco products, including cigarettes, chewing tobacco, or  electronic cigarettes. If you need help quitting, ask your health care provider.  It is important to eat a healthy diet and maintain a healthy weight. ? Be sure to include plenty of vegetables, fruits, low-fat dairy products, and lean protein. ? Avoid eating foods that are high in solid fats, added sugars, or salt  (sodium).  Get regular exercise. This is one of the most important things that you can do for your health. ? Try to exercise for at least 150 minutes each week. The type of exercise that you do should increase your heart rate and make you sweat. This is known as moderate-intensity exercise. ? Try to do strengthening exercises at least twice each week. Do these in addition to the moderate-intensity exercise.  Know your numbers.Ask your health care provider to check your cholesterol and your blood glucose. Continue to have your blood tested as directed by your health care provider.  What should I know about cancer screening? There are several types of cancer. Take the following steps to reduce your risk and to catch any cancer development as early as possible. Breast Cancer  Practice breast self-awareness. ? This means understanding how your breasts normally appear and feel. ? It also means doing regular breast self-exams. Let your health care provider know about any changes, no matter how small.  If you are 63 or older, have a clinician do a breast exam (clinical breast exam or CBE) every year. Depending on your age, family history, and medical history, it may be recommended that you also have a yearly breast X-ray (mammogram).  If you have a family history of breast cancer, talk with your health care provider about genetic screening.  If you are at high risk for breast cancer, talk with your health care provider about having an MRI and a mammogram every year.  Breast cancer (BRCA) gene test is recommended for women who have family members with BRCA-related cancers. Results of the assessment will determine the need for genetic counseling and BRCA1 and for BRCA2 testing. BRCA-related cancers include these types: ? Breast. This occurs in males or females. ? Ovarian. ? Tubal. This may also be called fallopian tube cancer. ? Cancer of the abdominal or pelvic lining (peritoneal  cancer). ? Prostate. ? Pancreatic.  Cervical, Uterine, and Ovarian Cancer Your health care provider may recommend that you be screened regularly for cancer of the pelvic organs. These include your ovaries, uterus, and vagina. This screening involves a pelvic exam, which includes checking for microscopic changes to the surface of your cervix (Pap test).  For women ages 21-65, health care providers may recommend a pelvic exam and a Pap test every three years. For women ages 67-65, they may recommend the Pap test and pelvic exam, combined with testing for human papilloma virus (HPV), every five years. Some types of HPV increase your risk of cervical cancer. Testing for HPV may also be done on women of any age who have unclear Pap test results.  Other health care providers may not recommend any screening for nonpregnant women who are considered low risk for pelvic cancer and have no symptoms. Ask your health care provider if a screening pelvic exam is right for you.  If you have had past treatment for cervical cancer or a condition that could lead to cancer, you need Pap tests and screening for cancer for at least 20 years after your treatment. If Pap tests have been discontinued for you, your risk factors (such as having a new sexual  partner) need to be reassessed to determine if you should start having screenings again. Some women have medical problems that increase the chance of getting cervical cancer. In these cases, your health care provider may recommend that you have screening and Pap tests more often.  If you have a family history of uterine cancer or ovarian cancer, talk with your health care provider about genetic screening.  If you have vaginal bleeding after reaching menopause, tell your health care provider.  There are currently no reliable tests available to screen for ovarian cancer.  Lung Cancer Lung cancer screening is recommended for adults 1-3 years old who are at high risk for  lung cancer because of a history of smoking. A yearly low-dose CT scan of the lungs is recommended if you:  Currently smoke.  Have a history of at least 30 pack-years of smoking and you currently smoke or have quit within the past 15 years. A pack-year is smoking an average of one pack of cigarettes per day for one year.  Yearly screening should:  Continue until it has been 15 years since you quit.  Stop if you develop a health problem that would prevent you from having lung cancer treatment.  Colorectal Cancer  This type of cancer can be detected and can often be prevented.  Routine colorectal cancer screening usually begins at age 30 and continues through age 62.  If you have risk factors for colon cancer, your health care provider may recommend that you be screened at an earlier age.  If you have a family history of colorectal cancer, talk with your health care provider about genetic screening.  Your health care provider may also recommend using home test kits to check for hidden blood in your stool.  A small camera at the end of a tube can be used to examine your colon directly (sigmoidoscopy or colonoscopy). This is done to check for the earliest forms of colorectal cancer.  Direct examination of the colon should be repeated every 5-10 years until age 15. However, if early forms of precancerous polyps or small growths are found or if you have a family history or genetic risk for colorectal cancer, you may need to be screened more often.  Skin Cancer  Check your skin from head to toe regularly.  Monitor any moles. Be sure to tell your health care provider: ? About any new moles or changes in moles, especially if there is a change in a mole's shape or color. ? If you have a mole that is larger than the size of a pencil eraser.  If any of your family members has a history of skin cancer, especially at a young age, talk with your health care provider about genetic  screening.  Always use sunscreen. Apply sunscreen liberally and repeatedly throughout the day.  Whenever you are outside, protect yourself by wearing long sleeves, pants, a wide-brimmed hat, and sunglasses.  What should I know about osteoporosis? Osteoporosis is a condition in which bone destruction happens more quickly than new bone creation. After menopause, you may be at an increased risk for osteoporosis. To help prevent osteoporosis or the bone fractures that can happen because of osteoporosis, the following is recommended:  If you are 67-13 years old, get at least 1,000 mg of calcium and at least 600 mg of vitamin D per day.  If you are older than age 71 but younger than age 53, get at least 1,200 mg of calcium and at least 600 mg  of vitamin D per day.  If you are older than age 46, get at least 1,200 mg of calcium and at least 800 mg of vitamin D per day.  Smoking and excessive alcohol intake increase the risk of osteoporosis. Eat foods that are rich in calcium and vitamin D, and do weight-bearing exercises several times each week as directed by your health care provider. What should I know about how menopause affects my mental health? Depression may occur at any age, but it is more common as you become older. Common symptoms of depression include:  Low or sad mood.  Changes in sleep patterns.  Changes in appetite or eating patterns.  Feeling an overall lack of motivation or enjoyment of activities that you previously enjoyed.  Frequent crying spells.  Talk with your health care provider if you think that you are experiencing depression. What should I know about immunizations? It is important that you get and maintain your immunizations. These include:  Tetanus, diphtheria, and pertussis (Tdap) booster vaccine.  Influenza every year before the flu season begins.  Pneumonia vaccine.  Shingles vaccine.  Your health care provider may also recommend other  immunizations. This information is not intended to replace advice given to you by your health care provider. Make sure you discuss any questions you have with your health care provider. Document Released: 12/22/2005 Document Revised: 05/19/2016 Document Reviewed: 08/03/2015 Elsevier Interactive Patient Education  2018 [employer].

## 2017-09-24 ENCOUNTER — Encounter: Payer: Self-pay | Admitting: Family Medicine

## 2017-09-24 ENCOUNTER — Ambulatory Visit (HOSPITAL_BASED_OUTPATIENT_CLINIC_OR_DEPARTMENT_OTHER)
Admission: RE | Admit: 2017-09-24 | Discharge: 2017-09-24 | Disposition: A | Discharge status: Home / Self Care | Payer: Medicare HMO | Source: Ambulatory Visit | Attending: Family Medicine | Admitting: Family Medicine

## 2017-09-24 DIAGNOSIS — Z1231 Encounter for screening mammogram for malignant neoplasm of breast: Secondary | ICD-10-CM | POA: Diagnosis not present

## 2017-09-24 DIAGNOSIS — Z780 Asymptomatic menopausal state: Secondary | ICD-10-CM | POA: Diagnosis not present

## 2017-09-24 DIAGNOSIS — Z13820 Encounter for screening for osteoporosis: Secondary | ICD-10-CM | POA: Diagnosis not present

## 2017-09-24 DIAGNOSIS — Z1239 Encounter for other screening for malignant neoplasm of breast: Secondary | ICD-10-CM

## 2017-09-24 DIAGNOSIS — E2839 Other primary ovarian failure: Secondary | ICD-10-CM | POA: Diagnosis not present

## 2017-09-24 DIAGNOSIS — M8588 Other specified disorders of bone density and structure, other site: Secondary | ICD-10-CM | POA: Insufficient documentation

## 2017-10-23 ENCOUNTER — Other Ambulatory Visit: Payer: Self-pay | Admitting: Family Medicine

## 2017-10-23 DIAGNOSIS — I10 Essential (primary) hypertension: Secondary | ICD-10-CM

## 2017-10-23 DIAGNOSIS — Z0000 Encounter for general adult medical examination without abnormal findings: Secondary | ICD-10-CM

## 2017-12-03 ENCOUNTER — Encounter: Payer: Self-pay | Admitting: Family Medicine

## 2017-12-03 ENCOUNTER — Telehealth: Payer: Self-pay

## 2017-12-03 ENCOUNTER — Other Ambulatory Visit: Payer: Self-pay | Admitting: Family Medicine

## 2017-12-03 DIAGNOSIS — I10 Essential (primary) hypertension: Secondary | ICD-10-CM

## 2017-12-03 NOTE — Telephone Encounter (Signed)
[facility]. Form completed and faxed to [phone]. Awaiting determination.

## 2017-12-03 NOTE — Telephone Encounter (Signed)
PA denied. Preferred alternatives: celecoxib, diclofenac sodium tablet, delayed release, ibuprofen tablet, meloxicam tablet, naproxen tablet.

## 2018-01-07 ENCOUNTER — Other Ambulatory Visit: Payer: Self-pay | Admitting: Family Medicine

## 2018-01-07 DIAGNOSIS — I10 Essential (primary) hypertension: Secondary | ICD-10-CM

## 2018-03-15 ENCOUNTER — Telehealth: Payer: Self-pay | Admitting: Family Medicine

## 2018-03-15 NOTE — Telephone Encounter (Signed)
Copied from CRM [id number]. Topic: Quick Communication - Rx Refill/Question >> Mar 15, 2018  4:43 PM Landry Mellow wrote: Medication: estradiol (ESTRACE) 0.1 MG/GM vaginal cream Has the patient contacted their pharmacy? No. (Agent: If no, request that the patient contact the pharmacy for the refill.) Preferred Pharmacy (with phone number or street name): [facility] [email or web address]  Agent: Please be advised that RX refills may take up to 3 business days. We ask that you follow-up with your pharmacy. This is not covered at current pharm - needs sent to [facility], would like 90 day supply

## 2018-03-18 ENCOUNTER — Other Ambulatory Visit: Payer: Self-pay

## 2018-03-18 DIAGNOSIS — N952 Postmenopausal atrophic vaginitis: Secondary | ICD-10-CM

## 2018-03-18 MED ORDER — ESTRADIOL 0.1 MG/GM VA CREA: TOPICAL_CREAM | VAGINAL | 2 refills | Status: DC

## 2018-03-20 ENCOUNTER — Encounter: Payer: Self-pay | Admitting: Family Medicine

## 2018-03-20 ENCOUNTER — Other Ambulatory Visit: Payer: Self-pay | Admitting: Emergency Medicine

## 2018-03-20 DIAGNOSIS — N952 Postmenopausal atrophic vaginitis: Secondary | ICD-10-CM

## 2018-03-27 MED ORDER — ESTRADIOL 0.1 MG/GM VA CREA: TOPICAL_CREAM | VAGINAL | 0 refills | Status: DC

## 2018-07-18 ENCOUNTER — Other Ambulatory Visit: Payer: Self-pay | Admitting: Family Medicine

## 2018-07-18 DIAGNOSIS — N952 Postmenopausal atrophic vaginitis: Secondary | ICD-10-CM

## 2018-08-27 ENCOUNTER — Encounter: Payer: Self-pay | Admitting: Family Medicine

## 2018-09-13 ENCOUNTER — Ambulatory Visit: Payer: Medicare HMO | Admitting: *Deleted

## 2018-09-27 ENCOUNTER — Other Ambulatory Visit: Payer: Self-pay | Admitting: Family Medicine

## 2018-09-27 DIAGNOSIS — N952 Postmenopausal atrophic vaginitis: Secondary | ICD-10-CM

## 2018-09-29 NOTE — Progress Notes (Addendum)
[facility] - Closed [address] [place], [place] [address] [phone]  Date:  10/02/2018   Name:  Jeanette Mckee   DOB:  07-27-1951   MRN:  [id number]  PCP:  Pearline Cables, MD    Chief Complaint: Annual Exam (medication alternatives for estradiol, declines flu shot)   History of Present Illness:  Jeanette Mckee is a 67 y.o. very pleasant female patient who presents with the following:  Here today for a CPE and also wishes to discuss medications She has a history of HTN Flu: today  Tdap:she is not sure of date, likely due.  She got bitten by a horse recently so will boost her Colon: per Dr. Loreta Ave 2 years ago, will request- per pt she actually did a cologuard in 2017  mammo just about a year ago- she will have this done once her horse bite is healed  She is fasting today   Last seen here about a year ago - at that time she had c/o SOB with exercise and I ordered a stress test for her- she did not have this done but her sx resolved She no longer notes any SOB- thinks that she may have been symptomatic due to exposure to others vaping. 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 This is healing  Her estradiol cream is expensive- she is getting this from [facility] right now She is using the 0.1mg /gm strength, 1-2 grams 3x a week She is s/p hysterectomy in 1996  She would like to try an alternative to this drug as it is expensive but works well. [facility] and they can compound a 0.2mg /gm strength for her  Patient Active Problem List   Diagnosis Date Noted  . Cold sore 04/19/2014  . 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 Other and unspecified hyperlipidemia 02/11/2013  . Preventative health  care 02/11/2013  . HTN (hypertension) 02/04/2012  . Abnormal liver enzymes 02/04/2012  . Chronic back pain 02/04/2012    Past Medical History:  Diagnosis Date  . History of chicken pox    childhood  . Hypertension   . Measles as a child  . Other and unspecified hyperlipidemia 02/11/2013  . Preventative health care 02/11/2013    Past Surgical History:  Procedure Laterality Date  . ABDOMINAL HYSTERECTOMY  12/1994   and RSO & LS    Social History   Tobacco Use  . Smoking status: Former Smoker    Years: 20.00    Last attempt to quit: 11/13/2001    Years since quitting: 16.8  . Smokeless tobacco: Never Used  . Tobacco comment: quit in 2003 1 ppd for 20 years  Substance Use Topics  . Alcohol use: Yes    Comment: WINE FRIDAY AND SATURDAY  . Drug use: No    Family History  Problem Relation Age of Onset  . Alcohol abuse Father   . Heart disease Father   . Stroke Father   . Hypertension Father   . Diabetes Father        type 2  . Cancer Father        prostate  . Heart disease Mother   .  Hypertension Mother   . Arthritis Mother   . Heart attack Mother 19  . Crohn's disease Mother   . Cancer Maternal Grandmother        bone- nonsmoker  . Heart disease Son        MI s/p 3 stents, January 2015  . Breast cancer Neg Hx   . Colon cancer Neg Hx     No Known Allergies  Medication list has been reviewed and updated.  Current Outpatient Medications on File Prior to Visit  Medication Sig Dispense Refill  . diclofenac (CATAFLAM) 50 MG tablet TAKE 1 TABLET  (50MG   TOTAL) TWICE DAILY AS NEEDED 180 tablet 1  . Multiple Vitamin (MULTIVITAMIN) tablet Take 1 tablet by mouth daily.    . Omega-3 Fatty Acids (FISH OIL) 1000 MG CAPS Take 1 capsule by mouth daily.     No current facility-administered medications on file prior to visit.     Review of Systems:  As per HPI- otherwise negative. No fever or chills No CP or SOB   Physical Examination: Vitals:   10/02/18 0857  BP:  130/80  Pulse: 65  Resp: 16  Temp: 98.2 F (36.8 C)  SpO2: 95%   Vitals:   10/02/18 0857  Weight: 182 lb (82.6 kg)  Height: 5' 5.75" (1.67 m)   Body mass index is 29.6 kg/m. Ideal Body Weight: Weight in (lb) to have BMI = 25: 153.4  GEN: WDWN, NAD, Non-toxic, A & O x 3, overweight, looks well  HEENT: Atraumatic, Normocephalic. Neck supple. No masses, No LAD. Bilateral TM wnl, oropharynx normal.  PEERL,EOMI.   Ears and Nose: No external deformity. CV: RRR, No M/G/R. No JVD. No thrill. No extra heart sounds. PULM: CTA B, no wheezes, crackles, rhonchi. No retractions. No resp. distress. No accessory muscle use. ABD: S, NT, ND, +BS. No rebound. No HSM. EXTR: No c/c/e NEURO Normal gait.  PSYCH: Normally interactive. Conversant. Not depressed or anxious appearing.  Calm demeanor.  Breast: normal exam, no masses/ dimpling/ discharge Large bruise on superior part of right breast from recent bite. Healing      Assessment and Plan: Physical exam  Screening for deficiency anemia - Plan: CBC  Screening for diabetes mellitus - Plan: Comprehensive metabolic panel, Hemoglobin A1c  Mixed hyperlipidemia - Plan: Lipid panel  Immunization due  Essential hypertension - Plan: CBC, Comprehensive metabolic panel, amLODipine (NORVASC) 5 MG tablet  Accelerated hypertension - Plan: lisinopril-hydrochlorothiazide (PRINZIDE,ZESTORETIC) 20-12.5 MG tablet  Wound of right breast, initial encounter - Plan: Tdap vaccine greater than or equal to 7yo IM  Vaginal atrophy - Plan: estradiol (ESTRACE) 0.1 MG/GM vaginal cream  Need for influenza vaccination - Plan: Flu vaccine HIGH DOSE PF (Fluzone High dose)  CPE today  Flu and tetanus shots given Gave letter rx for custom estrogen cream for her to take the [employer] plan further follow- up pending labs. With labs, remind pt to take a 1/2 gram of cream generlaly   Signed Abbe Amsterdam, MD  Received her labs, message to pt  Results for  orders placed or performed in visit on 10/02/18  CBC  Result Value Ref Range   WBC 5.9 4.0 - 10.5 K/uL   RBC 4.72 3.87 - 5.11 Mil/uL   Platelets 274.0 150.0 - 400.0 K/uL   Hemoglobin 14.6 12.0 - 15.0 g/dL   HCT 16.1 09.6 - 04.5 %   MCV 89.5 78.0 - 100.0 fl   MCHC 34.4 30.0 - 36.0 g/dL  RDW 13.4 11.5 - 15.5 %  Comprehensive metabolic panel  Result Value Ref Range   Sodium 135 135 - 145 mEq/L   Potassium 4.2 3.5 - 5.1 mEq/L   Chloride 101 96 - 112 mEq/L   CO2 26 19 - 32 mEq/L   Glucose, Bld 103 (H) 70 - 99 mg/dL   BUN 15 6 - 23 mg/dL   Creatinine, Ser 1.610.57 0.40 - 1.20 mg/dL   Total Bilirubin 0.6 0.2 - 1.2 mg/dL   Alkaline Phosphatase 102 39 - 117 U/L   AST 22 0 - 37 U/L   ALT 26 0 - 35 U/L   Total Protein 7.4 6.0 - 8.3 g/dL   Albumin 4.3 3.5 - 5.2 g/dL   Calcium 9.7 8.4 - 09.610.5 mg/dL   GFR 045.40111.74 >98.11>60.00 mL/min  Hemoglobin A1c  Result Value Ref Range   Hgb A1c MFr Bld 5.4 4.6 - 6.5 %  Lipid panel  Result Value Ref Range   Cholesterol 214 (H) 0 - 200 mg/dL   Triglycerides 91.456.0 0.0 - 149.0 mg/dL   HDL 78.2974.10 >56.21>39.00 mg/dL   VLDL 30.811.2 0.0 - 65.740.0 mg/dL   LDL Cholesterol 846129 (H) 0 - 99 mg/dL   Total CHOL/HDL Ratio 3    NonHDL 140.35     Blood counts are normal Metabolic profile is normal A1c does NOT show diabetes Cholesterol is overall good,but your 10 year risk of heart disease is a bit higher than I would like, as below:  The 10-year ASCVD risk score Denman George(Goff DC Jr., et al., 2013) is: 11.2%   Values used to calculate the score:     Age: [age] years     Sex: Female     Is Non-Hispanic African American: No     Diabetic: No     Tobacco smoker: No     Systolic Blood Pressure: 130 mmHg     Is BP treated: Yes     HDL Cholesterol: 74.1 mg/dL     Total Cholesterol: 214 mg/dL  We might consider a cholesterol med to reduce this risk.  Are you interested in this?  Also, I wanted to remind you to try and use a 1/2 gram of the new cream as it is twice as strong. We want to try and  keep this dose low! Best,

## 2018-10-02 ENCOUNTER — Encounter: Payer: Self-pay | Admitting: Family Medicine

## 2018-10-02 ENCOUNTER — Ambulatory Visit (INDEPENDENT_AMBULATORY_CARE_PROVIDER_SITE_OTHER): Payer: Medicare HMO | Admitting: Family Medicine

## 2018-10-02 VITALS — BP 130/80 | HR 65 | Temp 98.2°F | Resp 16 | Ht 65.75 in | Wt 182.0 lb

## 2018-10-02 DIAGNOSIS — E782 Mixed hyperlipidemia: Secondary | ICD-10-CM

## 2018-10-02 DIAGNOSIS — N952 Postmenopausal atrophic vaginitis: Secondary | ICD-10-CM

## 2018-10-02 DIAGNOSIS — Z23 Encounter for immunization: Secondary | ICD-10-CM | POA: Diagnosis not present

## 2018-10-02 DIAGNOSIS — Z130 Encounter for screening for diseases of the blood and blood-forming organs and certain disorders involving the immune mechanism: Secondary | ICD-10-CM | POA: Diagnosis not present

## 2018-10-02 DIAGNOSIS — I10 Essential (primary) hypertension: Secondary | ICD-10-CM | POA: Diagnosis not present

## 2018-10-02 DIAGNOSIS — Z131 Encounter for screening for diabetes mellitus: Secondary | ICD-10-CM

## 2018-10-02 DIAGNOSIS — Z0000 Encounter for general adult medical examination without abnormal findings: Secondary | ICD-10-CM

## 2018-10-02 DIAGNOSIS — S21001A Unspecified open wound of right breast, initial encounter: Secondary | ICD-10-CM | POA: Diagnosis not present

## 2018-10-02 LAB — COMPREHENSIVE METABOLIC PANEL
ALT: 26 U/L (ref 0–35)
AST: 22 U/L (ref 0–37)
Albumin: 4.3 g/dL (ref 3.5–5.2)
Alkaline Phosphatase: 102 U/L (ref 39–117)
BUN: 15 mg/dL (ref 6–23)
CO2: 26 mEq/L (ref 19–32)
Calcium: 9.7 mg/dL (ref 8.4–10.5)
Chloride: 101 mEq/L (ref 96–112)
Creatinine, Ser: 0.57 mg/dL (ref 0.40–1.20)
GFR: 111.74 mL/min (ref >60.00)
Glucose, Bld: 103 mg/dL — ABNORMAL HIGH (ref 70–99)
Potassium: 4.2 mEq/L (ref 3.5–5.1)
Sodium: 135 mEq/L (ref 135–145)
Total Bilirubin: 0.6 mg/dL (ref 0.2–1.2)
Total Protein: 7.4 g/dL (ref 6.0–8.3)

## 2018-10-02 LAB — HEMOGLOBIN A1C: Hgb A1c MFr Bld: 5.4 % (ref 4.6–6.5)

## 2018-10-02 LAB — CBC
HCT: 42.3 % (ref 36.0–46.0)
Hemoglobin: 14.6 g/dL (ref 12.0–15.0)
MCHC: 34.4 g/dL (ref 30.0–36.0)
MCV: 89.5 fl (ref 78.0–100.0)
Platelets: 274 10*3/uL (ref 150.0–400.0)
RBC: 4.72 Mil/uL (ref 3.87–5.11)
RDW: 13.4 % (ref 11.5–15.5)
WBC: 5.9 10*3/uL (ref 4.0–10.5)

## 2018-10-02 LAB — LIPID PANEL
Cholesterol: 214 mg/dL — ABNORMAL HIGH (ref 0–200)
HDL: 74.1 mg/dL (ref >39.00)
LDL Cholesterol: 129 mg/dL — ABNORMAL HIGH (ref 0–99)
NonHDL: 140.35
Total CHOL/HDL Ratio: 3
Triglycerides: 56 mg/dL (ref 0.0–149.0)
VLDL: 11.2 mg/dL (ref 0.0–40.0)

## 2018-10-02 MED ORDER — ESTRADIOL 0.1 MG/GM VA CREA: TOPICAL_CREAM | VAGINAL | 3 refills | Status: DC

## 2018-10-02 MED ORDER — AMLODIPINE BESYLATE 5 MG PO TABS: 5.0000 mg | ORAL_TABLET | Freq: Every day | ORAL | 3 refills | Status: DC

## 2018-10-02 MED ORDER — LISINOPRIL-HYDROCHLOROTHIAZIDE 20-12.5 MG PO TABS: 2.0000 | ORAL_TABLET | Freq: Every day | ORAL | 3 refills | Status: DC

## 2018-10-02 NOTE — Patient Instructions (Addendum)
Great to see you today- take care and let me know if you have any concerns I will be in touch with your labs Flu and tetanus boosters given today [facility] or [place] may be able to compound your estrogen cream at a cheaper price. Please let me know if you have any questions about this!     Health Maintenance for Postmenopausal Women Menopause is a normal process in which your reproductive ability comes to an end. This process happens gradually over a span of months to years, usually between the ages of 67 and 27. Menopause is complete when you have missed 12 consecutive menstrual periods. It is important to talk with your health care provider about some of the most common conditions that affect postmenopausal women, such as heart disease, cancer, and bone loss (osteoporosis). Adopting a healthy lifestyle and getting preventive care can help to promote your health and wellness. Those actions can also lower your chances of developing some of these common conditions. What should I know about menopause? During menopause, you may experience a number of symptoms, such as:  Moderate-to-severe hot flashes.  Night sweats.  Decrease in sex drive.  Mood swings.  Headaches.  Tiredness.  Irritability.  Memory problems.  Insomnia.  Choosing to treat or not to treat menopausal changes is an individual decision that you make with your health care provider. What should I know about hormone replacement therapy and supplements? Hormone therapy products are effective for treating symptoms that are associated with menopause, such as hot flashes and night sweats. Hormone replacement carries certain risks, especially as you become older. If you are thinking about using estrogen or estrogen with progestin treatments, discuss the benefits and risks with your health care provider. What should I know about heart disease and stroke? Heart disease, heart attack, and stroke become more likely as you age.  This may be due, in part, to the hormonal changes that your body experiences during menopause. These can affect how your body processes dietary fats, triglycerides, and cholesterol. Heart attack and stroke are both medical emergencies. There are many things that you can do to help prevent heart disease and stroke:  Have your blood pressure checked at least every 1-2 years. High blood pressure causes heart disease and increases the risk of stroke.  If you are 35-22 years old, ask your health care provider if you should take aspirin to prevent a heart attack or a stroke.  Do not use any tobacco products, including cigarettes, chewing tobacco, or electronic cigarettes. If you need help quitting, ask your health care provider.  It is important to eat a healthy diet and maintain a healthy weight. ? Be sure to include plenty of vegetables, fruits, low-fat dairy products, and lean protein. ? Avoid eating foods that are high in solid fats, added sugars, or salt (sodium).  Get regular exercise. This is one of the most important things that you can do for your health. ? Try to exercise for at least 150 minutes each week. The type of exercise that you do should increase your heart rate and make you sweat. This is known as moderate-intensity exercise. ? Try to do strengthening exercises at least twice each week. Do these in addition to the moderate-intensity exercise.  Know your numbers.Ask your health care provider to check your cholesterol and your blood glucose. Continue to have your blood tested as directed by your health care provider.  What should I know about cancer screening? There are several types of  cancer. Take the following steps to reduce your risk and to catch any cancer development as early as possible. Breast Cancer  Practice breast self-awareness. ? This means understanding how your breasts normally appear and feel. ? It also means doing regular breast self-exams. Let your health care  provider know about any changes, no matter how small.  If you are 14 or older, have a clinician do a breast exam (clinical breast exam or CBE) every year. Depending on your age, family history, and medical history, it may be recommended that you also have a yearly breast X-ray (mammogram).  If you have a family history of breast cancer, talk with your health care provider about genetic screening.  If you are at high risk for breast cancer, talk with your health care provider about having an MRI and a mammogram every year.  Breast cancer (BRCA) gene test is recommended for women who have family members with BRCA-related cancers. Results of the assessment will determine the need for genetic counseling and BRCA1 and for BRCA2 testing. BRCA-related cancers include these types: ? Breast. This occurs in males or females. ? Ovarian. ? Tubal. This may also be called fallopian tube cancer. ? Cancer of the abdominal or pelvic lining (peritoneal cancer). ? Prostate. ? Pancreatic.  Cervical, Uterine, and Ovarian Cancer Your health care provider may recommend that you be screened regularly for cancer of the pelvic organs. These include your ovaries, uterus, and vagina. This screening involves a pelvic exam, which includes checking for microscopic changes to the surface of your cervix (Pap test).  For women ages 21-65, health care providers may recommend a pelvic exam and a Pap test every three years. For women ages 67-65, they may recommend the Pap test and pelvic exam, combined with testing for human papilloma virus (HPV), every five years. Some types of HPV increase your risk of cervical cancer. Testing for HPV may also be done on women of any age who have unclear Pap test results.  Other health care providers may not recommend any screening for nonpregnant women who are considered low risk for pelvic cancer and have no symptoms. Ask your health care provider if a screening pelvic exam is right for  you.  If you have had past treatment for cervical cancer or a condition that could lead to cancer, you need Pap tests and screening for cancer for at least 20 years after your treatment. If Pap tests have been discontinued for you, your risk factors (such as having a new sexual partner) need to be reassessed to determine if you should start having screenings again. Some women have medical problems that increase the chance of getting cervical cancer. In these cases, your health care provider may recommend that you have screening and Pap tests more often.  If you have a family history of uterine cancer or ovarian cancer, talk with your health care provider about genetic screening.  If you have vaginal bleeding after reaching menopause, tell your health care provider.  There are currently no reliable tests available to screen for ovarian cancer.  Lung Cancer Lung cancer screening is recommended for adults 38-71 years old who are at high risk for lung cancer because of a history of smoking. A yearly low-dose CT scan of the lungs is recommended if you:  Currently smoke.  Have a history of at least 30 pack-years of smoking and you currently smoke or have quit within the past 15 years. A pack-year is smoking an average of one pack of  cigarettes per day for one year.  Yearly screening should:  Continue until it has been 15 years since you quit.  Stop if you develop a health problem that would prevent you from having lung cancer treatment.  Colorectal Cancer  This type of cancer can be detected and can often be prevented.  Routine colorectal cancer screening usually begins at age 109 and continues through age 50.  If you have risk factors for colon cancer, your health care provider may recommend that you be screened at an earlier age.  If you have a family history of colorectal cancer, talk with your health care provider about genetic screening.  Your health care provider may also recommend  using home test kits to check for hidden blood in your stool.  A small camera at the end of a tube can be used to examine your colon directly (sigmoidoscopy or colonoscopy). This is done to check for the earliest forms of colorectal cancer.  Direct examination of the colon should be repeated every 5-10 years until age 46. However, if early forms of precancerous polyps or small growths are found or if you have a family history or genetic risk for colorectal cancer, you may need to be screened more often.  Skin Cancer  Check your skin from head to toe regularly.  Monitor any moles. Be sure to tell your health care provider: ? About any new moles or changes in moles, especially if there is a change in a mole's shape or color. ? If you have a mole that is larger than the size of a pencil eraser.  If any of your family members has a history of skin cancer, especially at a young age, talk with your health care provider about genetic screening.  Always use sunscreen. Apply sunscreen liberally and repeatedly throughout the day.  Whenever you are outside, protect yourself by wearing long sleeves, pants, a wide-brimmed hat, and sunglasses.  What should I know about osteoporosis? Osteoporosis is a condition in which bone destruction happens more quickly than new bone creation. After menopause, you may be at an increased risk for osteoporosis. To help prevent osteoporosis or the bone fractures that can happen because of osteoporosis, the following is recommended:  If you are 34-40 years old, get at least 1,000 mg of calcium and at least 600 mg of vitamin D per day.  If you are older than age 104 but younger than age 49, get at least 1,200 mg of calcium and at least 600 mg of vitamin D per day.  If you are older than age 70, get at least 1,200 mg of calcium and at least 800 mg of vitamin D per day.  Smoking and excessive alcohol intake increase the risk of osteoporosis. Eat foods that are rich in  calcium and vitamin D, and do weight-bearing exercises several times each week as directed by your health care provider. What should I know about how menopause affects my mental health? Depression may occur at any age, but it is more common as you become older. Common symptoms of depression include:  Low or sad mood.  Changes in sleep patterns.  Changes in appetite or eating patterns.  Feeling an overall lack of motivation or enjoyment of activities that you previously enjoyed.  Frequent crying spells.  Talk with your health care provider if you think that you are experiencing depression. What should I know about immunizations? It is important that you get and maintain your immunizations. These include:  Tetanus, diphtheria,  and pertussis (Tdap) booster vaccine.  Influenza every year before the flu season begins.  Pneumonia vaccine.  Shingles vaccine.  Your health care provider may also recommend other immunizations. This information is not intended to replace advice given to you by your health care provider. Make sure you discuss any questions you have with your health care provider. Document Released: 12/22/2005 Document Revised: 05/19/2016 Document Reviewed: 08/03/2015 Elsevier Interactive Patient Education  2018 [employer].

## 2018-11-18 ENCOUNTER — Other Ambulatory Visit: Payer: Self-pay | Admitting: Family Medicine

## 2018-11-18 DIAGNOSIS — N952 Postmenopausal atrophic vaginitis: Secondary | ICD-10-CM

## 2018-11-18 DIAGNOSIS — Z0000 Encounter for general adult medical examination without abnormal findings: Secondary | ICD-10-CM

## 2018-11-18 DIAGNOSIS — I10 Essential (primary) hypertension: Secondary | ICD-10-CM

## 2018-11-18 NOTE — Telephone Encounter (Signed)
Copied from CRM [id number]. Topic: Quick Communication - Rx Refill/Question >> Nov 18, 2018  4:35 PM Mickel Baas B, NT wrote: **First 3 medications to go to [facility] and the last medication (Estridiol) to go to [facility].**  Medication: lisinopril-hydrochlorothiazide (PRINZIDE,ZESTORETIC) 20-12.5 MG tablet amLODipine (NORVASC) 5 MG tablet diclofenac (CATAFLAM) 50 MG tablet  estradiol (ESTRACE) 0.1 MG/GM vaginal cream  Has the patient contacted their pharmacy? Yes. Call office because the new pharmacy does not have record.  (Agent: If no, request that the patient contact the pharmacy for the refill.) (Agent: If yes, when and what did the pharmacy advise?)  Preferred Pharmacy (with phone number or street name): [facility] - [place], [place] - [address]  Agent: Please be advised that RX refills may take up to 3 business days. We ask that you follow-up with your pharmacy.

## 2018-11-19 MED ORDER — DICLOFENAC POTASSIUM 50 MG PO TABS: ORAL_TABLET | ORAL | 0 refills | Status: DC

## 2018-11-19 MED ORDER — AMLODIPINE BESYLATE 5 MG PO TABS: 5.0000 mg | ORAL_TABLET | Freq: Every day | ORAL | 0 refills | Status: DC

## 2018-11-19 MED ORDER — ESTRADIOL 0.1 MG/GM VA CREA: TOPICAL_CREAM | VAGINAL | 3 refills | Status: DC

## 2018-11-19 MED ORDER — LISINOPRIL-HYDROCHLOROTHIAZIDE 20-12.5 MG PO TABS
2.0000 | ORAL_TABLET | Freq: Every day | ORAL | 0 refills | Status: DC
Start: 2018-11-19 — End: 2019-02-12

## 2018-11-19 NOTE — Telephone Encounter (Signed)
Requested Prescriptions  Pending Prescriptions Disp Refills  . amLODipine (NORVASC) 5 MG tablet 90 tablet 3    Sig: Take 1 tablet (5 mg total) by mouth daily.     Cardiovascular:  Calcium Channel Blockers Passed - 11/19/2018  7:07 AM      Passed - Last BP in normal range    BP Readings from Last 1 Encounters:  10/02/18 130/80         Passed - Valid encounter within last 6 months    Recent Outpatient Visits          1 month ago Physical exam   Holiday representative at [facility] [place], Jeanette Found, MD   1 year ago Physical exam   Holiday representative at [facility] [place], Jeanette Found, MD   1 year ago Bronchitis   Holiday representative at [facility] [place], [place], [place]   2 years ago Essential hypertension   Holiday representative at [facility], Jeanette Found, MD   2 years ago Essential hypertension   Holiday representative at [employer], Jeanette Found, MD           . lisinopril-hydrochlorothiazide (PRINZIDE,ZESTORETIC) 20-12.5 MG tablet 180 tablet 3    Sig: Take 2 tablets by mouth daily.     Cardiovascular:  ACEI + Diuretic Combos Passed - 11/19/2018  7:07 AM      Passed - Na in normal range and within 180 days    Sodium  Date Value Ref Range Status  10/02/2018 135 135 - 145 mEq/L Final         Passed - K in normal range and within 180 days    Potassium  Date Value Ref Range Status  10/02/2018 4.2 3.5 - 5.1 mEq/L Final         Passed - Cr in normal range and within 180 days    Creat  Date Value Ref Range Status  04/08/2014 0.67 0.50 - 1.10 mg/dL Final   Creatinine, Ser  Date Value Ref Range Status  10/02/2018 0.57 0.40 - 1.20 mg/dL Final         Passed - Ca in normal range and within 180 days    Calcium  Date Value Ref Range Status  10/02/2018 9.7 8.4 - 10.5 mg/dL Final         Passed - Patient is not pregnant      Passed - Last BP in normal range    BP  Readings from Last 1 Encounters:  10/02/18 130/80         Passed - Valid encounter within last 6 months    Recent Outpatient Visits          1 month ago Physical exam   Holiday representative at [facility] [place], Jeanette Found, MD   1 year ago Physical exam   Holiday representative at [facility] [place], Jeanette Found, MD   1 year ago Bronchitis   Holiday representative at [employer] [facility], [place], [place]   2 years ago Essential hypertension   Holiday representative at [facility], Jeanette Found, MD   2 years ago Essential hypertension   Holiday representative at [employer], [place] C, MD           . diclofenac (CATAFLAM) 50 MG tablet 180 tablet 1     Analgesics:  NSAIDS Passed - 11/19/2018  7:07 AM      Passed - Cr in normal range and within 360 days    Creat  Date Value Ref Range Status  04/08/2014 0.67 0.50 - 1.10 mg/dL Final   Creatinine, Ser  Date Value Ref Range Status  10/02/2018 0.57 0.40 - 1.20 mg/dL Final         Passed - HGB in normal range and within 360 days    Hemoglobin  Date Value Ref Range Status  10/02/2018 14.6 12.0 - 15.0 g/dL Final         Passed - Patient is not pregnant      Passed - Valid encounter within last 12 months    Recent Outpatient Visits          1 month ago [facility] exam   [facility] [place] Jeanette FoundJessica C, MD   1 year ago Physical exam   [facility] [place], Jeanette FoundJessica C, MD   1 year ago Bronchitis   [facility] [place]   2 years ago Essential hypertension   [facility] [place], [place], [place]   2 years ago Essential hypertension   [facility] [place], [place], [place]           . estradiol (ESTRACE) 0.1 MG/GM vaginal cream  42.5 g 3    Sig: Apply 1-2 grams up to 3 times a week as needed     OB/GYN:  Estrogens Passed - 11/19/2018  7:07 AM      Passed - Mammogram is up-to-date per Health Maintenance      Passed - Last BP in normal range    BP Readings from Last 1 Encounters:  10/02/18 130/80         Passed - Valid encounter within last 12 months    Recent Outpatient Visits          1 month ago Physical exam   [facility] [place] Jeanette FoundJessica C, MD   1 year ago Physical exam   [facility] [place], Jeanette FoundJessica C, MD   1 year ago Bronchitis   [facility] [place], [place], [place]   2 years ago Essential hypertension   [facility] [place], Jeanette FoundJessica C, MD   2 years ago Essential hypertension   [facility] [place], Jeanette FoundJessica C, MD

## 2018-11-19 NOTE — Telephone Encounter (Signed)
Requested Prescriptions  Pending Prescriptions Disp Refills  . amLODipine (NORVASC) 5 MG tablet 90 tablet 0    Sig: Take 1 tablet (5 mg total) by mouth daily.     Cardiovascular:  Calcium Channel Blockers Passed - 11/19/2018  7:07 AM      Passed - Last BP in normal range    BP Readings from Last 1 Encounters:  10/02/18 130/80         Passed - Valid encounter within last 6 months    Recent Outpatient Visits          1 month ago Physical exam   Holiday representative at [facility] [place], Gwenlyn Found, MD   1 year ago Physical exam   Holiday representative at [facility] [place], Gwenlyn Found, MD   1 year ago Bronchitis   Holiday representative at [facility] [place], [place], [place]   2 years ago Essential hypertension   Holiday representative at [employer], Gwenlyn Found, MD   2 years ago Essential hypertension   Holiday representative at [employer], Gwenlyn Found, MD           . lisinopril-hydrochlorothiazide (PRINZIDE,ZESTORETIC) 20-12.5 MG tablet 180 tablet 3    Sig: Take 2 tablets by mouth daily.     Cardiovascular:  ACEI + Diuretic Combos Passed - 11/19/2018  7:07 AM      Passed - Na in normal range and within 180 days    Sodium  Date Value Ref Range Status  10/02/2018 135 135 - 145 mEq/L Final         Passed - K in normal range and within 180 days    Potassium  Date Value Ref Range Status  10/02/2018 4.2 3.5 - 5.1 mEq/L Final         Passed - Cr in normal range and within 180 days    Creat  Date Value Ref Range Status  04/08/2014 0.67 0.50 - 1.10 mg/dL Final   Creatinine, Ser  Date Value Ref Range Status  10/02/2018 0.57 0.40 - 1.20 mg/dL Final         Passed - Ca in normal range and within 180 days    Calcium  Date Value Ref Range Status  10/02/2018 9.7 8.4 - 10.5 mg/dL Final         Passed - Patient is not pregnant      Passed - Last BP in normal range    BP  Readings from Last 1 Encounters:  10/02/18 130/80         Passed - Valid encounter within last 6 months    Recent Outpatient Visits          1 month ago Physical exam   Holiday representative at [facility] [place], Gwenlyn Found, MD   1 year ago Physical exam   Holiday representative at [facility] [place], Gwenlyn Found, MD   1 year ago Bronchitis   Holiday representative at [employer], [place], [place]   2 years ago Essential hypertension   Holiday representative at [employer], Gwenlyn Found, MD   2 years ago Essential hypertension   Holiday representative at [employer], [place] C, MD           . diclofenac (CATAFLAM) 50 MG tablet 180 tablet 1     Analgesics:  NSAIDS Passed - 11/19/2018  7:07 AM      Passed - Cr in normal range and within 360 days    Creat  Date Value Ref Range Status  04/08/2014 0.67 0.50 - 1.10 mg/dL Final   Creatinine, Ser  Date Value Ref Range Status  10/02/2018 0.57 0.40 - 1.20 mg/dL Final         Passed - HGB in normal range and within 360 days    Hemoglobin  Date Value Ref Range Status  10/02/2018 14.6 12.0 - 15.0 g/dL Final         Passed - Patient is not pregnant      Passed - Valid encounter within last 12 months    Recent Outpatient Visits          1 month ago Physical exam   Holiday representative at [facility] [place], Gwenlyn Found, MD   1 year ago Physical exam   Holiday representative at [facility] [place], Gwenlyn Found, MD   1 year ago Bronchitis   Holiday representative at [employer] [facility], [place], [place]   2 years ago Essential hypertension   Holiday representative at [employer] [facility], [place] C, MD   2 years ago Essential hypertension   Holiday representative at [employer], Gwenlyn Found, MD           Signed Prescriptions Disp Refills    estradiol (ESTRACE) 0.1 MG/GM vaginal cream 42.5 g 3    Sig: Apply 1-2 grams up to 3 times a week as needed     OB/GYN:  Estrogens Passed - 11/19/2018  7:07 AM      Passed - Mammogram is up-to-date per Health Maintenance      Passed - Last BP in normal range    BP Readings from Last 1 Encounters:  10/02/18 130/80         Passed - Valid encounter within last 12 months    Recent Outpatient Visits          1 month ago Physical exam   Holiday representative at [facility] [place], Gwenlyn Found, MD   1 year ago Physical exam   Holiday representative at [facility] [place], Gwenlyn Found, MD   1 year ago Bronchitis   Holiday representative at [employer], [place], [place]   2 years ago Essential hypertension   Holiday representative at [facility], Gwenlyn Found, MD   2 years ago Essential hypertension   Holiday representative at [facility] [place], Gwenlyn Found, MD

## 2019-02-12 ENCOUNTER — Other Ambulatory Visit: Payer: Self-pay | Admitting: Family Medicine

## 2019-02-12 DIAGNOSIS — Z0000 Encounter for general adult medical examination without abnormal findings: Secondary | ICD-10-CM

## 2019-02-12 DIAGNOSIS — I10 Essential (primary) hypertension: Secondary | ICD-10-CM

## 2019-02-13 ENCOUNTER — Encounter: Payer: Self-pay | Admitting: Family Medicine

## 2019-02-13 DIAGNOSIS — N952 Postmenopausal atrophic vaginitis: Secondary | ICD-10-CM

## 2019-02-13 MED ORDER — LISINOPRIL-HYDROCHLOROTHIAZIDE 20-12.5 MG PO TABS: 2.0000 | ORAL_TABLET | Freq: Every day | ORAL | 3 refills | Status: DC

## 2019-02-13 MED ORDER — AMLODIPINE BESYLATE 5 MG PO TABS: 5.0000 mg | ORAL_TABLET | Freq: Every day | ORAL | 3 refills | Status: DC

## 2019-02-13 MED ORDER — DICLOFENAC POTASSIUM 50 MG PO TABS: ORAL_TABLET | ORAL | 1 refills | Status: DC

## 2019-02-14 MED ORDER — ESTRADIOL 0.1 MG/GM VA CREA: TOPICAL_CREAM | VAGINAL | 3 refills | Status: DC

## 2019-03-24 ENCOUNTER — Telehealth: Payer: Self-pay | Admitting: *Deleted

## 2019-03-24 NOTE — Telephone Encounter (Signed)
Received Medical records from [facility]; forwarded to provider/SLS 05/11

## 2019-04-02 ENCOUNTER — Encounter: Payer: Self-pay | Admitting: Family Medicine

## 2019-05-08 ENCOUNTER — Encounter: Payer: Self-pay | Admitting: Family Medicine

## 2019-05-08 NOTE — Telephone Encounter (Signed)
In response to my previous message she replied.Patient notified-not Sure where else she cold get a letter?

## 2019-06-22 ENCOUNTER — Encounter: Payer: Self-pay | Admitting: Family Medicine

## 2019-06-24 ENCOUNTER — Other Ambulatory Visit: Payer: Self-pay | Admitting: Family Medicine

## 2019-06-24 DIAGNOSIS — N952 Postmenopausal atrophic vaginitis: Secondary | ICD-10-CM

## 2019-06-25 ENCOUNTER — Encounter: Payer: Self-pay | Admitting: Family Medicine

## 2019-06-25 DIAGNOSIS — L989 Disorder of the skin and subcutaneous tissue, unspecified: Secondary | ICD-10-CM

## 2019-06-30 ENCOUNTER — Other Ambulatory Visit: Payer: Self-pay | Admitting: Family Medicine

## 2019-06-30 DIAGNOSIS — Z0000 Encounter for general adult medical examination without abnormal findings: Secondary | ICD-10-CM

## 2019-07-14 DIAGNOSIS — Z1212 Encounter for screening for malignant neoplasm of rectum: Secondary | ICD-10-CM | POA: Diagnosis not present

## 2019-07-14 DIAGNOSIS — Z1211 Encounter for screening for malignant neoplasm of colon: Secondary | ICD-10-CM | POA: Diagnosis not present

## 2019-07-15 ENCOUNTER — Encounter: Payer: Self-pay | Admitting: Family Medicine

## 2019-07-18 LAB — COLOGUARD

## 2019-07-23 ENCOUNTER — Encounter: Payer: Self-pay | Admitting: Family Medicine

## 2019-08-26 ENCOUNTER — Other Ambulatory Visit: Payer: Self-pay | Admitting: Family Medicine

## 2019-08-26 DIAGNOSIS — N952 Postmenopausal atrophic vaginitis: Secondary | ICD-10-CM

## 2019-09-20 ENCOUNTER — Other Ambulatory Visit: Payer: Self-pay | Admitting: Family Medicine

## 2019-09-22 ENCOUNTER — Encounter: Payer: Self-pay | Admitting: Family Medicine

## 2019-09-22 DIAGNOSIS — Z0000 Encounter for general adult medical examination without abnormal findings: Secondary | ICD-10-CM

## 2019-09-22 MED ORDER — DICLOFENAC POTASSIUM 50 MG PO TABS: ORAL_TABLET | ORAL | 0 refills | Status: DC

## 2019-10-07 ENCOUNTER — Other Ambulatory Visit: Payer: Self-pay

## 2019-10-07 NOTE — Patient Instructions (Addendum)
It was great to see you again today, I will be in touch with your labs ASAP It appears that you are due for a mammogram and bone density scan; ordered for you today!  Flu shot today  Please also consider having the shingles vaccine given at your drugstore- this is a 2 shot series   Health Maintenance After Age 70 After age 41, you are at a higher risk for certain long-term diseases and infections as well as injuries from falls. Falls are a major cause of broken bones and head injuries in people who are older than age 65. Getting regular preventive care can help to keep you healthy and well. Preventive care includes getting regular testing and making lifestyle changes as recommended by your health care provider. Talk with your health care provider about:  Which screenings and tests you should have. A screening is a test that checks for a disease when you have no symptoms.  A diet and exercise plan that is right for you. What should I know about screenings and tests to prevent falls? Screening and testing are the best ways to find a health problem early. Early diagnosis and treatment give you the best chance of managing medical conditions that are common after age 62. Certain conditions and lifestyle choices may make you more likely to have a fall. Your health care provider may recommend:  Regular vision checks. Poor vision and conditions such as cataracts can make you more likely to have a fall. If you wear glasses, make sure to get your prescription updated if your vision changes.  Medicine review. Work with your health care provider to regularly review all of the medicines you are taking, including over-the-counter medicines. Ask your health care provider about any side effects that may make you more likely to have a fall. Tell your health care provider if any medicines that you take make you feel dizzy or sleepy.  Osteoporosis screening. Osteoporosis is a condition that causes the bones to get  weaker. This can make the bones weak and cause them to break more easily.  Blood pressure screening. Blood pressure changes and medicines to control blood pressure can make you feel dizzy.  Strength and balance checks. Your health care provider may recommend certain tests to check your strength and balance while standing, walking, or changing positions.  Foot health exam. Foot pain and numbness, as well as not wearing proper footwear, can make you more likely to have a fall.  Depression screening. You may be more likely to have a fall if you have a fear of falling, feel emotionally low, or feel unable to do activities that you used to do.  Alcohol use screening. Using too much alcohol can affect your balance and may make you more likely to have a fall. What actions can I take to lower my risk of falls? General instructions  Talk with your health care provider about your risks for falling. Tell your health care provider if: ? You fall. Be sure to tell your health care provider about all falls, even ones that seem minor. ? You feel dizzy, sleepy, or off-balance.  Take over-the-counter and prescription medicines only as told by your health care provider. These include any supplements.  Eat a healthy diet and maintain a healthy weight. A healthy diet includes low-fat dairy products, low-fat (lean) meats, and fiber from whole grains, beans, and lots of fruits and vegetables. Home safety  Remove any tripping hazards, such as rugs, cords, and clutter.  Install safety equipment such as grab bars in bathrooms and safety rails on stairs.  Keep rooms and walkways well-lit. Activity   Follow a regular exercise program to stay fit. This will help you maintain your balance. Ask your health care provider what types of exercise are appropriate for you.  If you need a cane or walker, use it as recommended by your health care provider.  Wear supportive shoes that have nonskid soles. Lifestyle  Do  not drink alcohol if your health care provider tells you not to drink.  If you drink alcohol, limit how much you have: ? 0-1 drink a day for women. ? 0-2 drinks a day for men.  Be aware of how much alcohol is in your drink. In the [place] one drink equals one typical bottle of beer (12 oz), one-half glass of wine (5 oz), or one shot of hard liquor (1 oz).  Do not use any products that contain nicotine or tobacco, such as cigarettes and e-cigarettes. If you need help quitting, ask your health care provider. Summary  Having a healthy lifestyle and getting preventive care can help to protect your health and wellness after age 56.  Screening and testing are the best way to find a health problem early and help you avoid having a fall. Early diagnosis and treatment give you the best chance for managing medical conditions that are more common for people who are older than age 59.  Falls are a major cause of broken bones and head injuries in people who are older than age 57. Take precautions to prevent a fall at home.  Work with your health care provider to learn what changes you can make to improve your health and wellness and to prevent falls. This information is not intended to replace advice given to you by your health care provider. Make sure you discuss any questions you have with your health care provider. Document Released: 09/12/2017 Document Revised: 02/20/2019 Document Reviewed: 09/12/2017 Elsevier Patient Education  2020 [employer].

## 2019-10-07 NOTE — Progress Notes (Addendum)
[facility] [address], [address] [place], [place] [address] [phone] [phone]  Date:  10/08/2019   Name:  Jeanette PoleBarbara A Spragg   DOB:  19-Dec-1950   MRN:  [id number]  PCP:  Pearline Cablesopland, Sheryl Towell C, MD    Chief Complaint: Annual Exam (flu shot)   History of Present Illness:  Jeanette PoleBarbara A Perdomo is a 68 y.o. 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 Cold sore 04/19/2014  . 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 Other and unspecified hyperlipidemia 02/11/2013  . Preventative health care 02/11/2013  . HTN (hypertension) 02/04/2012  . Abnormal liver enzymes 02/04/2012  . Chronic back pain 02/04/2012    Past Medical History:  Diagnosis Date  . History of chicken pox    childhood  . Hypertension   . Measles as a child  . Other and unspecified hyperlipidemia 02/11/2013  . Preventative health care 02/11/2013    Past Surgical History:   Procedure Laterality Date  . ABDOMINAL HYSTERECTOMY  12/1994   and RSO & LS    Social History   Tobacco Use  . Smoking status: Former Smoker    Years: 20.00    Quit date: 11/13/2001    Years since quitting: 17.9  . Smokeless tobacco: Never Used  . Tobacco comment: quit in 2003 1 ppd for 20 years  Substance Use Topics  . Alcohol use: Yes    Comment: WINE FRIDAY AND SATURDAY  . Drug use: No    Family History  Problem Relation Age of Onset  . Alcohol abuse Father   . Heart disease Father   . Stroke Father   . Hypertension Father   . Diabetes Father        type 2  . Cancer Father        prostate  . Heart disease Mother   . Hypertension Mother   . Arthritis Mother   . Heart attack Mother [id number]  . Crohn's disease Mother   . Cancer Maternal Grandmother        bone- nonsmoker  . Heart disease Son        MI s/p 3 stents, January 2015  . Breast cancer Neg Hx   . Colon cancer Neg Hx     No Known Allergies  Medication list has been reviewed and updated.  Current Outpatient Medications on File Prior to Visit  Medication Sig Dispense Refill  . amLODipine (NORVASC) 5 MG tablet TAKE  1 TABLET BY MOUTH  DAILY 90 tablet 1  . amLODipine (NORVASC) 5 MG tablet Take 1 tablet (5 mg total) by mouth daily. 90 tablet 3  . diclofenac (CATAFLAM) 50 MG tablet TAKE 1 TABLET BY MOUTH  TWICE DAILY AS NEEDED 180 tablet 0  . diclofenac (CATAFLAM) 50 MG tablet TAKE 1 TABLET BY MOUTH  TWICE A DAY AS NEEDED 60 tablet 0  . estradiol (ESTRACE) 0.1 MG/GM vaginal cream APPLY 1 TO 2 GRAMS  VAGINALLY UP TO 3 TIMES  WEEKLY AS NEEDED 85 g 3  . lisinopril-hydrochlorothiazide (PRINZIDE,ZESTORETIC) 20-12.5 MG tablet TAKE 2 TABLETS BY MOUTH  DAILY 180 tablet 1  . lisinopril-hydrochlorothiazide (PRINZIDE,ZESTORETIC) 20-12.5 MG tablet Take 2 tablets by mouth daily. 180 tablet 3  . Multiple Vitamin (MULTIVITAMIN) tablet Take 1 tablet by mouth daily.     No current facility-administered medications on file prior to  visit.     Review of Systems:  As per HPI- otherwise negative. Wt Readings from Last 3 Encounters:  10/08/19 186 lb (84.4 kg)  10/02/18 182 lb (82.6 kg)  09/12/17 203 lb 3.2 oz (92.2 kg)    No postmenopausal bleeding, no fever or chills.  She has gained back a bit of the weight she lost recently, she hopes to get this back off  Physical Examination: Vitals:   10/08/19 0834  BP: 138/80  Pulse: 65  Resp: 17  Temp: (!) 96.6 F (35.9 C)  SpO2: 98%   Vitals:   10/08/19 0834  Weight: 186 lb (84.4 kg)  Height: 5' 5.25" (1.657 m)   Body mass index is 30.72 kg/m. Ideal Body Weight: Weight in (lb) to have BMI = 25: 151.1  GEN: WDWN, NAD, Non-toxic, A & O x 3, overweight, looks well HEENT: Atraumatic, Normocephalic. Neck supple. No masses, No LAD.  TM within normal limits bilaterally Ears and Nose: No external deformity. CV: RRR, No M/G/R. No JVD. No thrill. No extra heart sounds. PULM: CTA B, no wheezes, crackles, rhonchi. No retractions. No resp. distress. No accessory muscle use. ABD: S, NT, ND, +BS. No rebound. No HSM. EXTR: No c/c/e NEURO Normal gait.  PSYCH: Normally interactive. Conversant. Not depressed or anxious appearing.  Calm demeanor.    Assessment and Plan: Physical exam  Mixed hyperlipidemia - Plan: Lipid panel  Screening for diabetes mellitus - Plan: Comprehensive metabolic panel, Hemoglobin A1c  Screening for deficiency anemia - Plan: CBC  Essential hypertension  Vaginal atrophy  Screening for thyroid disorder - Plan: TSH  Encounter for screening mammogram for malignant neoplasm of breast - Plan: MM DIAG BREAST TOMO BILATERAL  Estrogen deficiency - Plan: DG Bone Density  Here today for routine physical Ordered mammogram and bone density Labs pending as above Blood pressure well controlled on amlodipine and lisinopril HCTZ She uses low-dose vaginal estrogen cream for atrophy Health maintenance reviewed Will plan further follow- up pending  labs.  This visit occurred during the SARS-CoV-2 public health emergency.  Safety protocols were in place, including screening questions prior to the visit, additional usage of staff PPE, and extensive cleaning of exam room while observing appropriate contact time as indicated for disinfecting solutions.    Signed Lamar Blinks, MD  Received her labs 11/26-message to patient  Results for orders placed or performed in visit on 10/08/19  CBC  Result Value Ref Range   WBC 6.0 4.0 - 10.5 K/uL   RBC 4.61 3.87 - 5.11 Mil/uL   Platelets 272.0 150.0 - 400.0 K/uL   Hemoglobin 14.2  12.0 - 15.0 g/dL   HCT 09.7 35.3 - 29.9 %   MCV 90.4 78.0 - 100.0 fl   MCHC 34.2 30.0 - 36.0 g/dL   RDW 24.2 68.3 - 41.9 %  Comprehensive metabolic panel  Result Value Ref Range   Sodium 139 135 - 145 mEq/L   Potassium 4.5 3.5 - 5.1 mEq/L   Chloride 102 96 - 112 mEq/L   CO2 28 19 - 32 mEq/L   Glucose, Bld 98 70 - 99 mg/dL   BUN 17 6 - 23 mg/dL   Creatinine, Ser 6.22 0.40 - 1.20 mg/dL   Total Bilirubin 0.5 0.2 - 1.2 mg/dL   Alkaline Phosphatase 104 39 - 117 U/L   AST 25 0 - 37 U/L   ALT 32 0 - 35 U/L   Total Protein 7.2 6.0 - 8.3 g/dL   Albumin 3.9 3.5 - 5.2 g/dL   GFR 297.98 >92.11 mL/min   Calcium 9.3 8.4 - 10.5 mg/dL  Hemoglobin H4R  Result Value Ref Range   Hgb A1c MFr Bld 5.5 4.6 - 6.5 %  Lipid panel  Result Value Ref Range   Cholesterol 212 (H) 0 - 200 mg/dL   Triglycerides 74.0 0.0 - 149.0 mg/dL   HDL 81.44 >81.85 mg/dL   VLDL 9.2 0.0 - 63.1 mg/dL   LDL Cholesterol 497 (H) 0 - 99 mg/dL   Total CHOL/HDL Ratio 3    NonHDL 131.71   TSH  Result Value Ref Range   TSH 1.14 0.35 - 4.50 uIU/mL   Blood counts are normal Metabolic profile normal A1c in normal range, no sign of diabetes Cholesterol overall favorable-however even with these good numbers, your estimated 10-year risk of cardiovascular disease is elevated-see calculation below.  We might consider adding a cholesterol medication to  reduce this risk-please let me know your thoughts Thyroid normal  The 10-year ASCVD risk score Denman George DC Jr., et al., 2013) is: 13.7%   Values used to calculate the score:     Age: 22 years     Sex: Female     Is Non-Hispanic African American: No     Diabetic: No     Tobacco smoker: No     Systolic Blood Pressure: 138 mmHg     Is BP treated: Yes     HDL Cholesterol: 80.6 mg/dL     Total Cholesterol: 212 mg/dL  Take care, let me know what you think about cholesterol medication  Let us plan to visit in 6 to 12 months

## 2019-10-08 ENCOUNTER — Ambulatory Visit (INDEPENDENT_AMBULATORY_CARE_PROVIDER_SITE_OTHER): Payer: Medicare Other | Admitting: Family Medicine

## 2019-10-08 ENCOUNTER — Encounter: Payer: Self-pay | Admitting: Family Medicine

## 2019-10-08 VITALS — BP 138/80 | HR 65 | Temp 96.6°F | Resp 17 | Ht 65.25 in | Wt 186.0 lb

## 2019-10-08 DIAGNOSIS — Z131 Encounter for screening for diabetes mellitus: Secondary | ICD-10-CM | POA: Diagnosis not present

## 2019-10-08 DIAGNOSIS — Z1329 Encounter for screening for other suspected endocrine disorder: Secondary | ICD-10-CM | POA: Diagnosis not present

## 2019-10-08 DIAGNOSIS — Z130 Encounter for screening for diseases of the blood and blood-forming organs and certain disorders involving the immune mechanism: Secondary | ICD-10-CM

## 2019-10-08 DIAGNOSIS — Z0000 Encounter for general adult medical examination without abnormal findings: Secondary | ICD-10-CM

## 2019-10-08 DIAGNOSIS — N952 Postmenopausal atrophic vaginitis: Secondary | ICD-10-CM

## 2019-10-08 DIAGNOSIS — E782 Mixed hyperlipidemia: Secondary | ICD-10-CM

## 2019-10-08 DIAGNOSIS — Z23 Encounter for immunization: Secondary | ICD-10-CM

## 2019-10-08 DIAGNOSIS — Z1231 Encounter for screening mammogram for malignant neoplasm of breast: Secondary | ICD-10-CM

## 2019-10-08 DIAGNOSIS — E2839 Other primary ovarian failure: Secondary | ICD-10-CM

## 2019-10-08 DIAGNOSIS — I10 Essential (primary) hypertension: Secondary | ICD-10-CM

## 2019-10-08 LAB — LIPID PANEL
Cholesterol: 212 mg/dL — ABNORMAL HIGH (ref 0–200)
HDL: 80.6 mg/dL (ref >39.00)
LDL Cholesterol: 123 mg/dL — ABNORMAL HIGH (ref 0–99)
NonHDL: 131.71
Total CHOL/HDL Ratio: 3
Triglycerides: 46 mg/dL (ref 0.0–149.0)
VLDL: 9.2 mg/dL (ref 0.0–40.0)

## 2019-10-08 LAB — COMPREHENSIVE METABOLIC PANEL
ALT: 32 U/L (ref 0–35)
AST: 25 U/L (ref 0–37)
Albumin: 3.9 g/dL (ref 3.5–5.2)
Alkaline Phosphatase: 104 U/L (ref 39–117)
BUN: 17 mg/dL (ref 6–23)
CO2: 28 mEq/L (ref 19–32)
Calcium: 9.3 mg/dL (ref 8.4–10.5)
Chloride: 102 mEq/L (ref 96–112)
Creatinine, Ser: 0.58 mg/dL (ref 0.40–1.20)
GFR: 102.74 mL/min (ref >60.00)
Glucose, Bld: 98 mg/dL (ref 70–99)
Potassium: 4.5 mEq/L (ref 3.5–5.1)
Sodium: 139 mEq/L (ref 135–145)
Total Bilirubin: 0.5 mg/dL (ref 0.2–1.2)
Total Protein: 7.2 g/dL (ref 6.0–8.3)

## 2019-10-08 LAB — CBC
HCT: 41.7 % (ref 36.0–46.0)
Hemoglobin: 14.2 g/dL (ref 12.0–15.0)
MCHC: 34.2 g/dL (ref 30.0–36.0)
MCV: 90.4 fl (ref 78.0–100.0)
Platelets: 272 10*3/uL (ref 150.0–400.0)
RBC: 4.61 Mil/uL (ref 3.87–5.11)
RDW: 13.4 % (ref 11.5–15.5)
WBC: 6 10*3/uL (ref 4.0–10.5)

## 2019-10-08 LAB — TSH: TSH: 1.14 u[IU]/mL (ref 0.35–4.50)

## 2019-10-08 LAB — HEMOGLOBIN A1C: Hgb A1c MFr Bld: 5.5 % (ref 4.6–6.5)

## 2019-10-09 ENCOUNTER — Encounter: Payer: Self-pay | Admitting: Family Medicine

## 2019-10-09 DIAGNOSIS — I10 Essential (primary) hypertension: Secondary | ICD-10-CM

## 2019-10-09 DIAGNOSIS — Z0000 Encounter for general adult medical examination without abnormal findings: Secondary | ICD-10-CM

## 2019-10-11 MED ORDER — AMLODIPINE BESYLATE 5 MG PO TABS: 5.0000 mg | ORAL_TABLET | Freq: Every day | ORAL | 3 refills | Status: DC

## 2019-10-11 MED ORDER — LISINOPRIL-HYDROCHLOROTHIAZIDE 20-12.5 MG PO TABS: 2.0000 | ORAL_TABLET | Freq: Every day | ORAL | 3 refills | Status: DC

## 2019-10-12 MED ORDER — DICLOFENAC POTASSIUM 50 MG PO TABS: ORAL_TABLET | ORAL | 1 refills | Status: DC

## 2019-10-12 NOTE — Addendum Note (Signed)
Addended by: Lamar Blinks C on: 10/12/2019 07:00 AM   Modules accepted: Orders

## 2019-10-24 ENCOUNTER — Encounter: Payer: Self-pay | Admitting: Family Medicine

## 2019-10-24 ENCOUNTER — Ambulatory Visit (HOSPITAL_BASED_OUTPATIENT_CLINIC_OR_DEPARTMENT_OTHER)
Admission: RE | Admit: 2019-10-24 | Discharge: 2019-10-24 | Disposition: A | Discharge status: Home / Self Care | Payer: Medicare Other | Source: Ambulatory Visit | Attending: Family Medicine | Admitting: Family Medicine

## 2019-10-24 ENCOUNTER — Other Ambulatory Visit: Payer: Self-pay

## 2019-10-24 DIAGNOSIS — E2839 Other primary ovarian failure: Secondary | ICD-10-CM | POA: Insufficient documentation

## 2019-10-24 DIAGNOSIS — M85852 Other specified disorders of bone density and structure, left thigh: Secondary | ICD-10-CM | POA: Diagnosis not present

## 2019-10-24 DIAGNOSIS — M8580 Other specified disorders of bone density and structure, unspecified site: Secondary | ICD-10-CM | POA: Insufficient documentation

## 2019-10-24 DIAGNOSIS — Z1231 Encounter for screening mammogram for malignant neoplasm of breast: Secondary | ICD-10-CM | POA: Diagnosis not present

## 2019-10-24 DIAGNOSIS — Z780 Asymptomatic menopausal state: Secondary | ICD-10-CM | POA: Diagnosis not present

## 2019-12-15 ENCOUNTER — Other Ambulatory Visit: Payer: Medicare Other

## 2020-01-15 ENCOUNTER — Encounter: Payer: Self-pay | Admitting: Family Medicine

## 2020-01-19 ENCOUNTER — Encounter: Payer: Self-pay | Admitting: *Deleted

## 2020-01-19 ENCOUNTER — Ambulatory Visit (INDEPENDENT_AMBULATORY_CARE_PROVIDER_SITE_OTHER): Payer: Medicare Other | Admitting: *Deleted

## 2020-01-19 ENCOUNTER — Other Ambulatory Visit: Payer: Self-pay

## 2020-01-19 DIAGNOSIS — Z0000 Encounter for general adult medical examination without abnormal findings: Secondary | ICD-10-CM

## 2020-01-19 NOTE — Progress Notes (Signed)
Nurse connected with patient 01/19/20 at 11:45 AM EST by a telephone enabled telemedicine application and verified that I am speaking with the correct person using two identifiers. Patient stated full name and DOB. Patient gave permission to continue with virtual visit. Patient's location was at home and Nurse's location was at [place] office.   Subjective:   Jeanette Mckee is a 69 y.o. female who presents for Medicare Annual (Subsequent) preventive examination.   Works out at [employer] 3x/wk.  Review of Systems:    Home Safety/Smoke Alarms: Feels safe in home. Smoke alarms in place.  Lives w/ husband and dog.   Female:   Mammo- 10/24/19       Dexa scan- 10/24/19 [facility]- Cologuard-07/14/19-neg    Objective:     Vitals: Unable to assess. This visit is enabled though telemedicine due to Covid 19.   Advanced Directives 01/19/2020 09/12/2017  Does Patient Have a Medical Advance Directive? No Yes  Type of Advance Directive - Healthcare Power of [place];Living will  Copy of Healthcare Power of Attorney in Chart? - No - copy requested  Would patient like information on creating a medical advance directive? No - Patient declined -    Tobacco Social History   Tobacco Use  Smoking Status Former Smoker  . Years: 20.00  . Quit date: 11/13/2001  . Years since quitting: 18.1  Smokeless Tobacco Never Used  Tobacco Comment   quit in 2003 1 ppd for 20 years     Counseling given: Not Answered Comment: quit in 2003 1 ppd for 20 years   Clinical Intake:   Pain : No/denies pain     Past Medical History:  Diagnosis Date  . History of chicken pox    childhood  . Hypertension   . Measles as a child  . Other and unspecified hyperlipidemia 02/11/2013  . Preventative health care 02/11/2013   Past Surgical History:  Procedure Laterality Date  . ABDOMINAL HYSTERECTOMY  12/1994   and RSO & LS   Family History  Problem Relation Age of Onset  . Alcohol abuse Father   . Heart  disease Father   . Stroke Father   . Hypertension Father   . Diabetes Father        type 2  . Cancer Father        prostate  . Heart disease Mother   . Hypertension Mother   . Arthritis Mother   . Heart attack Mother 60  . Crohn's disease Mother   . Cancer Maternal Grandmother        bone- nonsmoker  . Heart disease Son        MI s/p 3 stents, January 2015  . Breast cancer Neg Hx   . Colon cancer Neg Hx    Social History   Socioeconomic History  . Marital status: Married    Spouse name: Not on file  . Number of children: Not on file  . Years of education: Not on file  . Highest education level: Not on file  Occupational History  . Not on file  Tobacco Use  . Smoking status: Former Smoker    Years: 20.00    Quit date: 11/13/2001    Years since quitting: 18.1  . Smokeless tobacco: Never Used  . Tobacco comment: quit in 2003 1 ppd for 20 years  Substance and Sexual Activity  . Alcohol use: Yes    Comment: WINE FRIDAY AND SATURDAY  . Drug use: No  . Sexual  activity: Yes    Comment: lives with husband, retired from church, [place]. eats a heart healthy diet, minimal meats.exercises 6 days a week  Other Topics Concern  . Not on file  Social History Narrative  . Not on file   Social Determinants of Health   Financial Resource Strain:   . Difficulty of Paying Living Expenses: Not on file  Food Insecurity:   . Worried About Charity fundraiser in the Last Year: Not on file  . Ran Out of Food in the Last Year: Not on file  Transportation Needs:   . Lack of Transportation (Medical): Not on file  . Lack of Transportation (Non-Medical): Not on file  Physical Activity:   . Days of Exercise per Week: Not on file  . Minutes of Exercise per Session: Not on file  Stress:   . Feeling of Stress : Not on file  Social Connections:   . Frequency of Communication with Friends and Family: Not on file  . Frequency of Social Gatherings with Friends and Family: Not on file  . Attends  Religious Services: Not on file  . Active Member of Clubs or Organizations: Not on file  . Attends Archivist Meetings: Not on file  . Marital Status: Not on file    Outpatient Encounter Medications as of 01/19/2020  Medication Sig  . amLODipine (NORVASC) 5 MG tablet Take 1 tablet (5 mg total) by mouth daily.  . diclofenac (CATAFLAM) 50 MG tablet TAKE 1 TABLET BY MOUTH  TWICE DAILY AS NEEDED  . estradiol (ESTRACE) 0.1 MG/GM vaginal cream APPLY 1 TO 2 GRAMS  VAGINALLY UP TO 3 TIMES  WEEKLY AS NEEDED  . lisinopril-hydrochlorothiazide (ZESTORETIC) 20-12.5 MG tablet Take 2 tablets by mouth daily.  . Multiple Vitamin (MULTIVITAMIN) tablet Take 1 tablet by mouth daily.   No facility-administered encounter medications on file as of 01/19/2020.    Activities of Daily Living In your present state of health, do you have any difficulty performing the following activities: 01/19/2020  Hearing? N  Vision? N  Difficulty concentrating or making decisions? N  Walking or climbing stairs? N  Dressing or bathing? N  Doing errands, shopping? N  Preparing Food and eating ? N  Using the Toilet? N  In the past six months, have you accidently leaked urine? N  Do you have problems with loss of bowel control? N  Managing your Medications? N  Managing your Finances? N  Housekeeping or managing your Housekeeping? N  Some recent data might be hidden    Patient Care Team: Copland, Gay Filler, MD as PCP - General (Family Medicine) Juanita Craver, MD as Consulting Physician (Gastroenterology)    Assessment:   This is a routine wellness examination for [place]. Physical assessment deferred to PCP.  Exercise Activities and Dietary recommendations Current Exercise Habits: Structured exercise class, Type of exercise: walking;treadmill;strength training/weights;stretching, Time (Minutes): 40, Frequency (Times/Week): 3, Weekly Exercise (Minutes/Week): 120, Intensity: Mild, Exercise limited by: None identified    Diet (meal preparation, eat out, water intake, caffeinated beverages, dairy products, fruits and vegetables): well balanced      Goals    . Reduce other obligations and focus on family (pt-stated)       Fall Risk Fall Risk  01/19/2020 09/12/2017 04/26/2016  Falls in the past year? 0 No No  Number falls in past yr: 0 - -  Injury with Fall? 0 - -  Follow up Education provided;Falls prevention discussed - -    Depression  Screen PHQ 2/9 Scores 01/19/2020 09/12/2017 04/26/2016  PHQ - 2 Score 0 0 0     Cognitive Function Ad8 score reviewed for issues:  Issues making decisions:no  Less interest in hobbies / activities:no  Repeats questions, stories (family complaining):no  Trouble using ordinary gadgets (microwave, computer, phone):no  Forgets the month or year: no  Mismanaging finances: no  Remembering appts:no  Daily problems with thinking and/or memory:no Ad8 score is=0   MMSE - Mini Mental State Exam 09/12/2017  Orientation to time 5  Orientation to Place 5  Registration 3  Attention/ Calculation 5  Recall 3  Language- name 2 objects 2  Language- repeat 1  Language- follow 3 step command 3  Language- read & follow direction 1  Write a sentence 1  Copy design 1  Total score 30        Immunization History  Administered Date(s) Administered  . Fluad Quad(high Dose 65+) 10/08/2019  . Influenza, High Dose Seasonal PF 07/20/2016, 09/05/2017, 10/02/2018  . Pneumococcal Conjugate-13 02/03/2016  . Pneumococcal Polysaccharide-23 09/05/2017  . Tdap 10/02/2018    Screening Tests Health Maintenance  Topic Date Due  . MAMMOGRAM  10/23/2021  . Fecal DNA (Cologuard)  07/13/2022  . TETANUS/TDAP  10/02/2028  . INFLUENZA VACCINE  Completed  . DEXA SCAN  Completed  . PNA vac Low Risk Adult  Completed  . Hepatitis C Screening  Addressed       Plan:    Please schedule your next medicare wellness visit with me in 1 yr.  Continue to eat heart healthy diet (full  of fruits, vegetables, whole grains, lean protein, water--limit salt, fat, and sugar intake) and increase physical activity as tolerated.  Continue doing brain stimulating activities (puzzles, reading, adult coloring books, staying active) to keep memory sharp.     I have personally reviewed and noted the following in the patient's chart:   . Medical and social history . Use of alcohol, tobacco or illicit drugs  . Current medications and supplements . Functional ability and status . Nutritional status . Physical activity . Advanced directives . List of other physicians . Hospitalizations, surgeries, and ER visits in previous 12 months . Vitals . Screenings to include cognitive, depression, and falls . Referrals and appointments  In addition, I have reviewed and discussed with patient certain preventive protocols, quality metrics, and best practice recommendations. A written personalized care plan for preventive services as well as general preventive health recommendations were provided to patient.     [place]on Gully, [place]  01/19/2020

## 2020-01-19 NOTE — Patient Instructions (Signed)
Please schedule your next medicare wellness visit with me in 1 yr.  Continue to eat heart healthy diet (full of fruits, vegetables, whole grains, lean protein, water--limit salt, fat, and sugar intake) and increase physical activity as tolerated.  Continue doing brain stimulating activities (puzzles, reading, adult coloring books, staying active) to keep memory sharp.    Jeanette Mckee , Thank you for taking time to come for your Medicare Wellness Visit. I appreciate your ongoing commitment to your health goals. Please review the following plan we discussed and let me know if I can assist you in the future.   These are the goals we discussed: Goals    . Reduce other obligations and focus on family (pt-stated)       This is a list of the screening recommended for you and due dates:  Health Maintenance  Topic Date Due  . Mammogram  10/23/2021  . Cologuard (Stool DNA test)  07/13/2022  . Tetanus Vaccine  10/02/2028  . Flu Shot  Completed  . DEXA scan (bone density measurement)  Completed  . Pneumonia vaccines  Completed  .  Hepatitis C: One time screening is recommended by Center for Disease Control  (CDC) for  adults born from [birth year] through 1967.   Addressed    Preventive Care 90 Years and Older, Female Preventive care refers to lifestyle choices and visits with your health care provider that can promote health and wellness. This includes:  A yearly physical exam. This is also called an annual well check.  Regular dental and eye exams.  Immunizations.  Screening for certain conditions.  Healthy lifestyle choices, such as diet and exercise. What can I expect for my preventive care visit? Physical exam Your health care provider will check:  Height and weight. These may be used to calculate body mass index (BMI), which is a measurement that tells if you are at a healthy weight.  Heart rate and blood pressure.  Your skin for abnormal spots. Counseling Your health care provider  may ask you questions about:  Alcohol, tobacco, and drug use.  Emotional well-being.  Home and relationship well-being.  Sexual activity.  Eating habits.  History of falls.  Memory and ability to understand (cognition).  Work and work Statistician.  Pregnancy and menstrual history. What immunizations do I need?  Influenza (flu) vaccine  This is recommended every year. Tetanus, diphtheria, and pertussis (Tdap) vaccine  You may need a Td booster every 10 years. Varicella (chickenpox) vaccine  You may need this vaccine if you have not already been vaccinated. Zoster (shingles) vaccine  You may need this after age 43. Pneumococcal conjugate (PCV13) vaccine  One dose is recommended after age 65. Pneumococcal polysaccharide (PPSV23) vaccine  One dose is recommended after age 2. Measles, mumps, and rubella (MMR) vaccine  You may need at least one dose of MMR if you were born in 1957 or later. You may also need a second dose. Meningococcal conjugate (MenACWY) vaccine  You may need this if you have certain conditions. Hepatitis A vaccine  You may need this if you have certain conditions or if you travel or work in places where you may be exposed to hepatitis A. Hepatitis B vaccine  You may need this if you have certain conditions or if you travel or work in places where you may be exposed to hepatitis B. Haemophilus influenzae type b (Hib) vaccine  You may need this if you have certain conditions. You may receive vaccines as individual doses or  as more than one vaccine together in one shot (combination vaccines). Talk with your health care provider about the risks and benefits of combination vaccines. What tests do I need? Blood tests  Lipid and cholesterol levels. These may be checked every 5 years, or more frequently depending on your overall health.  Hepatitis C test.  Hepatitis B test. Screening  Lung cancer screening. You may have this screening every year  starting at age 64 if you have a 30-pack-year history of smoking and currently smoke or have quit within the past 15 years.  Colorectal cancer screening. All adults should have this screening starting at age 52 and continuing until age 31. Your health care provider may recommend screening at age 87 if you are at increased risk. You will have tests every 1-10 years, depending on your results and the type of screening test.  Diabetes screening. This is done by checking your blood sugar (glucose) after you have not eaten for a while (fasting). You may have this done every 1-3 years.  Mammogram. This may be done every 1-2 years. Talk with your health care provider about how often you should have regular mammograms.  BRCA-related cancer screening. This may be done if you have a family history of breast, ovarian, tubal, or peritoneal cancers. Other tests  Sexually transmitted disease (STD) testing.  Bone density scan. This is done to screen for osteoporosis. You may have this done starting at age 31. Follow these instructions at home: Eating and drinking  Eat a diet that includes fresh fruits and vegetables, whole grains, lean protein, and low-fat dairy products. Limit your intake of foods with high amounts of sugar, saturated fats, and salt.  Take vitamin and mineral supplements as recommended by your health care provider.  Do not drink alcohol if your health care provider tells you not to drink.  If you drink alcohol: ? Limit how much you have to 0-1 drink a day. ? Be aware of how much alcohol is in your drink. In the [place] one drink equals one 12 oz bottle of beer (355 mL), one 5 oz glass of wine (148 mL), or one 1 oz glass of hard liquor (44 mL). Lifestyle  Take daily care of your teeth and gums.  Stay active. Exercise for at least 30 minutes on 5 or more days each week.  Do not use any products that contain nicotine or tobacco, such as cigarettes, e-cigarettes, and chewing tobacco. If  you need help quitting, ask your health care provider.  If you are sexually active, practice safe sex. Use a condom or other form of protection in order to prevent STIs (sexually transmitted infections).  Talk with your health care provider about taking a low-dose aspirin or statin. What's next?  Go to your health care provider once a year for a well check visit.  Ask your health care provider how often you should have your eyes and teeth checked.  Stay up to date on all vaccines. This information is not intended to replace advice given to you by your health care provider. Make sure you discuss any questions you have with your health care provider. Document Revised: 10/24/2018 Document Reviewed: 10/24/2018 Elsevier Patient Education  2020 [employer].

## 2020-01-21 ENCOUNTER — Other Ambulatory Visit: Payer: Self-pay

## 2020-01-21 NOTE — Progress Notes (Signed)
[facility] [address], [address] [place], [place] [address] [phone]  Date:  01/22/2020   Name:  Jeanette Mckee   DOB:  04-24-1951   MRN:  [id number]  PCP:  Pearline Cables, MD    Chief Complaint: Ear Pain (right ear, troubel hearing, discomfort, popping)   History of Present Illness:  Jeanette Mckee is a 69 y.o. very pleasant female patient who presents with the following:  Patient with history of hypertension, here today with a new concern. 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 Osteopenia 10/24/2019  . Cold sore 04/19/2014  . 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 Other and unspecified hyperlipidemia 02/11/2013  . HTN (hypertension) 02/04/2012  . Abnormal liver enzymes 02/04/2012  . Chronic back pain 02/04/2012    Past Medical History:  Diagnosis Date  . History of chicken pox    childhood  . Hypertension   . Measles as a child  . Other and unspecified hyperlipidemia 02/11/2013  . Preventative health care 02/11/2013    Past Surgical History:  Procedure Laterality Date  . ABDOMINAL HYSTERECTOMY  12/1994   and RSO & LS    Social History   Tobacco Use  . Smoking status: Former Smoker    Years: 20.00    Quit date: 11/13/2001    Years since quitting: 18.2  . Smokeless tobacco: Never  Used  . Tobacco comment: quit in 2003 1 ppd for 20 years  Substance Use Topics  . Alcohol use: Yes    Comment: WINE FRIDAY AND SATURDAY  . Drug use: No    Family History  Problem Relation Age of Onset  . Alcohol abuse Father   . Heart disease Father   . Stroke Father   . Hypertension Father   . Diabetes Father        type 2  . Cancer Father        prostate  . Heart disease Mother   . Hypertension Mother   . Arthritis Mother   . Heart attack Mother 21  . Crohn's disease Mother   . Cancer Maternal Grandmother        bone- nonsmoker  . Heart disease Son        MI s/p 3 stents, January 2015  . Breast cancer Neg Hx   . Colon cancer Neg Hx     No Known Allergies  Medication list has been reviewed and updated.  Current Outpatient Medications on File Prior to Visit  Medication Sig Dispense Refill  . amLODipine (NORVASC) 5 MG tablet Take 1 tablet (5 mg total) by mouth daily. 90 tablet 3  . diclofenac (CATAFLAM) 50 MG tablet TAKE 1 TABLET BY MOUTH  TWICE DAILY AS NEEDED 180 tablet 1  . estradiol (ESTRACE) 0.1 MG/GM vaginal cream  APPLY 1 TO 2 GRAMS  VAGINALLY UP TO 3 TIMES  WEEKLY AS NEEDED 85 g 3  . lisinopril-hydrochlorothiazide (ZESTORETIC) 20-12.5 MG tablet Take 2 tablets by mouth daily. 180 tablet 3  . Multiple Vitamin (MULTIVITAMIN) tablet Take 1 tablet by mouth daily.     No current facility-administered medications on file prior to visit.    Review of Systems:  As per HPI- otherwise negative.   Physical Examination: Vitals:   01/22/20 0934 01/22/20 0949  BP: (!) 173/90 140/90  Pulse: 73   Resp: 18   Temp: (!) 96.6 F (35.9 C)   SpO2: 95%    Vitals:   01/22/20 0934  Weight: 185 lb (83.9 kg)  Height: 5\' 5"  (1.651 m)   Body mass index is 30.79 kg/m. Ideal Body Weight: Weight in (lb) to have BMI = 25: 149.9  GEN: no acute distress.  Overweight, looks well  HEENT: Atraumatic, Normocephalic.   oropharynx normal.  PEERL,EOMI.   Ears and Nose: No external  deformity. CV: RRR, No M/G/R. No JVD. No thrill. No extra heart sounds. PULM: CTA B, no wheezes, crackles, rhonchi. No retractions. No resp. distress. No accessory muscle use. ABD: S, NT, ND, +BS. No rebound. No HSM. EXTR: No c/c/e PSYCH: Normally interactive. Conversant.  Found to have right sided cerumen impaction- resolved with irrigation Bilateral TM and IAC then normal Pt felt like her sx were resolved    Assessment and Plan: Impacted cerumen of right ear  Resolved with irrigation She will contact me if any other concerns prior to her upcoming plane flights  This visit occurred during the SARS-CoV-2 public health emergency.  Safety protocols were in place, including screening questions prior to the visit, additional usage of staff PPE, and extensive cleaning of exam room while observing appropriate contact time as indicated for disinfecting solutions.    Signed Lamar Blinks, MD

## 2020-01-21 NOTE — Patient Instructions (Signed)
It was great to see you again today as always

## 2020-01-22 ENCOUNTER — Ambulatory Visit (INDEPENDENT_AMBULATORY_CARE_PROVIDER_SITE_OTHER): Payer: Medicare Other | Admitting: Family Medicine

## 2020-01-22 ENCOUNTER — Other Ambulatory Visit: Payer: Self-pay

## 2020-01-22 ENCOUNTER — Encounter: Payer: Self-pay | Admitting: Family Medicine

## 2020-01-22 VITALS — BP 140/90 | HR 73 | Temp 96.6°F | Resp 18 | Ht 65.0 in | Wt 185.0 lb

## 2020-01-22 DIAGNOSIS — H6121 Impacted cerumen, right ear: Secondary | ICD-10-CM | POA: Diagnosis not present

## 2020-02-04 ENCOUNTER — Other Ambulatory Visit: Payer: Self-pay | Admitting: Family Medicine

## 2020-02-04 DIAGNOSIS — Z0000 Encounter for general adult medical examination without abnormal findings: Secondary | ICD-10-CM

## 2020-03-15 ENCOUNTER — Encounter: Payer: Self-pay | Admitting: Family Medicine

## 2020-04-06 DIAGNOSIS — M545 Low back pain: Secondary | ICD-10-CM | POA: Diagnosis not present

## 2020-04-06 DIAGNOSIS — M47816 Spondylosis without myelopathy or radiculopathy, lumbar region: Secondary | ICD-10-CM | POA: Diagnosis not present

## 2020-04-27 ENCOUNTER — Ambulatory Visit: Payer: Medicare Other | Attending: Physical Medicine and Rehabilitation | Admitting: Physical Therapy

## 2020-04-27 ENCOUNTER — Other Ambulatory Visit: Payer: Self-pay

## 2020-04-27 ENCOUNTER — Encounter: Payer: Self-pay | Admitting: Physical Therapy

## 2020-04-27 DIAGNOSIS — M545 Low back pain, unspecified: Secondary | ICD-10-CM

## 2020-04-27 DIAGNOSIS — G8929 Other chronic pain: Secondary | ICD-10-CM | POA: Insufficient documentation

## 2020-04-27 DIAGNOSIS — R262 Difficulty in walking, not elsewhere classified: Secondary | ICD-10-CM | POA: Diagnosis not present

## 2020-04-27 DIAGNOSIS — R293 Abnormal posture: Secondary | ICD-10-CM | POA: Insufficient documentation

## 2020-04-27 NOTE — Therapy (Signed)
[facility] [address]  [address] [place], [place], [address] Phone: [phone]   Fax:  [phone]  Physical Therapy Evaluation  Patient Details  Name: Jeanette Mckee MRN: [id number] Date of Birth: [birth year] Referring Provider (PT): Romero Belling, MD   Encounter Date: 04/27/2020   PT End of Session - 04/27/20 1056    Visit Number 1    Number of Visits 7    Date for PT Re-Evaluation 06/08/20    Authorization Type UHC Medicare    PT Start Time 0759    PT Stop Time 0840    PT Time Calculation (min) 41 min    Activity Tolerance Patient tolerated treatment well    Behavior During Therapy [facility] for tasks assessed/performed           Past Medical History:  Diagnosis Date  . History of chicken pox    childhood  . Hypertension   . Measles as a child  . Other and unspecified hyperlipidemia 02/11/2013  . Preventative health care 02/11/2013    Past Surgical History:  Procedure Laterality Date  . ABDOMINAL HYSTERECTOMY  12/1994   and RSO & LS    There were no vitals filed for this visit.    Subjective Assessment - 04/27/20 0801    Subjective Patient reporting LBP first started in 2007, with most recent episode of pain beginning in March 2021 after travelling on a plane. Pain is located over the central LB, sometimes radiating to the L buttock. Pain aggravated by walking, prolonged standing such as when cooking or washing dishes, heavy lifting. Better laying supine with wedge under knees or laying supine with LEs elevated on wall. Denies radiation down LES. Notes new onset of N/T in B LEs from both knees down to toes for the past 6 months- this occurs when bending forward while standing and walking up an incline. Denies changes in B&B control besides stress incontinence with coughing.    Pertinent History HLD, HTN    Limitations Walking;Standing;House hold activities;Lifting    How long can you sit comfortably? unlimited     How long can you stand comfortably? 10-15 min    How long can you walk comfortably? 5 min    Diagnostic tests none recent    Patient Stated Goals "get to walk on the beach"    Currently in Pain? Yes    Pain Score 2     Pain Location Back    Pain Orientation Lower    Pain Descriptors / Indicators Aching    Pain Type Chronic pain              [facility] PT Assessment - 04/27/20 0811      Assessment   Medical Diagnosis Axial LBP    Referring Provider (PT) Romero Belling, MD    Onset Date/Surgical Date --   chronic since 2007   Next MD Visit not scheduled    Prior Therapy no      Precautions   Precautions None      Balance Screen   Has the patient fallen in the past 6 months No    Has the patient had a decrease in activity level because of a fear of falling?  No    Is the patient reluctant to leave their home because of a fear of falling?  No      Home [employer]ist information centre manager residence    Living Arrangements Spouse/significant other  Available Help at Discharge Family    Type of Home House    Home Access Stairs to enter    Entrance Stairs-Number of Steps 3    Entrance Stairs-Rails None    Home Layout One level    Home Equipment None      Prior Function   Level of Independence Independent    Vocation Retired    Leisure walking, working out      Copy Status Within Functional Limits for tasks assessed      Sensation   Light Touch Appears Intact      Coordination   Gross Motor Movements are Fluid and Coordinated Yes      Posture/Postural Control   Posture/Postural Control Postural limitations    Postural Limitations Rounded Shoulders;Forward head      ROM / Strength   AROM / PROM / Strength AROM;Strength      AROM   AROM Assessment Site Lumbar    Lumbar Flexion toes    Lumbar Extension WFL   mild discomfort   Lumbar - Right Side Bend distal shin   "stiffness"   Lumbar - Left Side Bend distal shin   "stiffness"    Lumbar - Right Rotation WFL    Lumbar - Left Rotation [facility]      Strength   Strength Assessment Site Hip;Knee;Ankle    Right/Left Hip Right;Left    Right Hip Flexion 5/5    Right Hip ABduction 4+/5    Right Hip ADduction 4+/5    Left Hip Flexion 4+/5    Left Hip ABduction 4+/5    Left Hip ADduction 4+/5    Right/Left Knee Right;Left    Right Knee Flexion 4+/5    Right Knee Extension 5/5    Left Knee Flexion 4+/5    Left Knee Extension 5/5    Right/Left Ankle Right;Left    Right Ankle Dorsiflexion 5/5    Right Ankle Plantar Flexion 4+/5    Left Ankle Dorsiflexion 5/5    Left Ankle Plantar Flexion 4+/5      Flexibility   Soft Tissue Assessment /Muscle Length yes    Hamstrings B WFL    Piriformis B WFL; mild pain in L buttock with L KTOS stretch      Palpation   Spinal mobility hypomobility throughout lower thoracic and lumbar segments with central PAs- no TTP    SI assessment  R PSIS and ASIS depressed    Palpation comment TTP over B PSIS and pain with pressure over sacrum; no TTP in glutes      Ambulation/Gait   Assistive device None    Gait Pattern Step-through pattern;Decreased weight shift to left;Lateral hip instability   L hip drop   Ambulation Surface Level;Indoor    Gait velocity WFL                      Objective measurements completed on examination: See above findings.               PT Education - 04/27/20 1055    Education Details prognosis, POC, HEP    Person(s) Educated Patient    Methods Explanation;Demonstration;Tactile cues;Verbal cues;Handout    Comprehension Verbalized understanding;Returned demonstration            PT Short Term Goals - 04/27/20 1207      PT SHORT TERM GOAL #1   Title Patient to be independent with initial HEP.    Time 3  Period Weeks    Status New    Target Date 05/18/20             PT Long Term Goals - 04/27/20 1207      PT LONG TERM GOAL #1   Title Patient to be independent with  advanced HEP.    Time 6    Period Weeks    Status New    Target Date 06/08/20      PT LONG TERM GOAL #2   Title Patient to demonstrate lumbar AROM WFL and without c/o discomfort.    Time 6    Period Weeks    Status New    Target Date 06/08/20      PT LONG TERM GOAL #3   Title Patient to demonstrate symmetrical SI alignment for >1 week.    Time 6    Period Weeks    Status New    Target Date 06/08/20      PT LONG TERM GOAL #4   Title Patient to report 60% improvement in tolerance for walking uphill.    Time 6    Period Weeks    Status New    Target Date 06/08/20      PT LONG TERM GOAL #5   Title Patient to report tolerance for 30 minutes of walking without pain limiting.    Time 6    Period Weeks    Status New    Target Date 06/08/20                  Plan - 04/27/20 1057    Clinical Impression Statement Patient is a 68y/o F presenting to OPPT with c/o acute on chronic midline LBP of 3 months duration after a long trip travelling on a plane. Pain intermittently radiates to the L buttock, but not down the LEs. Does note N/T down LEs when bending forward while standing and walking up an incline. Denies changes in B&B control. Pain worse with walking, prolonged standing such as when cooking or washing dishes, and heavy lifting. Patient also notes that she believes her L LE is longer than her R. Patient today presenting with forward head posture and rounded shoulders, c/o discomfort and stiffness with lumbar AROM, depressed R PSIS and ASIS, hypomobility throughout lower thoracic and lumbar segments with central PAs, TTP over B PSIS and sacrum, and gait deviations. SI alignment may indicate an upslip of the L innominate or a postural compensation d/t leg length discrepancy. Patient was educated on gentle lumbopelvic ROM HEP and reported understanding. Would benefit from skilled PT services 1x/week for 6 weeks to address aforementioned impairments.    Personal Factors and  Comorbidities Age;Sex;Comorbidity 3+;Fitness;Past/Current Experience;Time since onset of injury/illness/exacerbation    Comorbidities HLD, HTN    Examination-Activity Limitations Bend;Squat;Stairs;Carry;Stand;Transfers;Lift;Locomotion Level    Examination-Participation Restrictions Church;Cleaning;Shop;Community Activity;Yard Work;Laundry;Meal Prep    Stability/Clinical Decision Making Stable/Uncomplicated    Clinical Decision Making Low    Rehab Potential Good    PT Frequency 1x / week    PT Duration 6 weeks    PT Treatment/Interventions ADLs/Self Care Home Management;Cryotherapy;Electrical Stimulation;Moist Heat;Traction;Balance training;Therapeutic exercise;Therapeutic activities;Functional mobility training;Stair training;Gait training;Ultrasound;Neuromuscular re-education;Patient/family education;Manual techniques;Taping;Energy conservation;Dry needling;Passive range of motion    PT Next Visit Plan lumbar FOTO; assess leg length, reassess HEP, progress lumbopelvic ROM, jt mobs    Consulted and Agree with Plan of Care Patient           Patient will benefit from skilled therapeutic intervention in order to improve  the following deficits and impairments:  Abnormal gait, Decreased activity tolerance, Pain, Increased fascial restricitons, Difficulty walking, Increased muscle spasms, Improper body mechanics, Decreased range of motion, Impaired flexibility, Postural dysfunction  Visit Diagnosis: Chronic midline low back pain without sciatica  Difficulty in walking, not elsewhere classified  Abnormal posture     Problem List Patient Active Problem List   Diagnosis Date Noted  . Osteopenia 10/24/2019  . Cold sore 04/19/2014  . 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 Other and unspecified hyperlipidemia 02/11/2013  . HTN (hypertension) 02/04/2012  . Abnormal liver enzymes 02/04/2012  . Chronic back pain 02/04/2012     Janene Harvey, PT, DPT 04/27/20 12:21  PM   [facility] [address]  [place], [place], [address] Phone: [phone]   Fax:  [phone]  Name: Jeanette Mckee MRN: [id number] Date of Birth: 04-02-1951

## 2020-05-04 ENCOUNTER — Other Ambulatory Visit: Payer: Self-pay

## 2020-05-04 ENCOUNTER — Ambulatory Visit: Payer: Medicare Other

## 2020-05-04 DIAGNOSIS — R293 Abnormal posture: Secondary | ICD-10-CM | POA: Diagnosis not present

## 2020-05-04 DIAGNOSIS — R262 Difficulty in walking, not elsewhere classified: Secondary | ICD-10-CM

## 2020-05-04 DIAGNOSIS — M545 Low back pain: Secondary | ICD-10-CM | POA: Diagnosis not present

## 2020-05-04 DIAGNOSIS — G8929 Other chronic pain: Secondary | ICD-10-CM | POA: Diagnosis not present

## 2020-05-04 NOTE — Therapy (Signed)
[facility] [address]  [address] [place], [place], [address] Phone: [phone]   Fax:  [phone]  Physical Therapy Treatment  Patient Details  Name: Jeanette Mckee MRN: [id number] Date of Birth: March 12, 1951 Referring Provider (PT): Romero Belling, MD   Encounter Date: 05/04/2020   PT End of Session - 05/04/20 1032    Visit Number 2    Number of Visits 7    Date for PT Re-Evaluation 06/08/20    Authorization Type UHC Medicare    PT Start Time 0847    PT Stop Time 0931    PT Time Calculation (min) 44 min    Activity Tolerance Patient tolerated treatment well    Behavior During Therapy [facility] for tasks assessed/performed           Past Medical History:  Diagnosis Date  . History of chicken pox    childhood  . Hypertension   . Measles as a child  . Other and unspecified hyperlipidemia 02/11/2013  . Preventative health care 02/11/2013    Past Surgical History:  Procedure Laterality Date  . ABDOMINAL HYSTERECTOMY  12/1994   and RSO & LS    There were no vitals filed for this visit.   Subjective Assessment - 05/04/20 0850    Subjective pt reports her back has been feeling a little better and she was able to keep up with her granddaughter at her riding lessons, walking behind the horse. The exercises are helping and get her going in the AM. She has a little more pain today, but she has her days. During session, pt remembered she hasn't had numbness from the knees down when cooking/standing.    Pertinent History HLD, HTN    Limitations Walking;Standing;House hold activities;Lifting    How long can you sit comfortably? unlimited    How long can you stand comfortably? 10-15 min    How long can you walk comfortably? 5 min    Diagnostic tests none recent    Patient Stated Goals "get to walk on the beach"    Currently in Pain? Yes    Pain Score 2     Pain Location Back    Pain Orientation Right;Lower    Pain  Descriptors / Indicators Aching    Pain Type Chronic pain                             [facility] Adult PT Treatment/Exercise - 05/04/20 0001      Exercises   Exercises Lumbar      Lumbar Exercises: Stretches   Prone on Elbows Stretch 1 rep;60 seconds    Prone on Elbows Stretch Limitations some R sided low back tightness    Press Ups 10 reps    Press Ups Limitations inhale lift, exhale hold and relax stomach/glutes    Other Lumbar Stretch Exercise R thoracic rotation in L S/L, R LE stabilized with LUE x 15      Lumbar Exercises: Supine   Ab Set 10 reps;5 seconds    AB Set Limitations R side TrA reduced with isometric contraction    Bridge --    Bridge with [employer] 10 reps;5 seconds    Bridge with [employer] Limitations exhale and do TrA, low bridge d/t B HS cramping      Lumbar Exercises: Quadruped   Madcat/Old Horse 10 reps    Other Quadruped Lumbar Exercises Rocking x 5, tail  wag x 10                    PT Short Term Goals - 04/27/20 1207      PT SHORT TERM GOAL #1   Title Patient to be independent with initial HEP.    Time 3    Period Weeks    Status New    Target Date 05/18/20             PT Long Term Goals - 04/27/20 1207      PT LONG TERM GOAL #1   Title Patient to be independent with advanced HEP.    Time 6    Period Weeks    Status New    Target Date 06/08/20      PT LONG TERM GOAL #2   Title Patient to demonstrate lumbar AROM WFL and without c/o discomfort.    Time 6    Period Weeks    Status New    Target Date 06/08/20      PT LONG TERM GOAL #3   Title Patient to demonstrate symmetrical SI alignment for >1 week.    Time 6    Period Weeks    Status New    Target Date 06/08/20      PT LONG TERM GOAL #4   Title Patient to report 60% improvement in tolerance for walking uphill.    Time 6    Period Weeks    Status New    Target Date 06/08/20      PT LONG TERM GOAL #5   Title Patient to report tolerance for 30  minutes of walking without pain limiting.    Time 6    Period Weeks    Status New    Target Date 06/08/20                 Plan - 05/04/20 1033    Clinical Impression Statement Pt presents with significant improvement in R LBP and radicular s/s, not reporting any numbness since IE. Pt responded well to treatment today, progressing lumbar extension and adding R thoracic rotation wiht pt report of feeling better (added to HEP). Initiated TrA activation with R side firing, but weaker than L. Ball ADD during pelvic tilt/low bridge increased R TrA firing, per pt. Provided additions to HEP with TrA + ball ADD and R thoracic rotations. Pt educated to add those to program, as well as ease into cat/camel in AM by starting with gentle rocking then tail wag before full cat/camel due to report of shooting pain.    Personal Factors and Comorbidities Age;Sex;Comorbidity 3+;Fitness;Past/Current Experience;Time since onset of injury/illness/exacerbation    Comorbidities HLD, HTN    Examination-Activity Limitations Bend;Squat;Stairs;Carry;Stand;Transfers;Lift;Locomotion Level    Examination-Participation Restrictions Church;Cleaning;Shop;Community Activity;Yard Work;Laundry;Meal Prep    Stability/Clinical Decision Making Stable/Uncomplicated    Rehab Potential Good    PT Frequency 1x / week    PT Duration 6 weeks    PT Treatment/Interventions ADLs/Self Care Home Management;Cryotherapy;Electrical Stimulation;Moist Heat;Traction;Balance training;Therapeutic exercise;Therapeutic activities;Functional mobility training;Stair training;Gait training;Ultrasound;Neuromuscular re-education;Patient/family education;Manual techniques;Taping;Energy conservation;Dry needling;Passive range of motion    PT Next Visit Plan lumbar FOTO; assess leg length, reassess HEP, progress lumbopelvic ROM, jt mobs    Consulted and Agree with Plan of Care Patient           Patient will benefit from skilled therapeutic intervention  in order to improve the following deficits and impairments:  Abnormal gait, Decreased activity tolerance, Pain, Increased fascial restricitons, Difficulty  walking, Increased muscle spasms, Improper body mechanics, Decreased range of motion, Impaired flexibility, Postural dysfunction  Visit Diagnosis: Chronic midline low back pain without sciatica  Difficulty in walking, not elsewhere classified  Abnormal posture     Problem List Patient Active Problem List   Diagnosis Date Noted  . Osteopenia 10/24/2019  . Cold sore 04/19/2014  . 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 Other and unspecified hyperlipidemia 02/11/2013  . HTN (hypertension) 02/04/2012  . Abnormal liver enzymes 02/04/2012  . Chronic back pain 02/04/2012    Izell Raemon, PT, DPT 05/04/2020, 10:50 AM  [facility] [address]  [place], [place], [address] Phone: [phone]   Fax:  [phone]  Name: PEPPER KERRICK MRN: [id number] Date of Birth: [birth year]

## 2020-05-11 ENCOUNTER — Other Ambulatory Visit: Payer: Self-pay

## 2020-05-11 ENCOUNTER — Ambulatory Visit: Payer: Medicare Other | Admitting: Physical Therapy

## 2020-05-11 ENCOUNTER — Encounter: Payer: Self-pay | Admitting: Physical Therapy

## 2020-05-11 DIAGNOSIS — G8929 Other chronic pain: Secondary | ICD-10-CM

## 2020-05-11 DIAGNOSIS — R293 Abnormal posture: Secondary | ICD-10-CM | POA: Diagnosis not present

## 2020-05-11 DIAGNOSIS — M545 Low back pain, unspecified: Secondary | ICD-10-CM

## 2020-05-11 DIAGNOSIS — R262 Difficulty in walking, not elsewhere classified: Secondary | ICD-10-CM | POA: Diagnosis not present

## 2020-05-11 NOTE — Therapy (Addendum)
[facility] [address]  [address] [place], [place], [address] Phone: [phone]   Fax:  [phone]  Physical Therapy Treatment  Patient Details  Name: Jeanette Mckee MRN: [id number] Date of Birth: 08-12-1951 Referring Provider (PT): Romero Belling, MD   Encounter Date: 05/11/2020   PT End of Session - 05/11/20 0846    Visit Number 3    Number of Visits 7    Date for PT Re-Evaluation 06/08/20    Authorization Type UHC Medicare    PT Start Time 0800    PT Stop Time 0844    PT Time Calculation (min) 44 min    Activity Tolerance Patient tolerated treatment well    Behavior During Therapy [facility] for tasks assessed/performed           Past Medical History:  Diagnosis Date  . History of chicken pox    childhood  . Hypertension   . Measles as a child  . Other and unspecified hyperlipidemia 02/11/2013  . Preventative health care 02/11/2013    Past Surgical History:  Procedure Laterality Date  . ABDOMINAL HYSTERECTOMY  12/1994   and RSO & LS    There were no vitals filed for this visit.   Subjective Assessment - 05/11/20 0802    Subjective Had to travel over the weekend- had trouble with walking through the airport. Feeling better today. Had not performed HEP since last weekend d/t increased pain with cat/cow until yesterday, when it felt much better.    Pertinent History HLD, HTN    Diagnostic tests none recent    Patient Stated Goals "get to walk on the beach"    Currently in Pain? No/denies              [facility] PT Assessment - 05/11/20 0001      Observation/Other Assessments   Focus on Therapeutic Outcomes (FOTO)  Lumbar: 49 (51% limited, 42% predicted)      Special Tests    Special Tests Leg LengthTest    Leg length test  --   L 83 cm, R 84.5 cm                        [facility] Adult PT Treatment/Exercise - 05/11/20 0001      Lumbar Exercises: Stretches   Other Lumbar Stretch Exercise prayer  stretch with orange pball 5x5"    report of relief     Lumbar Exercises: Aerobic   Nustep L3 x 6 min (LEs only)      Lumbar Exercises: Supine   Ab Set 5 reps    AB Set Limitations 5x5" TrA contraction with cues and demonstration of self-palpation    Bridge with [employer] 10 reps    Bridge with [employer] Limitations limited ROM    Bridge with clamshell 10 reps   red TB above knees   Other Supine Lumbar Exercises B isometric hip flexion with LEs elevated on orange pball 2x5 x5"    Other Supine Lumbar Exercises TrA contraction + alt heel tap x10 each LE      Lumbar Exercises: Quadruped   Madcat/Old Horse 10 reps   cues to avoid pushing into pain with lordosis   Other Quadruped Lumbar Exercises child's pose 5x5"    difficulty setting bottom down to heels                 PT Education - 05/11/20 0845    Education  Details update to HEP; edu on use of heel lift in R shoe for an additional hour/day to promote max comfort    Person(s) Educated Patient    Methods Explanation;Demonstration;Tactile cues;Handout;Verbal cues    Comprehension Verbalized understanding;Returned demonstration            PT Short Term Goals - 05/11/20 0848      PT SHORT TERM GOAL #1   Title Patient to be independent with initial HEP.    Time 3    Period Weeks    Status On-going    Target Date 05/18/20             PT Long Term Goals - 05/11/20 0849      PT LONG TERM GOAL #1   Title Patient to be independent with advanced HEP.    Time 6    Period Weeks    Status On-going      PT LONG TERM GOAL #2   Title Patient to demonstrate lumbar AROM WFL and without c/o discomfort.    Time 6    Period Weeks    Status On-going      PT LONG TERM GOAL #3   Title Patient to demonstrate symmetrical SI alignment for >1 week.    Time 6    Period Weeks    Status On-going      PT LONG TERM GOAL #4   Title Patient to report 60% improvement in tolerance for walking uphill.    Time 6    Period  Weeks    Status On-going      PT LONG TERM GOAL #5   Title Patient to report tolerance for 30 minutes of walking without pain limiting.    Time 6    Period Weeks    Status On-going                 Plan - 05/11/20 0846    Clinical Impression Statement Patient reporting that she experienced increased LBP after traveling over the weekend. Had stopped performing her HEP since traveling d/t increased pain, but felt better after performing the exercises yesterday. Patient demonstrated a 1.5cm leg length discrepancy, with R LE shorter than L LE. Placed 1 cm heel lift in R shoe with patient reporting good improvement in hip balance and symmetry. Worked on progressive hip and core strengthening ther-ex with cueing for proper engagement of core and isolation of TrA. Patient reported relief with prayer stretch, thus updated this into HEP. Patient reported understanding of HEP update and without complaints at end of session.    Comorbidities HLD, HTN    Stability/Clinical Decision Making Stable/Uncomplicated    Rehab Potential Good    PT Frequency 1x / week    PT Duration 6 weeks    PT Treatment/Interventions ADLs/Self Care Home Management;Cryotherapy;Electrical Stimulation;Moist Heat;Traction;Balance training;Therapeutic exercise;Therapeutic activities;Functional mobility training;Stair training;Gait training;Ultrasound;Neuromuscular re-education;Patient/family education;Manual techniques;Taping;Energy conservation;Dry needling;Passive range of motion    PT Next Visit Plan reassess HEP, progress lumbopelvic ROM, jt mobs    Consulted and Agree with Plan of Care Patient           Patient will benefit from skilled therapeutic intervention in order to improve the following deficits and impairments:  Abnormal gait, Decreased activity tolerance, Pain, Increased fascial restricitons, Difficulty walking, Increased muscle spasms, Improper body mechanics, Decreased range of motion, Impaired flexibility,  Postural dysfunction  Visit Diagnosis: Chronic midline low back pain without sciatica  Difficulty in walking, not elsewhere classified  Abnormal posture  Problem List Patient Active Problem List   Diagnosis Date Noted  . Osteopenia 10/24/2019  . Cold sore 04/19/2014  . 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 Other and unspecified hyperlipidemia 02/11/2013  . HTN (hypertension) 02/04/2012  . Abnormal liver enzymes 02/04/2012  . Chronic back pain 02/04/2012     Anette Guarneri, PT, DPT 05/11/20 9:04 AM   [facility] [address]  [address] [place], [place], [address] Phone: [phone]   Fax:  [phone]  Name: WYNONNA FITZHENRY MRN: [id number] Date of Birth: 01/12/1951

## 2020-05-18 ENCOUNTER — Ambulatory Visit: Payer: Medicare Other | Attending: Physical Medicine and Rehabilitation | Admitting: Physical Therapy

## 2020-05-18 ENCOUNTER — Encounter: Payer: Self-pay | Admitting: Physical Therapy

## 2020-05-18 ENCOUNTER — Other Ambulatory Visit: Payer: Self-pay

## 2020-05-18 DIAGNOSIS — R262 Difficulty in walking, not elsewhere classified: Secondary | ICD-10-CM

## 2020-05-18 DIAGNOSIS — G8929 Other chronic pain: Secondary | ICD-10-CM | POA: Diagnosis not present

## 2020-05-18 DIAGNOSIS — M545 Low back pain, unspecified: Secondary | ICD-10-CM

## 2020-05-18 DIAGNOSIS — R293 Abnormal posture: Secondary | ICD-10-CM | POA: Diagnosis not present

## 2020-05-18 NOTE — Therapy (Signed)
[facility] [address]  [address] [place], [place], [address] Phone: [phone]   Fax:  [phone]  Physical Therapy Treatment  Patient Details  Name: Jeanette Mckee MRN: [id number] Date of Birth: Jun 28, 1951 Referring Provider (PT): Romero Belling, MD   Encounter Date: 05/18/2020   PT End of Session - 05/18/20 0929    Visit Number 4    Number of Visits 7    Date for PT Re-Evaluation 06/08/20    Authorization Type U[employer] Medicare    PT Start Time 0847    PT Stop Time 0927    PT Time Calculation (min) 40 min    Activity Tolerance Patient tolerated treatment well    Behavior During Therapy [facility] for tasks assessed/performed           Past Medical History:  Diagnosis Date  . History of chicken pox    childhood  . Hypertension   . Measles as a child  . Other and unspecified hyperlipidemia 02/11/2013  . Preventative health care 02/11/2013    Past Surgical History:  Procedure Laterality Date  . ABDOMINAL HYSTERECTOMY  12/1994   and RSO & LS    There were no vitals filed for this visit.   Subjective Assessment - 05/18/20 0847    Subjective Son had surgery on Friday so she found herself sitting around a bit more, which bothered her back. Starting to feel a bit better today. Has continued using heel wedge witthout issues.    Pertinent History HLD, HTN    Diagnostic tests none recent    Patient Stated Goals "get to walk on the beach"    Currently in Pain? No/denies                             [facility] Adult PT Treatment/Exercise - 05/18/20 0001      Exercises   Exercises Lumbar;Knee/Hip      Lumbar Exercises: Aerobic   Nustep L4 x 6 min (UE/LEs)      Lumbar Exercises: Standing   Wall Slides 15 reps    Wall Slides Limitations 10x with red TB, 5x without    Other Standing Lumbar Exercises thoracic extension at wall 10x to tolerance   cueing for form   Other Standing Lumbar Exercises open book  stretch against wall x10 each side   cues to depress shoulders, more painful on R     Lumbar Exercises: Prone   Other Prone Lumbar Exercises prone hip extension 10x each LE with straight LE, x10 90 deg flexion   VC & TC's to avoid trunk rotation   Other Prone Lumbar Exercises prone on forearms 10x3" to tolerance      Knee/Hip Exercises: Standing   Hip Abduction Stengthening;Right;Left;1 set;10 reps;Knee straight    Abduction Limitations at counter; yellow TB    Hip Extension Stengthening;Right;Left;1 set;10 reps;Knee straight    Extension Limitations at counter; yellow TB      Manual Therapy   Manual Therapy Joint mobilization;Soft tissue mobilization    Manual therapy comments prone    Joint Mobilization grade III/IV central PAs throughout thoracolumbar spine- hypomobiliy evident T12-L5    Soft tissue mobilization STM B lumbar paraspinals- c/o TTP on R                  PT Education - 05/18/20 0929    Education Details update to HEP    Person(s) Educated Patient  Methods Explanation;Demonstration;Tactile cues;Verbal cues;Handout    Comprehension Verbalized understanding;Returned demonstration            PT Short Term Goals - 05/18/20 0933      PT SHORT TERM GOAL #1   Title Patient to be independent with initial HEP.    Time 3    Period Weeks    Status Achieved    Target Date 05/18/20             PT Long Term Goals - 05/11/20 0849      PT LONG TERM GOAL #1   Title Patient to be independent with advanced HEP.    Time 6    Period Weeks    Status On-going      PT LONG TERM GOAL #2   Title Patient to demonstrate lumbar AROM WFL and without c/o discomfort.    Time 6    Period Weeks    Status On-going      PT LONG TERM GOAL #3   Title Patient to demonstrate symmetrical SI alignment for >1 week.    Time 6    Period Weeks    Status On-going      PT LONG TERM GOAL #4   Title Patient to report 60% improvement in tolerance for walking uphill.    Time 6     Period Weeks    Status On-going      PT LONG TERM GOAL #5   Title Patient to report tolerance for 30 minutes of walking without pain limiting.    Time 6    Period Weeks    Status On-going                 Plan - 05/18/20 0929    Clinical Impression Statement Patient reported increased LBP over the weekend d/t increased time spent sitting. Notes no issues when wearing heel wedge on R foot given last session. Worked on gentle lumbopelvic mobility ther-ex for improvement in spinal ROM and relief of stiffness. Patient demonstrated increased challenge to perform hip extension on L vs R LE d/t weakness. Updated HEP with standing open book stretch as patient reported preference for this position- patient reported understanding. Ended session with MT to address spinal mobility and soft tissue restriction. Patient reported no complaints at end of session. Patient progressing well towards goals and tolerating sessions well.    Comorbidities HLD, HTN    Stability/Clinical Decision Making Stable/Uncomplicated    Rehab Potential Good    PT Frequency 1x / week    PT Duration 6 weeks    PT Treatment/Interventions ADLs/Self Care Home Management;Cryotherapy;Electrical Stimulation;Moist Heat;Traction;Balance training;Therapeutic exercise;Therapeutic activities;Functional mobility training;Stair training;Gait training;Ultrasound;Neuromuscular re-education;Patient/family education;Manual techniques;Taping;Energy conservation;Dry needling;Passive range of motion    PT Next Visit Plan progress lumbopelvic ROM, jt mobs    Consulted and Agree with Plan of Care Patient           Patient will benefit from skilled therapeutic intervention in order to improve the following deficits and impairments:  Abnormal gait, Decreased activity tolerance, Pain, Increased fascial restricitons, Difficulty walking, Increased muscle spasms, Improper body mechanics, Decreased range of motion, Impaired flexibility, Postural  dysfunction  Visit Diagnosis: Chronic midline low back pain without sciatica  Difficulty in walking, not elsewhere classified  Abnormal posture     Problem List Patient Active Problem List   Diagnosis Date Noted  . Osteopenia 10/24/2019  . Cold sore 04/19/2014  . 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 Other and unspecified hyperlipidemia 02/11/2013  .  HTN (hypertension) 02/04/2012  . Abnormal liver enzymes 02/04/2012  . Chronic back pain 02/04/2012     Anette Guarneri, PT, DPT 05/18/20 10:19 AM   [facility] [address]  [address] [place], [place], [address] Phone: [phone]   Fax:  [phone]  Name: Jeanette Mckee MRN: [id number] Date of Birth: 1951-03-28

## 2020-06-08 ENCOUNTER — Ambulatory Visit: Payer: Medicare Other | Admitting: Physical Therapy

## 2020-06-08 ENCOUNTER — Encounter: Payer: Self-pay | Admitting: Physical Therapy

## 2020-06-08 ENCOUNTER — Other Ambulatory Visit: Payer: Self-pay

## 2020-06-08 DIAGNOSIS — G8929 Other chronic pain: Secondary | ICD-10-CM | POA: Diagnosis not present

## 2020-06-08 DIAGNOSIS — R262 Difficulty in walking, not elsewhere classified: Secondary | ICD-10-CM

## 2020-06-08 DIAGNOSIS — M545 Low back pain, unspecified: Secondary | ICD-10-CM

## 2020-06-08 DIAGNOSIS — R293 Abnormal posture: Secondary | ICD-10-CM | POA: Diagnosis not present

## 2020-06-08 NOTE — Therapy (Addendum)
[facility] [address]  [place], [place], [address] Phone: [phone]   Fax:  [phone]  Physical Therapy Treatment  Patient Details  Name: Jeanette Mckee MRN: [id number] Date of Birth: [birth year] Referring Provider (PT): Laroy Apple, MD   Progress Note Reporting Period 04/27/20 to 06/08/20  See note below for Objective Data and Assessment of Progress/Goals.     Encounter Date: 06/08/2020   PT End of Session - 06/08/20 0841    Visit Number 5    Number of Visits 7    Date for PT Re-Evaluation 06/08/20    Authorization Type UHC Medicare    PT Start Time 0801    PT Stop Time 0837    PT Time Calculation (min) 36 min    Activity Tolerance Patient tolerated treatment well    Behavior During Therapy [facility] for tasks assessed/performed           Past Medical History:  Diagnosis Date  . History of chicken pox    childhood  . Hypertension   . Measles as a child  . Other and unspecified hyperlipidemia 02/11/2013  . Preventative health care 02/11/2013    Past Surgical History:  Procedure Laterality Date  . ABDOMINAL HYSTERECTOMY  12/1994   and RSO & LS    There were no vitals filed for this visit.   Subjective Assessment - 06/08/20 0803    Subjective Has been feeling really good. Would like to return to the gym and is thinking about working with a Physiological scientist in order to avoid hurting her knees and low back. Feels like her pain levels have improved as well as walking tolerance. Still feels limited in walking and lifting.    Pertinent History HLD, HTN    Diagnostic tests none recent    Patient Stated Goals "get to walk on the beach"    Currently in Pain? Yes    Multiple Pain Sites Yes    Pain Score 2    Pain Location Knee    Pain Orientation Left    Pain Descriptors / Indicators --   "crunchy"   Pain Type Chronic pain              [facility] PT Assessment - 06/08/20 0001      Assessment    Medical Diagnosis Axial LBP    Referring Provider (PT) Laroy Apple, MD    Onset Date/Surgical Date --   chronic since 2007     Observation/Other Assessments   Focus on Therapeutic Outcomes (FOTO)  Lumbar: 53 (47% limited, 42% predicted)      AROM   Lumbar Flexion floor    Lumbar Extension WFL    Lumbar - Right Side Bend jt line    Lumbar - Left Side Bend jt line    Lumbar - Right Rotation WFL   mild LBP   Lumbar - Left Rotation [facility]                         [facility] Adult PT Treatment/Exercise - 06/08/20 0001      Lumbar Exercises: Stretches   Passive Hamstring Stretch Right;1 rep;30 seconds    Passive Hamstring Stretch Limitations supine with strap    Other Lumbar Stretch Exercise B butterfly stretch with OP x30"      Lumbar Exercises: Aerobic   Nustep L5 x 6 min (UE/LEs)      Lumbar Exercises: Quadruped   Other Quadruped  Lumbar Exercises thread the needle x5 each side                  PT Education - 06/08/20 0840    Education Details update/consolidation to HEP, discussion on objective progress and remaining impairments    Person(s) Educated Patient    Methods Explanation;Demonstration;Tactile cues;Verbal cues;Handout    Comprehension Verbalized understanding;Returned demonstration            PT Short Term Goals - 06/08/20 0810      PT SHORT TERM GOAL #1   Title Patient to be independent with initial HEP.    Time 3    Period Weeks    Status Achieved    Target Date 05/18/20             PT Long Term Goals - 06/08/20 0810      PT LONG TERM GOAL #1   Title Patient to be independent with advanced HEP.    Time 6    Period Weeks    Status Achieved      PT LONG TERM GOAL #2   Title Patient to demonstrate lumbar AROM WFL and without c/o discomfort.    Time 6    Period Weeks    Status Partially Met   mild pain with R rotation     PT LONG TERM GOAL #3   Title Patient to demonstrate symmetrical SI alignment for >1 week.    Time 6     Period Weeks    Status On-going   L PSIS depressed and L ASIS elevated     PT LONG TERM GOAL #4   Title Patient to report 60% improvement in tolerance for walking uphill.    Time 6    Period Weeks    Status Partially Met   reports 50% improvement     PT LONG TERM GOAL #5   Title Patient to report tolerance for 30 minutes of walking without pain limiting.    Time 6    Period Weeks    Status On-going   10 minutes                Plan - 06/08/20 0841    Clinical Impression Statement Patient reporting "I have been feeling really good." Does note that she would like to return to the gym and is considering working with a personal trainer in order to avoid flaring up her knee and back pain. Patient notes improvement in pain levels and walking tolerance but still feels limited in walking and lifting. Patient reports 50% improvement in tolerance for walking uphill and notes that she is able to tolerate 10 minutes of walking before onset of LBP or LE N/T. Pelvic alignment in standing showed depressed L PSIS and elevated L ASIS. Lumbar AROM now showing good improvement in B sidebending and with pain levels, with mild pain only remaining with R rotation. Consolidated and updated HEP for max benefit- patient reported understanding. Patient plans on pursuing personal training to return to gym regimen and is agreeable to 30 day hold at this time.    Comorbidities HLD, HTN    Stability/Clinical Decision Making Stable/Uncomplicated    Rehab Potential Good    PT Frequency 1x / week    PT Duration 6 weeks    PT Treatment/Interventions ADLs/Self Care Home Management;Cryotherapy;Electrical Stimulation;Moist Heat;Traction;Balance training;Therapeutic exercise;Therapeutic activities;Functional mobility training;Stair training;Gait training;Ultrasound;Neuromuscular re-education;Patient/family education;Manual techniques;Taping;Energy conservation;Dry needling;Passive range of motion    PT Next Visit Plan 30  day hold at  this time    Consulted and Agree with Plan of Care Patient           Patient will benefit from skilled therapeutic intervention in order to improve the following deficits and impairments:  Abnormal gait, Decreased activity tolerance, Pain, Increased fascial restricitons, Difficulty walking, Increased muscle spasms, Improper body mechanics, Decreased range of motion, Impaired flexibility, Postural dysfunction  Visit Diagnosis: Chronic midline low back pain without sciatica  Difficulty in walking, not elsewhere classified  Abnormal posture     Problem List Patient Active Problem List   Diagnosis Date Noted  . Osteopenia 10/24/2019  . Cold sore 04/19/2014  . 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 Other and unspecified hyperlipidemia 02/11/2013  . HTN (hypertension) 02/04/2012  . Abnormal liver enzymes 02/04/2012  . Chronic back pain 02/04/2012     Janene Harvey, PT, DPT 06/08/20 8:46 AM   [facility] [place], [place], [address] Phone: [phone]   Fax:  [phone]  Name: Jeanette Mckee MRN: [id number] Date of Birth: 12/31/1950   PHYSICAL THERAPY DISCHARGE SUMMARY  Visits from Start of Care: 5  Current functional level related to goals / functional outcomes: See above clinical impression; patient did not return during 30 day hold    Remaining deficits: Pain with lumbar AROM, SIJ asymmetry, decreased walking tolerance   Education / Equipment: HEP  Plan: Patient agrees to discharge.  Patient goals were partially met. Patient is being discharged due to being pleased with the current functional level.  ?????     Janene Harvey, PT, DPT 07/20/20 11:43 AM

## 2020-06-15 ENCOUNTER — Ambulatory Visit: Payer: Medicare Other | Admitting: Physical Therapy

## 2020-07-15 ENCOUNTER — Other Ambulatory Visit: Payer: Self-pay | Admitting: Family Medicine

## 2020-07-15 DIAGNOSIS — I10 Essential (primary) hypertension: Secondary | ICD-10-CM

## 2020-07-15 DIAGNOSIS — N952 Postmenopausal atrophic vaginitis: Secondary | ICD-10-CM

## 2020-10-09 NOTE — Patient Instructions (Signed)
It was good to see you again today, I will be in touch with your labs soon as possible We will also set you up for ABI testing to evaluate the circulation in your legs  Please plan to get your flu vaccine and covid booster at your convneience Please consider getting the shingles vaccine at your drugstore if not done already   Health Maintenance After Age 71 After age 65, you are at a higher risk for certain long-term diseases and infections as well as injuries from falls. Falls are a major cause of broken bones and head injuries in people who are older than age 17. Getting regular preventive care can help to keep you healthy and well. Preventive care includes getting regular testing and making lifestyle changes as recommended by your health care provider. Talk with your health care provider about:  Which screenings and tests you should have. A screening is a test that checks for a disease when you have no symptoms.  A diet and exercise plan that is right for you. What should I know about screenings and tests to prevent falls? Screening and testing are the best ways to find a health problem early. Early diagnosis and treatment give you the best chance of managing medical conditions that are common after age 87. Certain conditions and lifestyle choices may make you more likely to have a fall. Your health care provider may recommend:  Regular vision checks. Poor vision and conditions such as cataracts can make you more likely to have a fall. If you wear glasses, make sure to get your prescription updated if your vision changes.  Medicine review. Work with your health care provider to regularly review all of the medicines you are taking, including over-the-counter medicines. Ask your health care provider about any side effects that may make you more likely to have a fall. Tell your health care provider if any medicines that you take make you feel dizzy or sleepy.  Osteoporosis screening. Osteoporosis  is a condition that causes the bones to get weaker. This can make the bones weak and cause them to break more easily.  Blood pressure screening. Blood pressure changes and medicines to control blood pressure can make you feel dizzy.  Strength and balance checks. Your health care provider may recommend certain tests to check your strength and balance while standing, walking, or changing positions.  Foot health exam. Foot pain and numbness, as well as not wearing proper footwear, can make you more likely to have a fall.  Depression screening. You may be more likely to have a fall if you have a fear of falling, feel emotionally low, or feel unable to do activities that you used to do.  Alcohol use screening. Using too much alcohol can affect your balance and may make you more likely to have a fall. What actions can I take to lower my risk of falls? General instructions  Talk with your health care provider about your risks for falling. Tell your health care provider if: ? You fall. Be sure to tell your health care provider about all falls, even ones that seem minor. ? You feel dizzy, sleepy, or off-balance.  Take over-the-counter and prescription medicines only as told by your health care provider. These include any supplements.  Eat a healthy diet and maintain a healthy weight. A healthy diet includes low-fat dairy products, low-fat (lean) meats, and fiber from whole grains, beans, and lots of fruits and vegetables. Home safety  Remove any tripping hazards, such  as rugs, cords, and clutter.  Install safety equipment such as grab bars in bathrooms and safety rails on stairs.  Keep rooms and walkways well-lit. Activity   Follow a regular exercise program to stay fit. This will help you maintain your balance. Ask your health care provider what types of exercise are appropriate for you.  If you need a cane or walker, use it as recommended by your health care provider.  Wear supportive  shoes that have nonskid soles. Lifestyle  Do not drink alcohol if your health care provider tells you not to drink.  If you drink alcohol, limit how much you have: ? 0-1 drink a day for women. ? 0-2 drinks a day for men.  Be aware of how much alcohol is in your drink. In the [place] one drink equals one typical bottle of beer (12 oz), one-half glass of wine (5 oz), or one shot of hard liquor (1 oz).  Do not use any products that contain nicotine or tobacco, such as cigarettes and e-cigarettes. If you need help quitting, ask your health care provider. Summary  Having a healthy lifestyle and getting preventive care can help to protect your health and wellness after age 22.  Screening and testing are the best way to find a health problem early and help you avoid having a fall. Early diagnosis and treatment give you the best chance for managing medical conditions that are more common for people who are older than age 72.  Falls are a major cause of broken bones and head injuries in people who are older than age 43. Take precautions to prevent a fall at home.  Work with your health care provider to learn what changes you can make to improve your health and wellness and to prevent falls. This information is not intended to replace advice given to you by your health care provider. Make sure you discuss any questions you have with your health care provider. Document Revised: 02/20/2019 Document Reviewed: 09/12/2017 Elsevier Patient Education  2020 [employer].

## 2020-10-09 NOTE — Progress Notes (Addendum)
[facility] [address], [address] [place], [place] [address] [phone]  Date:  10/14/2020   Name:  Jeanette Mckee   DOB:  03-20-1951   MRN:  [id number]  PCP:  Pearline Cables, MD    Chief Complaint: Annual Exam   History of Present Illness:  Jeanette Mckee is a 69 y.o. very pleasant female patient who presents with the following:  Patient here today for physical exam-history of hypertension, osteopenia Last seen by myself in March of this year with earwax  She has 2 children, 8 grandchildren, 3 great grands She enjoys volunteer work  Flu vaccine- wants to do at a later date due to plans for this weekend Mammogram due this month- she will do in January  Cologuard up-to-date Bone density scan 1 year ago-osteopenia COVID-19 booster; recommended a booster asap  Shingrix- will do at a later date  Lab work 1 year ago- she is fasting for labs today  Amlodipine 5 Diclofenac as needed Topical vaginal estrogen Lisinopril/HCTZ  She notes hearing loss again - would like to check her ears  She saw her ortho at [facility] not long ago- she is doing some PT and also exercise with a personal trainer for her back which has helped She is getting some left ankle swelling esp if she is on an airplane.   This occurs if she is not moving around as much Swelling occurs only on the left  She does have some calf cramping with exercise suggestive of claudication- this has gotten better with her exercise routine   Patient Active Problem List   Diagnosis Date Noted  . Osteopenia 10/24/2019  . Cold sore 04/19/2014  . 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 Other and unspecified hyperlipidemia 02/11/2013  . HTN (hypertension) 02/04/2012  . Abnormal liver enzymes 02/04/2012  . Chronic back pain 02/04/2012    Past Medical History:  Diagnosis Date  . History of chicken pox    childhood  . Hypertension   . Measles as a child   . Other and unspecified hyperlipidemia 02/11/2013  . Preventative health care 02/11/2013    Past Surgical History:  Procedure Laterality Date  . ABDOMINAL HYSTERECTOMY  12/1994   and RSO & LS    Social History   Tobacco Use  . Smoking status: Former Smoker    Years: 20.00    Quit date: 11/13/2001    Years since quitting: 18.9  . Smokeless tobacco: Never Used  . Tobacco comment: quit in 2003 1 ppd for 20 years  Substance Use Topics  . Alcohol use: Yes    Comment: WINE FRIDAY AND SATURDAY  . Drug use: No    Family History  Problem Relation Age of Onset  . Alcohol abuse Father   . Heart disease Father   . Stroke Father   . Hypertension Father   . Diabetes Father        type 2  . Cancer Father        prostate  . Heart disease Mother   . Hypertension Mother   . Arthritis Mother   . Heart attack Mother 54  . Crohn's disease Mother   . Cancer Maternal Grandmother        bone- nonsmoker  . Heart disease Son        MI s/p 3 stents, January 2015  . Breast cancer Neg Hx   . Colon cancer Neg Hx  No Known Allergies  Medication list has been reviewed and updated.  Current Outpatient Medications on File Prior to Visit  Medication Sig Dispense Refill  . amLODipine (NORVASC) 5 MG tablet Take 1 tablet (5 mg total) by mouth daily. 90 tablet 1  . diclofenac (CATAFLAM) 50 MG tablet TAKE 1 TABLET BY MOUTH  TWICE DAILY AS NEEDED 180 tablet 3  . estradiol (ESTRACE) 0.1 MG/GM vaginal cream APPLY 1 TO 2 GRAMS  VAGINALLY UP TO 3 TIMES  WEEKLY AS NEEDED 85 g 1  . lisinopril-hydrochlorothiazide (ZESTORETIC) 20-12.5 MG tablet Take 2 tablets by mouth daily. 180 tablet 1  . Multiple Vitamin (MULTIVITAMIN) tablet Take 1 tablet by mouth daily.     No current facility-administered medications on file prior to visit.    Review of Systems:  As per HPI- otherwise negative.   Physical Examination: Vitals:   10/14/20 0823  BP: (!) 142/80  Pulse: 74  Resp: 16  SpO2: 95%   Vitals:    10/14/20 0823  Weight: 184 lb (83.5 kg)  Height: 5\' 5"  (1.651 m)   Body mass index is 30.62 kg/m. Ideal Body Weight: Weight in (lb) to have BMI = 25: 149.9  GEN: no acute distress.  Overweight, looks well HEENT: Atraumatic, Normocephalic.  Ears and Nose: No external deformity. CV: RRR, No M/G/R. No JVD. No thrill. No extra heart sounds. PULM: CTA B, no wheezes, crackles, rhonchi. No retractions. No resp. distress. No accessory muscle use. ABD: S, NT, ND, +BS. No rebound. No HSM. EXTR: No c/c/e PSYCH: Normally interactive. Conversant.  Impacted cerumen in both ears-irrigation procedure discussed with patient, she agrees to proceed.  Earwax irrigated and removed bilaterally, no complications.  Patient tolerated well Foot exam done today-normal sensation and pulses bilateral Assessment and Plan: Physical exam  Essential hypertension - Plan: CBC, Comprehensive metabolic panel  Mixed hyperlipidemia - Plan: Lipid panel  Screening for diabetes mellitus - Plan: Comprehensive metabolic panel, Hemoglobin A1c  Screening for deficiency anemia - Plan: CBC  Screening for thyroid disorder - Plan: TSH  Vaginal atrophy  Fatigue, unspecified type - Plan: TSH, VITAMIN D 25 Hydroxy (Vit-D Deficiency, Fractures)  Numbness and tingling of both feet - Plan: B12 and Folate Panel  Intermittent claudication (HCC) - Plan: VAS ABI WITH/WO TBI  Hearing loss of right ear due to cerumen impaction  Physical exam today Encouraged healthy diet and exercise routine, discussed health maintenance immunizations Labs are pending as above Suspect that the numbness and tingling she has in her feet is likely due to her spinal stenosis.  Will check B12 and folate.  At this time she is not bothered enough to consider medication such as gabapentin She does have possible symptoms of claudication, especially on the left.  Will obtain ABI testing Encouraged her to use compression socks for likely venous  insufficiency swelling This visit occurred during the SARS-CoV-2 public health emergency.  Safety protocols were in place, including screening questions prior to the visit, additional usage of staff PPE, and extensive cleaning of exam room while observing appropriate contact time as indicated for disinfecting solutions.    Signed [place], MD   Received her labs as below, message to patient  Results for orders placed or performed in visit on 10/14/20  CBC  Result Value Ref Range   WBC 6.4 4.0 - 10.5 K/uL   RBC 4.62 3.87 - 5.11 Mil/uL   Platelets 279.0 150 - 400 K/uL   Hemoglobin 14.0 12.0 - 15.0 g/dL  HCT 41.7 36 - 46 %   MCV 90.4 78.0 - 100.0 fl   MCHC 33.6 30.0 - 36.0 g/dL   RDW 62.7 03.5 - 00.9 %  Comprehensive metabolic panel  Result Value Ref Range   Sodium 138 135 - 145 mEq/L   Potassium 4.4 3.5 - 5.1 mEq/L   Chloride 99 96 - 112 mEq/L   CO2 32 19 - 32 mEq/L   Glucose, Bld 89 70 - 99 mg/dL   BUN 18 6 - 23 mg/dL   Creatinine, Ser 3.81 0.40 - 1.20 mg/dL   Total Bilirubin 0.5 0.2 - 1.2 mg/dL   Alkaline Phosphatase 105 39 - 117 U/L   AST 24 0 - 37 U/L   ALT 24 0 - 35 U/L   Total Protein 7.4 6.0 - 8.3 g/dL   Albumin 4.2 3.5 - 5.2 g/dL   GFR 82.99 >37.16 mL/min   Calcium 9.8 8.4 - 10.5 mg/dL  Hemoglobin R6V  Result Value Ref Range   Hgb A1c MFr Bld 5.5 4.6 - 6.5 %  Lipid panel  Result Value Ref Range   Cholesterol 218 (H) 0 - 200 mg/dL   Triglycerides 89.3 0 - 149 mg/dL   HDL 81.01 >75.10 mg/dL   VLDL 25.8 0.0 - 52.7 mg/dL   LDL Cholesterol 782 (H) 0 - 99 mg/dL   Total CHOL/HDL Ratio 3    NonHDL 135.16   TSH  Result Value Ref Range   TSH 0.81 0.35 - 4.50 uIU/mL  VITAMIN D 25 Hydroxy (Vit-D Deficiency, Fractures)  Result Value Ref Range   VITD 40.19 30.00 - 100.00 ng/mL  B12 and Folate Panel  Result Value Ref Range   Vitamin B-12 339 211 - 911 pg/mL   Folate >23.6 >5.9 ng/mL

## 2020-10-14 ENCOUNTER — Other Ambulatory Visit: Payer: Self-pay

## 2020-10-14 ENCOUNTER — Encounter: Payer: Self-pay | Admitting: Family Medicine

## 2020-10-14 ENCOUNTER — Ambulatory Visit (INDEPENDENT_AMBULATORY_CARE_PROVIDER_SITE_OTHER): Payer: Medicare Other | Admitting: Family Medicine

## 2020-10-14 VITALS — BP 142/80 | HR 74 | Resp 16 | Ht 65.0 in | Wt 184.0 lb

## 2020-10-14 DIAGNOSIS — R202 Paresthesia of skin: Secondary | ICD-10-CM

## 2020-10-14 DIAGNOSIS — I739 Peripheral vascular disease, unspecified: Secondary | ICD-10-CM

## 2020-10-14 DIAGNOSIS — R5383 Other fatigue: Secondary | ICD-10-CM

## 2020-10-14 DIAGNOSIS — N952 Postmenopausal atrophic vaginitis: Secondary | ICD-10-CM

## 2020-10-14 DIAGNOSIS — E782 Mixed hyperlipidemia: Secondary | ICD-10-CM

## 2020-10-14 DIAGNOSIS — R200 Anesthesia of skin: Secondary | ICD-10-CM | POA: Diagnosis not present

## 2020-10-14 DIAGNOSIS — Z0000 Encounter for general adult medical examination without abnormal findings: Secondary | ICD-10-CM

## 2020-10-14 DIAGNOSIS — H6121 Impacted cerumen, right ear: Secondary | ICD-10-CM

## 2020-10-14 DIAGNOSIS — I10 Essential (primary) hypertension: Secondary | ICD-10-CM

## 2020-10-14 DIAGNOSIS — Z1329 Encounter for screening for other suspected endocrine disorder: Secondary | ICD-10-CM | POA: Diagnosis not present

## 2020-10-14 DIAGNOSIS — Z131 Encounter for screening for diabetes mellitus: Secondary | ICD-10-CM

## 2020-10-14 DIAGNOSIS — Z130 Encounter for screening for diseases of the blood and blood-forming organs and certain disorders involving the immune mechanism: Secondary | ICD-10-CM

## 2020-10-14 LAB — LIPID PANEL
Cholesterol: 218 mg/dL — ABNORMAL HIGH (ref 0–200)
HDL: 83.3 mg/dL (ref >39.00)
LDL Cholesterol: 123 mg/dL — ABNORMAL HIGH (ref 0–99)
NonHDL: 135.16
Total CHOL/HDL Ratio: 3
Triglycerides: 59 mg/dL (ref 0.0–149.0)
VLDL: 11.8 mg/dL (ref 0.0–40.0)

## 2020-10-14 LAB — COMPREHENSIVE METABOLIC PANEL
ALT: 24 U/L (ref 0–35)
AST: 24 U/L (ref 0–37)
Albumin: 4.2 g/dL (ref 3.5–5.2)
Alkaline Phosphatase: 105 U/L (ref 39–117)
BUN: 18 mg/dL (ref 6–23)
CO2: 32 mEq/L (ref 19–32)
Calcium: 9.8 mg/dL (ref 8.4–10.5)
Chloride: 99 mEq/L (ref 96–112)
Creatinine, Ser: 0.61 mg/dL (ref 0.40–1.20)
GFR: 90.12 mL/min (ref >60.00)
Glucose, Bld: 89 mg/dL (ref 70–99)
Potassium: 4.4 mEq/L (ref 3.5–5.1)
Sodium: 138 mEq/L (ref 135–145)
Total Bilirubin: 0.5 mg/dL (ref 0.2–1.2)
Total Protein: 7.4 g/dL (ref 6.0–8.3)

## 2020-10-14 LAB — CBC
HCT: 41.7 % (ref 36.0–46.0)
Hemoglobin: 14 g/dL (ref 12.0–15.0)
MCHC: 33.6 g/dL (ref 30.0–36.0)
MCV: 90.4 fl (ref 78.0–100.0)
Platelets: 279 10*3/uL (ref 150.0–400.0)
RBC: 4.62 Mil/uL (ref 3.87–5.11)
RDW: 14.1 % (ref 11.5–15.5)
WBC: 6.4 10*3/uL (ref 4.0–10.5)

## 2020-10-14 LAB — TSH: TSH: 0.81 u[IU]/mL (ref 0.35–4.50)

## 2020-10-14 LAB — B12 AND FOLATE PANEL
Folate: >23.6 ng/mL (ref >5.9)
Vitamin B-12: 339 pg/mL (ref 211–911)

## 2020-10-14 LAB — VITAMIN D 25 HYDROXY (VIT D DEFICIENCY, FRACTURES): VITD: 40.19 ng/mL (ref 30.00–100.00)

## 2020-10-14 LAB — HEMOGLOBIN A1C: Hgb A1c MFr Bld: 5.5 % (ref 4.6–6.5)

## 2020-11-09 ENCOUNTER — Encounter: Payer: Self-pay | Admitting: Family Medicine

## 2020-11-16 ENCOUNTER — Ambulatory Visit (HOSPITAL_COMMUNITY)
Admission: RE | Admit: 2020-11-16 | Discharge: 2020-11-16 | Disposition: A | Discharge status: Home / Self Care | Payer: Medicare Other | Source: Ambulatory Visit | Attending: Family Medicine | Admitting: Family Medicine

## 2020-11-16 ENCOUNTER — Encounter: Payer: Self-pay | Admitting: Family Medicine

## 2020-11-16 ENCOUNTER — Other Ambulatory Visit: Payer: Self-pay

## 2020-11-16 DIAGNOSIS — I739 Peripheral vascular disease, unspecified: Secondary | ICD-10-CM | POA: Diagnosis not present

## 2020-12-10 ENCOUNTER — Other Ambulatory Visit: Payer: Self-pay | Admitting: Family Medicine

## 2020-12-10 DIAGNOSIS — N952 Postmenopausal atrophic vaginitis: Secondary | ICD-10-CM

## 2020-12-14 DIAGNOSIS — M47816 Spondylosis without myelopathy or radiculopathy, lumbar region: Secondary | ICD-10-CM | POA: Diagnosis not present

## 2020-12-14 DIAGNOSIS — M48061 Spinal stenosis, lumbar region without neurogenic claudication: Secondary | ICD-10-CM | POA: Diagnosis not present

## 2020-12-14 DIAGNOSIS — M545 Low back pain, unspecified: Secondary | ICD-10-CM | POA: Diagnosis not present

## 2020-12-20 DIAGNOSIS — M545 Low back pain, unspecified: Secondary | ICD-10-CM | POA: Diagnosis not present

## 2020-12-28 ENCOUNTER — Other Ambulatory Visit: Payer: Self-pay | Admitting: Family Medicine

## 2020-12-28 DIAGNOSIS — M545 Low back pain, unspecified: Secondary | ICD-10-CM | POA: Diagnosis not present

## 2020-12-28 DIAGNOSIS — I10 Essential (primary) hypertension: Secondary | ICD-10-CM

## 2020-12-28 DIAGNOSIS — Z0000 Encounter for general adult medical examination without abnormal findings: Secondary | ICD-10-CM

## 2020-12-28 DIAGNOSIS — M48061 Spinal stenosis, lumbar region without neurogenic claudication: Secondary | ICD-10-CM | POA: Diagnosis not present

## 2021-01-06 DIAGNOSIS — M5416 Radiculopathy, lumbar region: Secondary | ICD-10-CM | POA: Diagnosis not present

## 2021-01-18 ENCOUNTER — Other Ambulatory Visit (HOSPITAL_BASED_OUTPATIENT_CLINIC_OR_DEPARTMENT_OTHER): Payer: Self-pay | Admitting: Family Medicine

## 2021-01-18 DIAGNOSIS — Z1231 Encounter for screening mammogram for malignant neoplasm of breast: Secondary | ICD-10-CM

## 2021-01-25 ENCOUNTER — Other Ambulatory Visit: Payer: Self-pay

## 2021-01-25 ENCOUNTER — Ambulatory Visit (HOSPITAL_BASED_OUTPATIENT_CLINIC_OR_DEPARTMENT_OTHER)
Admission: RE | Admit: 2021-01-25 | Discharge: 2021-01-25 | Disposition: A | Discharge status: Home / Self Care | Payer: Medicare Other | Source: Ambulatory Visit | Attending: Family Medicine | Admitting: Family Medicine

## 2021-01-25 ENCOUNTER — Encounter (HOSPITAL_BASED_OUTPATIENT_CLINIC_OR_DEPARTMENT_OTHER): Payer: Self-pay

## 2021-01-25 DIAGNOSIS — Z1231 Encounter for screening mammogram for malignant neoplasm of breast: Secondary | ICD-10-CM | POA: Diagnosis not present

## 2021-01-27 DIAGNOSIS — M5416 Radiculopathy, lumbar region: Secondary | ICD-10-CM | POA: Diagnosis not present

## 2021-02-21 DIAGNOSIS — M5416 Radiculopathy, lumbar region: Secondary | ICD-10-CM | POA: Diagnosis not present

## 2021-03-15 DIAGNOSIS — M47816 Spondylosis without myelopathy or radiculopathy, lumbar region: Secondary | ICD-10-CM | POA: Diagnosis not present

## 2021-04-21 DIAGNOSIS — M5416 Radiculopathy, lumbar region: Secondary | ICD-10-CM | POA: Diagnosis not present

## 2021-04-21 DIAGNOSIS — M48061 Spinal stenosis, lumbar region without neurogenic claudication: Secondary | ICD-10-CM | POA: Diagnosis not present

## 2021-04-21 DIAGNOSIS — M47816 Spondylosis without myelopathy or radiculopathy, lumbar region: Secondary | ICD-10-CM | POA: Diagnosis not present

## 2021-04-22 ENCOUNTER — Telehealth: Payer: Medicare Other | Admitting: Physician Assistant

## 2021-04-22 DIAGNOSIS — Z20822 Contact with and (suspected) exposure to covid-19: Secondary | ICD-10-CM

## 2021-04-22 MED ORDER — BENZONATATE 100 MG PO CAPS: 100.0000 mg | ORAL_CAPSULE | Freq: Three times a day (TID) | ORAL | 0 refills | Status: DC | PRN

## 2021-04-22 MED ORDER — FLUTICASONE PROPIONATE 50 MCG/ACT NA SUSP: 2.0000 | Freq: Every day | NASAL | 6 refills | Status: DC

## 2021-04-22 NOTE — Progress Notes (Signed)
E-Visit for [employer]  Your current symptoms could be consistent with the coronavirus.  Many health care providers can now test patients at their office but not all are.  [place] has multiple testing sites. For information on our COVID testing locations and hours go to [email or web address]  We are enrolling you in our MyChart Home Monitoring for COVID19 . Daily you will receive a questionnaire within the MyChart website. Our COVID 19 response team will be monitoring your responses daily.  Testing Information: The COVID-19 Community Testing sites are testing BY APPOINTMENT ONLY.  You can schedule online at [email or web address]  If you do not have access to a smart phone or computer you may call [phone] for an appointment.   Additional testing sites in the Community:  For [facility] Testing sites in [place]  [email or web address]  For Pop-up testing sites in [place]  [email or web address]  For [email or web address]  For [facility] testing in [place] and [employer] [email or web address]  For [facility] testing in [place]   [email or web address]  For  more information about community testing call [phone]   Please quarantine yourself while awaiting your test results. Please stay home for a minimum of 10 days from the first day of illness with improving symptoms and you have had 24 hours of no fever (without the use of Tylenol (Acetaminophen) Motrin (Ibuprofen) or any fever reducing medication).  Also - Do not get tested prior to returning to work because once you have had a positive test the test can stay positive for  more than a month in some cases.   You should wear a mask or cloth face covering over your nose and mouth if you must be around other people or animals, including pets (even at home). Try to stay at least 6 feet away from other people. This will protect the people around you.  Please continue good preventive care measures, including:  frequent hand-washing, avoid touching your face, cover coughs/sneezes, stay out of crowds and keep a 6 foot distance from others.  COVID-19 is a respiratory illness with symptoms that are similar to the flu. Symptoms are typically mild to moderate, but there have been cases of severe illness and death due to the virus.   The following symptoms may appear 2-14 days after exposure: Fever Cough Shortness of breath or difficulty breathing Chills Repeated shaking with chills Muscle pain Headache Sore throat New loss of taste or smell Fatigue Congestion or runny nose Nausea or vomiting Diarrhea  Go to the nearest hospital ED for assessment if fever/cough/breathlessness are severe or illness seems like a threat to life.  It is vitally important that if you feel that you have an infection such as this virus or any other virus that you stay home and away from places where you may spread it to others.  You should avoid contact with people age 24 and older.   You can use medication such as prescription cough medication called Tessalon Perles 100 mg. You may take 1-2 capsules every 8 hours as needed for cough and prescription for Fluticasone nasal spray 2 sprays in each nostril one time per day  You may also take acetaminophen (Tylenol) as needed for fever.  Reduce your risk of any infection by using the same precautions used for avoiding the common cold or flu:  Wash your hands often with soap and warm water for at least 20 seconds.  If soap and water are not readily available, use an alcohol-based  hand sanitizer with at least 60% alcohol.  If coughing or sneezing,  cover your mouth and nose by coughing or sneezing into the elbow areas of your shirt or coat, into a tissue or into your sleeve (not your hands). Avoid shaking hands with others and consider head nods or verbal greetings only. Avoid touching your eyes, nose, or mouth with unwashed hands.  Avoid close contact with people who are sick. Avoid places or events with large numbers of people in one location, like concerts or sporting events. Carefully consider travel plans you have or are making. If you are planning any travel outside or inside the [place], visit the CDC's Travelers' Health webpage for the latest health notices. If you have some symptoms but not all symptoms, continue to monitor at home and seek medical attention if your symptoms worsen. If you are having a medical emergency, call 911.  HOME CARE Only take medications as instructed by your medical team. Drink plenty of fluids and get plenty of rest. A steam or ultrasonic humidifier can help if you have congestion.   GET HELP RIGHT AWAY IF YOU HAVE EMERGENCY WARNING SIGNS** FOR COVID-19. If you or someone is showing any of these signs seek emergency medical care immediately. Call 911 or proceed to your closest emergency facility if: You develop worsening high fever. Trouble breathing Bluish lips or face Persistent pain or pressure in the chest New confusion Inability to wake or stay awake You cough up blood. Your symptoms become more severe  **This list is not all possible symptoms. Contact your medical provider for any symptoms that are sever or concerning to you.  MAKE SURE YOU  Understand these instructions. Will watch your condition. Will get help right away if you are not doing well or get worse.  Your e-visit answers were reviewed by a board certified advanced clinical practitioner to complete your personal care plan.  Depending on the condition, your plan could have included both over the counter or prescription  medications.  If there is a problem please reply once you have received a response from your provider.  Your safety is important to [place].  If you have drug allergies check your prescription carefully.    You can use MyChart to ask questions about today's visit, request a non-urgent call back, or ask for a work or school excuse for 24 hours related to this e-Visit. If it has been greater than 24 hours you will need to follow up with your provider, or enter a new e-Visit to address those concerns. You will get an e-mail in the next two days asking about your experience.  I hope that your e-visit has been valuable and will speed your recovery. Thank you for using e-visits.  I provided 6 minutes of non face-to-face time during this encounter for chart review and documentation.

## 2021-04-24 ENCOUNTER — Telehealth: Payer: Medicare Other | Admitting: Nurse Practitioner

## 2021-04-24 DIAGNOSIS — J029 Acute pharyngitis, unspecified: Secondary | ICD-10-CM

## 2021-04-24 NOTE — Progress Notes (Signed)
E-Visit for Sore Throat  We are sorry that you are not feeling well.  Here is how we plan to help!  Your symptoms indicate a likely viral infection (Pharyngitis).   Pharyngitis is inflammation in the back of the throat which can cause a sore throat, scratchiness and sometimes difficulty swallowing.   Pharyngitis is typically caused by a respiratory virus and will just run its course.  Please keep in mind that your symptoms could last up to 10 days.  For throat pain, we recommend over the counter oral pain relief medications such as acetaminophen or aspirin, or anti-inflammatory medications such as ibuprofen or naproxen sodium.  Topical treatments such as oral throat lozenges or sprays may be used as needed.  Avoid close contact with loved ones, especially the very young and elderly.  Remember to wash your hands thoroughly throughout the day as this is the number one way to prevent the spread of infection and wipe down door knobs and counters with disinfectant.  After careful review of your answers, I would not recommend and antibiotic for your condition.  Antibiotics should not be used to treat conditions that we suspect are caused by viruses like the virus that causes the common cold or flu. However, some people can have Strep with atypical symptoms. You may need formal testing in clinic or office to confirm if your symptoms continue or worsen.  Providers prescribe antibiotics to treat infections caused by bacteria. Antibiotics are very powerful in treating bacterial infections when they are used properly.  To maintain their effectiveness, they should be used only when necessary.  Overuse of antibiotics has resulted in the development of super bugs that are resistant to treatment!    I do recommend getting testing for COVID-19. For your convenience, I am providing the local testing sites in the surrounding area:  Testing Information: The COVID-19 Community Testing sites are testing BY APPOINTMENT ONLY.   You can schedule online at [email or web address]  If you do not have access to a smart phone or computer you may call [phone] for an appointment.   Additional testing sites in the Community:  For [facility] Testing sites in [place]  [email or web address]  For Pop-up testing sites in [place]  [email or web address]  For [email or web address]  For [facility] testing in [place] and [employer] [email or web address]  For [facility] testing in [place]   [email or web address]  For  more information about community testing call [phone]   Home Care: Only take medications as instructed by your medical team. Do not drink alcohol while taking these medications. A steam or ultrasonic humidifier can help congestion.  You can place a towel over your head and breathe in the steam from hot water coming from a faucet. Avoid close contacts especially the very young and the elderly. Cover your mouth when you cough or sneeze. Always remember to wash your hands.  Get Help Right Away If: You develop worsening fever or throat pain. You develop a severe head ache or visual changes. Your symptoms persist after you have completed your treatment plan.  Make sure you Understand these instructions. Will watch your condition. Will get help right away if you are not doing well or get worse.  Your e-visit answers were reviewed by a board certified advanced clinical practitioner to complete your personal care plan.  Depending on the condition, your plan could have included both over the counter or prescription medications.  If there is a problem please  reply  once  you have received a response from your provider.  Your safety is important to [place].  If you have drug allergies check your prescription carefully.    You can use MyChart to ask questions about todays visit, request a non-urgent call back, or ask for a work or school excuse for 24 hours related to this e-Visit. If it has been greater than 24 hours you will need to follow up with your provider, or enter a new e-Visit to address those concerns.  You will get an e-mail in the next two days asking about your experience.  I hope that your e-visit has been valuable and will speed your recovery. Thank you for using e-visits.   I have spent at least 5 minutes reviewing and documenting in the patient's chart.

## 2021-04-25 ENCOUNTER — Other Ambulatory Visit: Payer: Self-pay

## 2021-04-25 ENCOUNTER — Encounter: Payer: Self-pay | Admitting: Family Medicine

## 2021-04-25 ENCOUNTER — Ambulatory Visit (INDEPENDENT_AMBULATORY_CARE_PROVIDER_SITE_OTHER): Payer: Medicare Other | Admitting: Family Medicine

## 2021-04-25 VITALS — BP 120/80 | HR 79 | Temp 98.1°F | Ht 65.5 in | Wt 181.4 lb

## 2021-04-25 DIAGNOSIS — J029 Acute pharyngitis, unspecified: Secondary | ICD-10-CM | POA: Diagnosis not present

## 2021-04-25 DIAGNOSIS — K1120 Sialoadenitis, unspecified: Secondary | ICD-10-CM | POA: Diagnosis not present

## 2021-04-25 NOTE — Progress Notes (Signed)
Chief Complaint  Patient presents with   Sore Throat   Cough   Fatigue    Jeanette Mckee here for URI complaints.  Duration: 6 days  Associated symptoms: sinus congestion, sore throat, and cough, swollen glands Denies: sinus pain, rhinorrhea, itchy watery eyes, ear pain, ear drainage, wheezing, shortness of breath, myalgia, and fevers Treatment to date: Tylenol Sick contacts: Yes; spouse starting to feel better  Past Medical History:  Diagnosis Date   History of chicken pox    childhood   Hypertension    Measles as a child   Other and unspecified hyperlipidemia 02/11/2013   Preventative health care 02/11/2013    BP 120/80 (BP Location: Left Arm, Patient Position: Sitting, Cuff Size: Normal)   Pulse 79   Temp 98.1 F (36.7 C) (Oral)   Ht 5' 5.5" (1.664 m)   Wt 181 lb 6 oz (82.3 kg)   SpO2 93%   BMI 29.72 kg/m  General: Awake, alert, appears stated age HEENT: AT, , ears patent b/l and TM's neg, nares patent w/o discharge, pharynx pink and without exudates, MMM Neck: No asymmetry, submandibular glands tender to palpation and swollen bilaterally Heart: RRR Lungs: CTAB, no accessory muscle use Psych: Age appropriate judgment and insight, normal mood and affect  Sore throat  Submandibular gland inflammation  She has a Medrol Dosepak at home that was given to her by her Ortho team.  She does not improve over the next few days, she will start taking the medication.  She will stop her diclofenac which she does take this.  Continue Tylenol. 0/4 Centor criteria.  No need to swab. Continue to push fluids, practice good hand hygiene, cover mouth when coughing. F/u prn. If starting to experience fevers, shaking, or shortness of breath, seek immediate care. Pt voiced understanding and agreement to the plan.  [name], DO 04/25/21 4:22 PM

## 2021-04-25 NOTE — Patient Instructions (Addendum)
Continue to push fluids, practice good hand hygiene, and cover your mouth if you cough.  If you start having fevers, shaking or shortness of breath, seek immediate care.  The cough medicine is good for 8 hours and should not make you drowsy.  If you aren't turning the corner in the next 1-2 days, take the prednisone/steroid. Don't take diclofenac while on it.   Foods that may reduce pain: 1) Ginger 2) Blueberries 3) Salmon 4) Pumpkin seeds 5) dark chocolate 6) turmeric 7) tart cherries 8) virgin olive oil 9) chilli peppers 10) mint 11) red wine  Let us know if you need anything.

## 2021-07-05 DIAGNOSIS — M4316 Spondylolisthesis, lumbar region: Secondary | ICD-10-CM | POA: Diagnosis not present

## 2021-07-05 DIAGNOSIS — I10 Essential (primary) hypertension: Secondary | ICD-10-CM | POA: Diagnosis not present

## 2021-07-06 ENCOUNTER — Other Ambulatory Visit: Payer: Self-pay | Admitting: Neurosurgery

## 2021-07-26 DIAGNOSIS — M4316 Spondylolisthesis, lumbar region: Secondary | ICD-10-CM | POA: Diagnosis not present

## 2021-08-01 ENCOUNTER — Encounter: Payer: Self-pay | Admitting: Family Medicine

## 2021-08-05 NOTE — Pre-Procedure Instructions (Signed)
Surgical Instructions    Your procedure is scheduled on Wednesday 08/10/21.   Report to [facility] Main Entrance "A" at 09:40 A.M., then check in with the Admitting office.  Call this number if you have problems the morning of surgery:  [phone]   If you have any questions prior to your surgery date call [phone]: Open Monday-Friday 8am-4pm    Remember:  Do not eat or drink after midnight the night before your surgery   Take these medicines the morning of surgery with A SIP OF WATER   amLODipine (NORVASC)  triamcinolone (NASACORT)    As of today, STOP taking any Aspirin (unless otherwise instructed by your surgeon) Aleve, Naproxen, Ibuprofen, Motrin, Advil, Goody's, BC's, all herbal medications, fish oil, and all vitamins.          Do not wear jewelry or makeup Do not wear lotions, powders, perfumes/colognes, or deodorant. Do not shave 48 hours prior to surgery.  Men may shave face and neck. Do not bring valuables to the hospital. DO Not wear nail polish, gel polish, artificial nails, or any other type of covering on natural nails including finger and toenails. If patients have artificial nails, gel coating, etc. that need to be removed by a nail salon please have this removed prior to surgery or surgery may need to be canceled/delayed if the surgeon/ anesthesia feels like the patient is unable to be adequately monitored.             [facility] is not responsible for any belongings or valuables.  Do NOT Smoke (Tobacco/Vaping)  24 hours prior to your procedure If you use a CPAP at night, you may bring your mask for your overnight stay.   Contacts, glasses, dentures or bridgework may not be worn into surgery, please bring cases for these belongings   For patients admitted to the hospital, discharge time will be determined by your treatment team.   Patients discharged the day of surgery will not be allowed to drive home, and someone needs to stay with them for 24  hours.  NO VISITORS WILL BE ALLOWED IN PRE-OP WHERE PATIENTS GET READY FOR SURGERY.  ONLY 1 SUPPORT PERSON MAY BE PRESENT WHILE YOU ARE IN SURGERY.  IF YOU ARE TO BE ADMITTED, ONCE YOU ARE IN YOUR ROOM YOU WILL BE ALLOWED TWO (2) VISITORS.  Minor children may have two parents present. Special consideration for safety and communication needs will be reviewed on a case by case basis.  Special instructions:    Oral Hygiene is also important to reduce your risk of infection.  Remember - BRUSH YOUR TEETH THE MORNING OF SURGERY WITH YOUR REGULAR TOOTHPASTE   [facility]- Preparing For Surgery  Before surgery, you can play an important role. Because skin is not sterile, your skin needs to be as free of germs as possible. You can reduce the number of germs on your skin by washing with CHG (chlorahexidine gluconate) Soap before surgery.  CHG is an antiseptic cleaner which kills germs and bonds with the skin to continue killing germs even after washing.     Please do not use if you have an allergy to CHG or antibacterial soaps. If your skin becomes reddened/irritated stop using the CHG.  Do not shave (including legs and underarms) for at least 48 hours prior to first CHG shower. It is OK to shave your face.  Please follow these instructions carefully.     Shower the NIGHT BEFORE SURGERY and the MORNING OF  SURGERY with CHG Soap.   If you chose to wash your hair, wash your hair first as usual with your normal shampoo. After you shampoo, rinse your hair and body thoroughly to remove the shampoo.  Then [employer] and genitals (private parts) with your normal soap and rinse thoroughly to remove soap.  After that Use CHG Soap as you would any other liquid soap. You can apply CHG directly to the skin and wash gently with a scrungie or a clean washcloth.   Apply the CHG Soap to your body ONLY FROM THE NECK DOWN.  Do not use on open wounds or open sores. Avoid contact with your eyes, ears, mouth and genitals  (private parts). Wash Face and genitals (private parts)  with your normal soap.   Wash thoroughly, paying special attention to the area where your surgery will be performed.  Thoroughly rinse your body with warm water from the neck down.  DO NOT shower/wash with your normal soap after using and rinsing off the CHG Soap.  Pat yourself dry with a CLEAN TOWEL.  Wear CLEAN PAJAMAS to bed the night before surgery  Place CLEAN SHEETS on your bed the night before your surgery  DO NOT SLEEP WITH PETS.   Day of Surgery:  Take a shower with CHG soap. Wear Clean/Comfortable clothing the morning of surgery Do not apply any deodorants/lotions.   Remember to brush your teeth WITH YOUR REGULAR TOOTHPASTE.   Please read over the following fact sheets that you were given.

## 2021-08-08 ENCOUNTER — Other Ambulatory Visit: Payer: Self-pay

## 2021-08-08 ENCOUNTER — Encounter (HOSPITAL_COMMUNITY): Payer: Self-pay

## 2021-08-08 ENCOUNTER — Encounter (HOSPITAL_COMMUNITY)
Admission: RE | Admit: 2021-08-08 | Discharge: 2021-08-08 | Disposition: A | Discharge status: Home / Self Care | Payer: Medicare Other | Source: Ambulatory Visit | Attending: Neurosurgery | Admitting: Neurosurgery

## 2021-08-08 DIAGNOSIS — Z20822 Contact with and (suspected) exposure to covid-19: Secondary | ICD-10-CM | POA: Diagnosis not present

## 2021-08-08 DIAGNOSIS — Z01818 Encounter for other preprocedural examination: Secondary | ICD-10-CM | POA: Insufficient documentation

## 2021-08-08 HISTORY — DX: Headache, unspecified: R51.9

## 2021-08-08 LAB — TYPE AND SCREEN
ABO/RH(D): A NEG
Antibody Screen: NEGATIVE

## 2021-08-08 LAB — CBC
HCT: 46.1 % — ABNORMAL HIGH (ref 36.0–46.0)
Hemoglobin: 15.3 g/dL — ABNORMAL HIGH (ref 12.0–15.0)
MCH: 30.5 pg (ref 26.0–34.0)
MCHC: 33.2 g/dL (ref 30.0–36.0)
MCV: 92 fL (ref 80.0–100.0)
Platelets: 315 10*3/uL (ref 150–400)
RBC: 5.01 MIL/uL (ref 3.87–5.11)
RDW: 12.8 % (ref 11.5–15.5)
WBC: 7.8 10*3/uL (ref 4.0–10.5)
nRBC: 0 % (ref 0.0–0.2)

## 2021-08-08 LAB — BASIC METABOLIC PANEL
Anion gap: 8 (ref 5–15)
BUN: 14 mg/dL (ref 8–23)
CO2: 28 mmol/L (ref 22–32)
Calcium: 9.3 mg/dL (ref 8.9–10.3)
Chloride: 101 mmol/L (ref 98–111)
Creatinine, Ser: 0.52 mg/dL (ref 0.44–1.00)
GFR, Estimated: >60 mL/min (ref >60)
Glucose, Bld: 101 mg/dL — ABNORMAL HIGH (ref 70–99)
Potassium: 4.3 mmol/L (ref 3.5–5.1)
Sodium: 137 mmol/L (ref 135–145)

## 2021-08-08 LAB — SARS CORONAVIRUS 2 (TAT 6-24 HRS): SARS Coronavirus 2: NEGATIVE

## 2021-08-08 LAB — SURGICAL PCR SCREEN
MRSA, PCR: NEGATIVE
Staphylococcus aureus: POSITIVE — AB

## 2021-08-08 NOTE — Progress Notes (Addendum)
PCP - Dr. Warner Mccreedy Cardiologist - denies  PPM/ICD - n/a Device Orders - n/a Rep Notified - n/a  Chest x-ray - n/a EKG - 08/08/21 Stress Test - denies ECHO - 08/09/16 Cardiac Cath - denies  Sleep Study - denies CPAP - n/a  Fasting Blood Sugar - n/a Checks Blood Sugar __  n/a ___ times a day  Blood Thinner Instructions: n/a Aspirin Instructions: n/a  ERAS Protcol - n/a PRE-SURGERY Ensure or G2- n/a  COVID TEST- 08/08/21. Pending   Anesthesia review: Yes. EKG Review. Spoke to [employer], [place].   Patient denies shortness of breath, fever, cough and chest pain at PAT appointment   All instructions explained to the patient, with a verbal understanding of the material. Patient agrees to go over the instructions while at home for a better understanding. Patient also instructed to self quarantine after being tested for COVID-19. The opportunity to ask questions was provided.

## 2021-08-10 ENCOUNTER — Ambulatory Visit (HOSPITAL_COMMUNITY): Payer: Medicare Other | Admitting: Anesthesiology

## 2021-08-10 ENCOUNTER — Ambulatory Visit (HOSPITAL_COMMUNITY): Payer: Medicare Other | Admitting: Physician Assistant

## 2021-08-10 ENCOUNTER — Ambulatory Visit (HOSPITAL_COMMUNITY): Payer: Medicare Other

## 2021-08-10 ENCOUNTER — Encounter (HOSPITAL_COMMUNITY): Payer: Self-pay | Admitting: Neurosurgery

## 2021-08-10 ENCOUNTER — Other Ambulatory Visit: Payer: Self-pay

## 2021-08-10 ENCOUNTER — Encounter (HOSPITAL_COMMUNITY)
Admission: RE | Disposition: A | Discharge status: Home / Self Care | Payer: Self-pay | Source: Home / Self Care | Attending: Neurosurgery

## 2021-08-10 ENCOUNTER — Ambulatory Visit (HOSPITAL_COMMUNITY)
Admission: RE | Admit: 2021-08-10 | Discharge: 2021-08-11 | Disposition: A | Discharge status: Home / Self Care | Payer: Medicare Other | Attending: Neurosurgery | Admitting: Neurosurgery

## 2021-08-10 DIAGNOSIS — Z87891 Personal history of nicotine dependence: Secondary | ICD-10-CM | POA: Insufficient documentation

## 2021-08-10 DIAGNOSIS — M48061 Spinal stenosis, lumbar region without neurogenic claudication: Secondary | ICD-10-CM | POA: Insufficient documentation

## 2021-08-10 DIAGNOSIS — M4326 Fusion of spine, lumbar region: Secondary | ICD-10-CM | POA: Diagnosis not present

## 2021-08-10 DIAGNOSIS — Z419 Encounter for procedure for purposes other than remedying health state, unspecified: Secondary | ICD-10-CM

## 2021-08-10 DIAGNOSIS — M4316 Spondylolisthesis, lumbar region: Secondary | ICD-10-CM | POA: Insufficient documentation

## 2021-08-10 DIAGNOSIS — Z79899 Other long term (current) drug therapy: Secondary | ICD-10-CM | POA: Insufficient documentation

## 2021-08-10 DIAGNOSIS — Z981 Arthrodesis status: Secondary | ICD-10-CM | POA: Diagnosis not present

## 2021-08-10 DIAGNOSIS — Z79890 Hormone replacement therapy (postmenopausal): Secondary | ICD-10-CM | POA: Insufficient documentation

## 2021-08-10 DIAGNOSIS — I10 Essential (primary) hypertension: Secondary | ICD-10-CM | POA: Diagnosis not present

## 2021-08-10 DIAGNOSIS — M8580 Other specified disorders of bone density and structure, unspecified site: Secondary | ICD-10-CM | POA: Diagnosis not present

## 2021-08-10 LAB — ABO/RH: ABO/RH(D): A NEG

## 2021-08-10 SURGERY — POSTERIOR LUMBAR FUSION 1 LEVEL
Anesthesia: General | Site: Spine Lumbar

## 2021-08-10 MED ORDER — PHENYLEPHRINE 40 MCG/ML (10ML) SYRINGE FOR IV PUSH (FOR BLOOD PRESSURE SUPPORT)
PREFILLED_SYRINGE | INTRAVENOUS | Status: DC | PRN
  Administered 2021-08-10: 160 ug via INTRAVENOUS

## 2021-08-10 MED ORDER — CHLORHEXIDINE GLUCONATE CLOTH 2 % EX PADS: 6.0000 | MEDICATED_PAD | Freq: Once | CUTANEOUS | Status: DC

## 2021-08-10 MED ORDER — LIDOCAINE 2% (20 MG/ML) 5 ML SYRINGE
INTRAMUSCULAR | Status: DC | PRN
  Administered 2021-08-10: 80 mg via INTRAVENOUS

## 2021-08-10 MED ORDER — LIDOCAINE HCL (PF) 2 % IJ SOLN
INTRAMUSCULAR | Status: AC
  Filled 2021-08-10: qty 5

## 2021-08-10 MED ORDER — ONDANSETRON HCL 4 MG/2ML IJ SOLN
INTRAMUSCULAR | Status: DC | PRN
  Administered 2021-08-10: 4 mg via INTRAVENOUS

## 2021-08-10 MED ORDER — PANTOPRAZOLE SODIUM 40 MG IV SOLR: 40.0000 mg | Freq: Every day | INTRAVENOUS | Status: DC

## 2021-08-10 MED ORDER — SODIUM CHLORIDE 0.9% FLUSH: 3.0000 mL | Freq: Two times a day (BID) | INTRAVENOUS | Status: DC

## 2021-08-10 MED ORDER — ONDANSETRON HCL 4 MG/2ML IJ SOLN
4.0000 mg | Freq: Four times a day (QID) | INTRAMUSCULAR | Status: DC | PRN
Start: 2021-08-10 — End: 2021-08-11

## 2021-08-10 MED ORDER — LIDOCAINE-EPINEPHRINE 1 %-1:100000 IJ SOLN
INTRAMUSCULAR | Status: DC | PRN
  Administered 2021-08-10: 10 mL

## 2021-08-10 MED ORDER — PHENOL 1.4 % MT LIQD: 1.0000 | OROMUCOSAL | Status: DC | PRN

## 2021-08-10 MED ORDER — ROCURONIUM BROMIDE 10 MG/ML (PF) SYRINGE
PREFILLED_SYRINGE | INTRAVENOUS | Status: DC | PRN
  Administered 2021-08-10: 20 mg via INTRAVENOUS
  Administered 2021-08-10: 100 mg via INTRAVENOUS

## 2021-08-10 MED ORDER — THROMBIN 20000 UNITS EX SOLR
CUTANEOUS | Status: AC
  Filled 2021-08-10: qty 20000

## 2021-08-10 MED ORDER — FENTANYL CITRATE (PF) 100 MCG/2ML IJ SOLN
25.0000 ug | INTRAMUSCULAR | Status: DC | PRN
  Administered 2021-08-10: 25 ug via INTRAVENOUS

## 2021-08-10 MED ORDER — SODIUM CHLORIDE (PF) 0.9 % IJ SOLN
INTRAMUSCULAR | Status: AC
  Filled 2021-08-10: qty 10

## 2021-08-10 MED ORDER — BENZONATATE 100 MG PO CAPS: 100.0000 mg | ORAL_CAPSULE | Freq: Three times a day (TID) | ORAL | Status: DC | PRN

## 2021-08-10 MED ORDER — CYCLOBENZAPRINE HCL 10 MG PO TABS
10.0000 mg | ORAL_TABLET | Freq: Three times a day (TID) | ORAL | Status: DC | PRN
  Administered 2021-08-10: 10 mg via ORAL
  Filled 2021-08-10: qty 1

## 2021-08-10 MED ORDER — LIDOCAINE-EPINEPHRINE 2 %-1:100000 IJ SOLN
INTRAMUSCULAR | Status: AC
  Filled 2021-08-10: qty 1

## 2021-08-10 MED ORDER — LACTATED RINGERS IV SOLN: INTRAVENOUS | Status: DC

## 2021-08-10 MED ORDER — PHENYLEPHRINE 40 MCG/ML (10ML) SYRINGE FOR IV PUSH (FOR BLOOD PRESSURE SUPPORT)
PREFILLED_SYRINGE | INTRAVENOUS | Status: AC
  Filled 2021-08-10: qty 10

## 2021-08-10 MED ORDER — MENTHOL 3 MG MT LOZG: 1.0000 | LOZENGE | OROMUCOSAL | Status: DC | PRN

## 2021-08-10 MED ORDER — ACETAMINOPHEN 325 MG PO TABS
650.0000 mg | ORAL_TABLET | ORAL | Status: DC | PRN
  Administered 2021-08-10 – 2021-08-11 (×3): 650 mg via ORAL
  Filled 2021-08-10 (×3): qty 2

## 2021-08-10 MED ORDER — AMLODIPINE BESYLATE 5 MG PO TABS
5.0000 mg | ORAL_TABLET | Freq: Every day | ORAL | Status: DC
  Administered 2021-08-11: 5 mg via ORAL
  Filled 2021-08-10: qty 1

## 2021-08-10 MED ORDER — THROMBIN 5000 UNITS EX SOLR: OROMUCOSAL | Status: DC | PRN

## 2021-08-10 MED ORDER — FLUTICASONE PROPIONATE 50 MCG/ACT NA SUSP: 2.0000 | Freq: Every day | NASAL | Status: DC

## 2021-08-10 MED ORDER — MIDAZOLAM HCL 2 MG/2ML IJ SOLN
INTRAMUSCULAR | Status: AC
  Filled 2021-08-10: qty 2

## 2021-08-10 MED ORDER — SUGAMMADEX SODIUM 200 MG/2ML IV SOLN
INTRAVENOUS | Status: DC | PRN
  Administered 2021-08-10: 350 mg via INTRAVENOUS

## 2021-08-10 MED ORDER — SODIUM CHLORIDE 0.9% FLUSH: 3.0000 mL | INTRAVENOUS | Status: DC | PRN

## 2021-08-10 MED ORDER — SODIUM CHLORIDE 0.9 % IV SOLN
250.0000 mL | INTRAVENOUS | Status: DC
  Administered 2021-08-10: 250 mL via INTRAVENOUS

## 2021-08-10 MED ORDER — FENTANYL CITRATE (PF) 100 MCG/2ML IJ SOLN
INTRAMUSCULAR | Status: AC
  Filled 2021-08-10: qty 2

## 2021-08-10 MED ORDER — SUFENTANIL CITRATE 50 MCG/ML IV SOLN
INTRAVENOUS | Status: AC
  Filled 2021-08-10: qty 1

## 2021-08-10 MED ORDER — ACETAMINOPHEN 650 MG RE SUPP: 650.0000 mg | RECTAL | Status: DC | PRN

## 2021-08-10 MED ORDER — HYDROCHLOROTHIAZIDE 25 MG PO TABS
25.0000 mg | ORAL_TABLET | Freq: Every day | ORAL | Status: DC
  Administered 2021-08-10 – 2021-08-11 (×2): 25 mg via ORAL
  Filled 2021-08-10 (×2): qty 1

## 2021-08-10 MED ORDER — CEFAZOLIN SODIUM-DEXTROSE 2-4 GM/100ML-% IV SOLN
2.0000 g | INTRAVENOUS | Status: DC
  Filled 2021-08-10: qty 100

## 2021-08-10 MED ORDER — PANTOPRAZOLE SODIUM 40 MG PO TBEC
40.0000 mg | DELAYED_RELEASE_TABLET | Freq: Every day | ORAL | Status: DC
  Administered 2021-08-10: 40 mg via ORAL
  Filled 2021-08-10: qty 1

## 2021-08-10 MED ORDER — HYDROMORPHONE HCL 1 MG/ML IJ SOLN: 0.5000 mg | INTRAMUSCULAR | Status: DC | PRN

## 2021-08-10 MED ORDER — ONDANSETRON HCL 4 MG PO TABS
4.0000 mg | ORAL_TABLET | Freq: Four times a day (QID) | ORAL | Status: DC | PRN
Start: 2021-08-10 — End: 2021-08-11

## 2021-08-10 MED ORDER — AMISULPRIDE (ANTIEMETIC) 5 MG/2ML IV SOLN
10.0000 mg | Freq: Once | INTRAVENOUS | Status: AC
  Administered 2021-08-10: 10 mg via INTRAVENOUS

## 2021-08-10 MED ORDER — PROPOFOL 10 MG/ML IV BOLUS
INTRAVENOUS | Status: AC
  Filled 2021-08-10: qty 20

## 2021-08-10 MED ORDER — TRIAMCINOLONE ACETONIDE 55 MCG/ACT NA AERO
2.0000 | INHALATION_SPRAY | Freq: Every day | NASAL | Status: DC
  Filled 2021-08-10: qty 21.6

## 2021-08-10 MED ORDER — ROCURONIUM BROMIDE 10 MG/ML (PF) SYRINGE
PREFILLED_SYRINGE | INTRAVENOUS | Status: AC
  Filled 2021-08-10: qty 10

## 2021-08-10 MED ORDER — 0.9 % SODIUM CHLORIDE (POUR BTL) OPTIME
TOPICAL | Status: DC | PRN
  Administered 2021-08-10: 1000 mL

## 2021-08-10 MED ORDER — CHLORHEXIDINE GLUCONATE 0.12 % MT SOLN
15.0000 mL | Freq: Once | OROMUCOSAL | Status: AC
  Administered 2021-08-10: 15 mL via OROMUCOSAL
  Filled 2021-08-10: qty 15

## 2021-08-10 MED ORDER — SCOPOLAMINE 1 MG/3DAYS TD PT72
MEDICATED_PATCH | TRANSDERMAL | Status: AC
  Administered 2021-08-10: 1.5 mg via TRANSDERMAL
  Filled 2021-08-10: qty 1

## 2021-08-10 MED ORDER — LACTATED RINGERS IV SOLN: INTRAVENOUS | Status: DC | PRN

## 2021-08-10 MED ORDER — PHENYLEPHRINE HCL-NACL 20-0.9 MG/250ML-% IV SOLN
INTRAVENOUS | Status: DC | PRN
  Administered 2021-08-10: 50 ug/min via INTRAVENOUS

## 2021-08-10 MED ORDER — MIDAZOLAM HCL 2 MG/2ML IJ SOLN
INTRAMUSCULAR | Status: DC | PRN
  Administered 2021-08-10: 2 mg via INTRAVENOUS

## 2021-08-10 MED ORDER — SCOPOLAMINE 1 MG/3DAYS TD PT72: 1.0000 | MEDICATED_PATCH | TRANSDERMAL | Status: DC

## 2021-08-10 MED ORDER — AMISULPRIDE (ANTIEMETIC) 5 MG/2ML IV SOLN
INTRAVENOUS | Status: AC
  Filled 2021-08-10: qty 4

## 2021-08-10 MED ORDER — SUFENTANIL CITRATE 50 MCG/ML IV SOLN
INTRAVENOUS | Status: DC | PRN
  Administered 2021-08-10: 10 ug via INTRAVENOUS
  Administered 2021-08-10: 5 ug via INTRAVENOUS
  Administered 2021-08-10: 10 ug via INTRAVENOUS
  Administered 2021-08-10: 5 ug via INTRAVENOUS

## 2021-08-10 MED ORDER — LISINOPRIL-HYDROCHLOROTHIAZIDE 20-12.5 MG PO TABS: 2.0000 | ORAL_TABLET | Freq: Every day | ORAL | Status: DC

## 2021-08-10 MED ORDER — THROMBIN 20000 UNITS EX SOLR: CUTANEOUS | Status: DC | PRN

## 2021-08-10 MED ORDER — DEXAMETHASONE SODIUM PHOSPHATE 10 MG/ML IJ SOLN
10.0000 mg | Freq: Once | INTRAMUSCULAR | Status: DC
  Filled 2021-08-10: qty 1

## 2021-08-10 MED ORDER — ACETAMINOPHEN 500 MG PO TABS
1000.0000 mg | ORAL_TABLET | Freq: Once | ORAL | Status: AC
  Administered 2021-08-10: 1000 mg via ORAL
  Filled 2021-08-10: qty 2

## 2021-08-10 MED ORDER — BUPIVACAINE LIPOSOME 1.3 % IJ SUSP
INTRAMUSCULAR | Status: AC
  Filled 2021-08-10: qty 20

## 2021-08-10 MED ORDER — ONDANSETRON HCL 4 MG/2ML IJ SOLN
INTRAMUSCULAR | Status: AC
  Filled 2021-08-10: qty 2

## 2021-08-10 MED ORDER — OXYCODONE HCL 5 MG PO TABS
10.0000 mg | ORAL_TABLET | ORAL | Status: DC | PRN
  Administered 2021-08-10 – 2021-08-11 (×4): 10 mg via ORAL
  Filled 2021-08-10 (×4): qty 2

## 2021-08-10 MED ORDER — FENTANYL CITRATE (PF) 250 MCG/5ML IJ SOLN: INTRAMUSCULAR | Status: DC | PRN

## 2021-08-10 MED ORDER — LISINOPRIL 20 MG PO TABS
40.0000 mg | ORAL_TABLET | Freq: Every day | ORAL | Status: DC
  Administered 2021-08-10 – 2021-08-11 (×2): 40 mg via ORAL
  Filled 2021-08-10 (×2): qty 2

## 2021-08-10 MED ORDER — ALUM & MAG HYDROXIDE-SIMETH 200-200-20 MG/5ML PO SUSP: 30.0000 mL | Freq: Four times a day (QID) | ORAL | Status: DC | PRN

## 2021-08-10 MED ORDER — PROPOFOL 10 MG/ML IV BOLUS
INTRAVENOUS | Status: DC | PRN
Start: 2021-08-10 — End: 2021-08-10
  Administered 2021-08-10: 130 mg via INTRAVENOUS

## 2021-08-10 MED ORDER — CEFAZOLIN SODIUM-DEXTROSE 2-4 GM/100ML-% IV SOLN
2.0000 g | Freq: Three times a day (TID) | INTRAVENOUS | Status: AC
Start: 2021-08-10 — End: 2021-08-11
  Administered 2021-08-10 – 2021-08-11 (×2): 2 g via INTRAVENOUS
  Filled 2021-08-10 (×2): qty 100

## 2021-08-10 MED ORDER — BUPIVACAINE LIPOSOME 1.3 % IJ SUSP
INTRAMUSCULAR | Status: DC | PRN
  Administered 2021-08-10: 20 mL

## 2021-08-10 MED ORDER — ESTRADIOL 0.1 MG/GM VA CREA
1.0000 | TOPICAL_CREAM | VAGINAL | Status: DC
  Administered 2021-08-11: 1 via VAGINAL
  Filled 2021-08-10: qty 42.5

## 2021-08-10 MED ORDER — ORAL CARE MOUTH RINSE: 15.0000 mL | Freq: Once | OROMUCOSAL | Status: AC

## 2021-08-10 SURGICAL SUPPLY — 73 items
BAG COUNTER SPONGE SURGICOUNT (BAG) ×2 IMPLANT
BASKET BONE COLLECTION (BASKET) ×2 IMPLANT
BENZOIN TINCTURE PRP APPL 2/3 (GAUZE/BANDAGES/DRESSINGS) ×2 IMPLANT
BIT DRILL 5.0/4.0 (BIT) ×1 IMPLANT
BLADE CLIPPER SURG (BLADE) IMPLANT
BLADE SURG 11 STRL SS (BLADE) ×2 IMPLANT
BONE VIVIGEN FORMABLE 5.4CC (Bone Implant) ×2 IMPLANT
BUR CUTTER 7.0 ROUND (BURR) ×2 IMPLANT
BUR MATCHSTICK NEURO 3.0 LAGG (BURR) ×2 IMPLANT
CANISTER SUCT 3000ML PPV (MISCELLANEOUS) ×2 IMPLANT
CAP LOCKING THREADED (Cap) ×8 IMPLANT
CARTRIDGE OIL MAESTRO DRILL (MISCELLANEOUS) ×1 IMPLANT
CNTNR URN SCR LID CUP LEK RST (MISCELLANEOUS) ×1 IMPLANT
CONT SPEC 4OZ STRL OR WHT (MISCELLANEOUS) ×2
COVER BACK TABLE 60X90IN (DRAPES) ×2 IMPLANT
DECANTER SPIKE VIAL GLASS SM (MISCELLANEOUS) IMPLANT
DERMABOND ADVANCED (GAUZE/BANDAGES/DRESSINGS) ×1
DERMABOND ADVANCED .7 DNX12 (GAUZE/BANDAGES/DRESSINGS) ×1 IMPLANT
DIFFUSER DRILL AIR PNEUMATIC (MISCELLANEOUS) ×2 IMPLANT
DRAPE C-ARM 42X72 X-RAY (DRAPES) ×4 IMPLANT
DRAPE C-ARMOR (DRAPES) IMPLANT
DRAPE HALF SHEET 40X57 (DRAPES) IMPLANT
DRAPE LAPAROTOMY 100X72X124 (DRAPES) ×2 IMPLANT
DRAPE SURG 17X23 STRL (DRAPES) ×2 IMPLANT
DRILL 5.0/4.0 (BIT) ×2
DRSG OPSITE 4X5.5 SM (GAUZE/BANDAGES/DRESSINGS) ×2 IMPLANT
DRSG OPSITE POSTOP 4X6 (GAUZE/BANDAGES/DRESSINGS) ×2 IMPLANT
DRSG OPSITE POSTOP 4X8 (GAUZE/BANDAGES/DRESSINGS) ×2 IMPLANT
DURAPREP 26ML APPLICATOR (WOUND CARE) ×2 IMPLANT
ELECT REM PT RETURN 9FT ADLT (ELECTROSURGICAL) ×2
ELECTRODE REM PT RTRN 9FT ADLT (ELECTROSURGICAL) ×1 IMPLANT
EVACUATOR 1/8 PVC DRAIN (DRAIN) ×2 IMPLANT
EVACUATOR 3/16  PVC DRAIN (DRAIN)
EVACUATOR 3/16 PVC DRAIN (DRAIN) IMPLANT
GAUZE 4X4 16PLY ~~LOC~~+RFID DBL (SPONGE) IMPLANT
GAUZE SPONGE 4X4 12PLY STRL (GAUZE/BANDAGES/DRESSINGS) ×2 IMPLANT
GAUZE SPONGE 4X4 12PLY STRL LF (GAUZE/BANDAGES/DRESSINGS) ×2 IMPLANT
GLOVE EXAM NITRILE XL STR (GLOVE) IMPLANT
GLOVE SURG ENC MOIS LTX SZ7 (GLOVE) IMPLANT
GLOVE SURG ENC MOIS LTX SZ8 (GLOVE) ×4 IMPLANT
GLOVE SURG LTX SZ7 (GLOVE) ×4 IMPLANT
GLOVE SURG UNDER LTX SZ8.5 (GLOVE) ×4 IMPLANT
GLOVE SURG UNDER POLY LF SZ7 (GLOVE) IMPLANT
GLOVE SURG UNDER POLY LF SZ7.5 (GLOVE) ×8 IMPLANT
GOWN STRL REUS W/ TWL LRG LVL3 (GOWN DISPOSABLE) IMPLANT
GOWN STRL REUS W/ TWL XL LVL3 (GOWN DISPOSABLE) ×2 IMPLANT
GOWN STRL REUS W/TWL 2XL LVL3 (GOWN DISPOSABLE) IMPLANT
GOWN STRL REUS W/TWL LRG LVL3 (GOWN DISPOSABLE)
GOWN STRL REUS W/TWL XL LVL3 (GOWN DISPOSABLE) ×4
GRAFT BONE PROTEIOS LRG 5CC (Orthopedic Implant) ×2 IMPLANT
KIT BASIN OR (CUSTOM PROCEDURE TRAY) ×2 IMPLANT
KIT TURNOVER KIT B (KITS) ×2 IMPLANT
MILL MEDIUM DISP (BLADE) ×2 IMPLANT
NEEDLE HYPO 25X1 1.5 SAFETY (NEEDLE) ×2 IMPLANT
NS IRRIG 1000ML POUR BTL (IV SOLUTION) ×2 IMPLANT
OIL CARTRIDGE MAESTRO DRILL (MISCELLANEOUS) ×2
PACK LAMINECTOMY NEURO (CUSTOM PROCEDURE TRAY) ×2 IMPLANT
PAD ARMBOARD 7.5X6 YLW CONV (MISCELLANEOUS) ×12 IMPLANT
ROD 40MM SPINAL (Rod) ×4 IMPLANT
SCREW PA THRD CREO TULIP 5.5X4 (Head) ×8 IMPLANT
SHAFT CREO 30MM (Neuro Prosthesis/Implant) ×8 IMPLANT
SPACER TITANIUM 8X26 11 13 (Spacer) ×4 IMPLANT
SPONGE SURGIFOAM ABS GEL 100 (HEMOSTASIS) ×2 IMPLANT
SPONGE T-LAP 4X18 ~~LOC~~+RFID (SPONGE) IMPLANT
STRIP CLOSURE SKIN 1/2X4 (GAUZE/BANDAGES/DRESSINGS) ×4 IMPLANT
SUT VIC AB 0 CT1 18XCR BRD8 (SUTURE) ×2 IMPLANT
SUT VIC AB 0 CT1 8-18 (SUTURE) ×4
SUT VIC AB 2-0 CT1 18 (SUTURE) ×2 IMPLANT
SUT VIC AB 4-0 PS2 27 (SUTURE) ×2 IMPLANT
TOWEL GREEN STERILE (TOWEL DISPOSABLE) ×2 IMPLANT
TOWEL GREEN STERILE FF (TOWEL DISPOSABLE) ×2 IMPLANT
TRAY FOLEY MTR SLVR 16FR STAT (SET/KITS/TRAYS/PACK) ×2 IMPLANT
WATER STERILE IRR 1000ML POUR (IV SOLUTION) ×2 IMPLANT

## 2021-08-10 NOTE — Transfer of Care (Signed)
Immediate Anesthesia Transfer of Care Note  Patient: Jeanette Mckee  Procedure(s) Performed: Posterior Lumbar Interbody Fusion  - Lumbar four-Lumbar five - Interbody Fusion (Spine Lumbar)  Patient Location: PACU  Anesthesia Type:General  Level of Consciousness: drowsy  Airway & Oxygen Therapy: Patient Spontanous Breathing  Post-op Assessment: Report given to RN and Post -op Vital signs reviewed and stable  Post vital signs: Reviewed and stable  Last Vitals:  Vitals Value Taken Time  BP 104/87 08/10/21 1550  Temp    Pulse 76 08/10/21 1554  Resp 11 08/10/21 1554  SpO2 96 % 08/10/21 1554  Vitals shown include unvalidated device data.  Last Pain:  Vitals:   08/10/21 1003  PainSc: 0-No pain      Patients Stated Pain Goal: 0 (08/10/21 1003)  Complications: No notable events documented.

## 2021-08-10 NOTE — Anesthesia Preprocedure Evaluation (Addendum)
Anesthesia Evaluation  Patient identified by MRN, date of birth, ID band Patient awake    Reviewed: Allergy & Precautions, NPO status , Patient's Chart, lab work & pertinent test results  Airway Mallampati: II  TM Distance: >3 FB Neck ROM: Full    Dental  (+) Edentulous Upper, Edentulous Lower, Lower Dentures, Upper Dentures   Pulmonary neg pulmonary ROS, former smoker,    Pulmonary exam normal breath sounds clear to auscultation       Cardiovascular hypertension, Pt. on medications Normal cardiovascular exam Rhythm:Regular Rate:Normal  HLD  TTE 2017 - Left ventricle: The cavity size was normal. Systolic function was  normal. The estimated ejection fraction was in the range of 55%  to 60%. Wall motion was normal; there were no regional wall  motion abnormalities. Left ventricular diastolic function  parameters were normal.  - Left atrium: The atrium was mildly dilated.  - Atrial septum: No defect or patent foramen ovale was identified.    Neuro/Psych  Headaches, negative psych ROS   GI/Hepatic negative GI ROS, Neg liver ROS,   Endo/Other  negative endocrine ROS  Renal/GU negative Renal ROS  negative genitourinary   Musculoskeletal negative musculoskeletal ROS (+)   Abdominal   Peds  Hematology negative hematology ROS (+)   Anesthesia Other Findings   Reproductive/Obstetrics                            Anesthesia Physical Anesthesia Plan  ASA: 2  Anesthesia Plan: General   Post-op Pain Management:    Induction: Intravenous  PONV Risk Score and Plan: 3 and Midazolam, Dexamethasone and Ondansetron  Airway Management Planned: Oral ETT  Additional Equipment:   Intra-op Plan:   Post-operative Plan: Extubation in OR  Informed Consent: I have reviewed the patients History and Physical, chart, labs and discussed the procedure including the risks, benefits and alternatives  for the proposed anesthesia with the patient or authorized representative who has indicated his/her understanding and acceptance.     Dental advisory given  Plan Discussed with: CRNA  Anesthesia Plan Comments:        Anesthesia Quick Evaluation

## 2021-08-10 NOTE — Op Note (Signed)
Preoperative diagnosis: Grade 1 spondylolisthesis severe lumbar spinal stenosis L4-5  Postoperative diagnosis: Same  Procedure: Decompressive lumbar laminectomy with complete medial facetectomies and radical foraminotomies of the L4 and L5 nerve root in excess and requiring more work than would be needed with a standard interbody fusion.  2.  Posterior lumbar interbody fusion L4-5 utilizing globus titanium insert and rotate cages packed with locally harvested autograft mixed with vivigen and partialis  3.  Cortical screw fixation L4-5 utilizing the globus Creo amp modular cortical screw set  Surgeon: Jillyn Hidden Dedrick Heffner  Assistant: Julien Girt  Anesthesia: General  EBL: Minimal  HPI: 70 year old female progressive worsening back pain bilateral leg pain numbness and tingling work-up revealed severe spinal stenosis at L4-5.  Due to patient progression of clinical syndrome imaging findings and failed conservative treatment I recommended decompressive laminectomy and interbody fusion at that level.  I extensively went over the risks and benefits of the operation with her as well as perioperative course expectations of outcome and alternatives to surgery and she understood and agreed to proceed forward.  Operative procedure: Patient was brought into the OR was induced under general anesthesia positioned prone the Wilson frame her back was prepped and draped in routine sterile fashion.  Preoperative x-ray localized the appropriate level.  So after infiltration of 10 cc lidocaine with epi a midline incision was made and Bovie electrocautery was used to take down the subcutaneous tissue and subperiosteal dissection was carried out on the lamina of L4 and L5.  Intraoperative x-ray confirmed identification appropriate level.  The spinous process at L4 was then removed the facet joints were drilled down central decompression was begun.  There was marked dystrophic calcification and degeneration of the facet joints  causing severe hourglass compression of thecal sac this was all removed complete medial facetectomies were performed bilaterally I then aggressively blew through the pars and under bit the supra reticulating facet to gain access to the lateral margin of the disc base and adequately decompress the L4 nerve root.  Then disc base was coagulated the space was incised and cleaned out bilaterally and with sequential distractors in place I have performed an aggressive discectomy and endplate preparation.  I selected 8 mm wide 12 mm 15 degree lordotic implants and inserted them under fluoroscopy.  These and all been packed with the locally harvested autograft mixed which was also packed centrally.  Then all 4 cortical screws were placed all screws had excellent purchase fluoroscopy confirmed good position of all the implants.  Then I assembled the heads copiously irrigated the wound selected up the rods tightened the L5 screw down compressed L4 against L5 and anchored that in place inspected all the foramina to confirm patency.  Gelfoam in the dura injected Exparel in the fascia placed a medium Hemovac drain and then closed the wound in layers with interrupted Vicryl and a running 4 subcuticular.  Dermabond benzoin Steri-Strips and a sterile dressing was applied and patient recovery in stable condition.  At the end the case all needle counts and sponge counts were correct.

## 2021-08-10 NOTE — Anesthesia Procedure Notes (Signed)
Procedure Name: Intubation Date/Time: 08/10/2021 12:46 PM Performed by: Claris Che, CRNA Pre-anesthesia Checklist: Patient identified, Emergency Drugs available, Suction available, Patient being monitored and Timeout performed Patient Re-evaluated:Patient Re-evaluated prior to induction Oxygen Delivery Method: Circle system utilized Preoxygenation: Pre-oxygenation with 100% oxygen Induction Type: IV induction and Cricoid Pressure applied Ventilation: Mask ventilation without difficulty Laryngoscope Size: Mac and 4 Grade View: Grade II Tube type: Oral Tube size: 7.5 mm Number of attempts: 1 Airway Equipment and Method: Stylet Placement Confirmation: ETT inserted through vocal cords under direct vision, positive ETCO2 and breath sounds checked- equal and bilateral Secured at: 22 cm Tube secured with: Tape Dental Injury: Teeth and Oropharynx as per pre-operative assessment

## 2021-08-10 NOTE — H&P (Signed)
Jeanette Mckee is an 70 y.o. female.   Chief Complaint: Back and right leg and left leg pain HPI: 70 year old female with progressive worsening back and bilateral leg pain worse on the right with numbness and tingling in her legs and feet.  Work-up has revealed severe and critical spinal stenosis at L4-5 with a grade 1 spondylolisthesis.  Due to the patient's progression of clinical syndrome imaging findings and failed conservative treatment I recommended decompression stabilization procedure with an interbody fusion at that level.  I have extensively gone over the risks and benefits of that operation with her as well as perioperative course expectations of outcome and alternatives of surgery and she understood and agreed to proceed forward.  Past Medical History:  Diagnosis Date   Headache    History of chicken pox    childhood   Hypertension    Measles as a child   Other and unspecified hyperlipidemia 02/11/2013   Preventative health care 02/11/2013    Past Surgical History:  Procedure Laterality Date   ABDOMINAL HYSTERECTOMY  12/1994   and RSO & LS    Family History  Problem Relation Age of Onset   Alcohol abuse Father    Heart disease Father    Stroke Father    Hypertension Father    Diabetes Father        type 2   Cancer Father        prostate   Heart disease Mother    Hypertension Mother    Arthritis Mother    Heart attack Mother 47   Crohn's disease Mother    Cancer Maternal Grandmother        bone- nonsmoker   Heart disease Son        MI s/p 3 stents, January 2015   Breast cancer Neg Hx    Colon cancer Neg Hx    Social History:  reports that she quit smoking about 19 years ago. Her smoking use included cigarettes. She has never used smokeless tobacco. She reports current alcohol use. She reports that she does not use drugs.  Allergies: No Known Allergies  Medications Prior to Admission  Medication Sig Dispense Refill   amLODipine (NORVASC) 5 MG tablet TAKE 1  TABLET BY MOUTH  DAILY 90 tablet 3   estradiol (ESTRACE) 0.1 MG/GM vaginal cream INSERT 1 TO 2 GRAMS  VAGINALLY UP TO 3 TIMES  WEEKLY AS NEEDED (Patient taking differently: Place 1 Applicatorful vaginally 2 (two) times a week.) 85 g 3   lisinopril-hydrochlorothiazide (ZESTORETIC) 20-12.5 MG tablet TAKE 2 TABLETS BY MOUTH  DAILY 180 tablet 3   triamcinolone (NASACORT) 55 MCG/ACT AERO nasal inhaler Place 2 sprays into the nose daily.     benzonatate (TESSALON) 100 MG capsule Take 1 capsule (100 mg total) by mouth 3 (three) times daily as needed. (Patient not taking: No sig reported) 30 capsule 0   diclofenac (CATAFLAM) 50 MG tablet TAKE 1 TABLET BY MOUTH  TWICE DAILY AS NEEDED (Patient not taking: Reported on 08/03/2021) 180 tablet 3   fluticasone (FLONASE) 50 MCG/ACT nasal spray Place 2 sprays into both nostrils daily. (Patient not taking: No sig reported) 16 g 6    Results for orders placed or performed during the hospital encounter of 08/10/21 (from the past 48 hour(s))  ABO/Rh     Status: None   Collection Time: 08/10/21 10:37 AM  Result Value Ref Range   ABO/RH(D)      A NEG Performed at [facility],  [address] [place], [place] [address]    No results found.  Review of Systems  Musculoskeletal:  Positive for back pain.  Neurological:  Positive for numbness.   Blood pressure (!) 187/93, pulse 75, temperature 98.3 F (36.8 C), resp. rate 18, height 5' 5.5" (1.664 m), weight 86.2 kg, SpO2 94 %. Physical Exam HENT:     Head: Normocephalic.     Right Ear: Tympanic membrane normal.     Nose: Nose normal.     Mouth/Throat:     Mouth: Mucous membranes are moist.  Eyes:     Pupils: Pupils are equal, round, and reactive to light.  Cardiovascular:     Rate and Rhythm: Normal rate.  Pulmonary:     Effort: Pulmonary effort is normal.  Abdominal:     General: Abdomen is flat.  Musculoskeletal:        General: Normal range of motion.  Skin:    General: Skin is warm.   Neurological:     General: No focal deficit present.     Mental Status: She is alert.     Assessment/Plan 70 year old presents for decompressive laminectomy interbody fusion L4-5  Mariam Dollar, MD 08/10/2021, 11:57 AM

## 2021-08-11 DIAGNOSIS — M48061 Spinal stenosis, lumbar region without neurogenic claudication: Secondary | ICD-10-CM | POA: Diagnosis not present

## 2021-08-11 DIAGNOSIS — M4316 Spondylolisthesis, lumbar region: Secondary | ICD-10-CM | POA: Diagnosis not present

## 2021-08-11 DIAGNOSIS — Z79899 Other long term (current) drug therapy: Secondary | ICD-10-CM | POA: Diagnosis not present

## 2021-08-11 DIAGNOSIS — Z87891 Personal history of nicotine dependence: Secondary | ICD-10-CM | POA: Diagnosis not present

## 2021-08-11 MED ORDER — OXYCODONE-ACETAMINOPHEN 5-325 MG PO TABS: 1.0000 | ORAL_TABLET | ORAL | 0 refills | Status: DC | PRN

## 2021-08-11 MED ORDER — CYCLOBENZAPRINE HCL 10 MG PO TABS: 10.0000 mg | ORAL_TABLET | Freq: Three times a day (TID) | ORAL | 0 refills | Status: DC | PRN

## 2021-08-11 NOTE — Evaluation (Signed)
Occupational Therapy Evaluation Patient Details Name: Jeanette Mckee MRN: [id number] DOB: [birth year] Today's Date: 08/11/2021   History of Present Illness Patietn s/p PLIF L4-5   Clinical Impression   Jeanette Mckee is a 70 year old woman s/p spinal surgery who presents with spinal precautions and pain. Patient instructed on log roll technique, spinal precautions and compensatory strategies for ADLs. Patient verbalized understanding. Patient able to perform ADLs safely and needed min assist to don shoes due to collapsible heels. Patient has no further OT needs at this time.        Recommendations for follow up therapy are one component of a multi-disciplinary discharge planning process, led by the attending physician.  Recommendations may be updated based on patient status, additional functional criteria and insurance authorization.   Follow Up Recommendations  No OT follow up    Equipment Recommendations  None recommended by OT    Recommendations for Other Services       Precautions / Restrictions Precautions Precautions: Back Precaution Booklet Issued:  (handouts) Precaution Comments: No BLT Restrictions Weight Bearing Restrictions: No      Mobility Bed Mobility Overal bed mobility: Modified Independent             General bed mobility comments: demonstrated log roll technique    Transfers Overall transfer level: Needs assistance Equipment used: None Transfers: Sit to/from Stand           General transfer comment: min guard to supervision for ambulation in room    Balance Overall balance assessment: Mild deficits observed, not formally tested                                         ADL either performed or assessed with clinical judgement   ADL Overall ADL's : Needs assistance/impaired Eating/Feeding: Independent   Grooming: Independent Grooming Details (indicate cue type and reason): cues to use cup method Upper Body Bathing:  Independent   Lower Body Bathing: Minimal assistance   Upper Body Dressing : Independent   Lower Body Dressing: Minimal assistance;Sit to/from stand   Toilet Transfer: Independent   Toileting- Architect and Hygiene: Independent       Functional mobility during ADLs: Min guard       Vision Patient Visual Report: No change from baseline       Perception     Praxis      Pertinent Vitals/Pain Pain Assessment: Faces Faces Pain Scale: Hurts little more Pain Location: Low back Pain Descriptors / Indicators: Aching Pain Intervention(s): Limited activity within patient's tolerance;Monitored during session     Hand Dominance     Extremity/Trunk Assessment Upper Extremity Assessment Upper Extremity Assessment: Overall WFL for tasks assessed   Lower Extremity Assessment Lower Extremity Assessment: Defer to PT evaluation   Cervical / Trunk Assessment Cervical / Trunk Assessment: Normal   Communication Communication Communication: No difficulties   Cognition Arousal/Alertness: Awake/alert Behavior During Therapy: WFL for tasks assessed/performed Overall Cognitive Status: Within Functional Limits for tasks assessed                                     General Comments       Exercises     Shoulder Instructions      Home Living Family/patient expects to be discharged to:: Private residence Living Arrangements: Spouse/significant  other Available Help at Discharge: Family Type of Home: House Home Access: Stairs to enter Secretary/administrator of Steps: 3 Entrance Stairs-Rails: Right       Bathroom Shower/Tub: Walk-in shower         Home Equipment: Toilet riser;Hand held shower head          Prior Functioning/Environment Level of Independence: Needs assistance  Gait / Transfers Assistance Needed: has been holding on to her husband ADL's / Homemaking Assistance Needed: has been needing asssitance for LB dressing and home  tasks            OT Problem List: Decreased knowledge of use of DME or AE;Decreased knowledge of precautions      OT Treatment/Interventions:      OT Goals(Current goals can be found in the care plan section) Acute Rehab OT Goals OT Goal Formulation: All assessment and education complete, DC therapy  OT Frequency:     Barriers to D/C:            Co-evaluation              AM-PAC OT "6 Clicks" Daily Activity     Outcome Measure Help from another person eating meals?: None Help from another person taking care of personal grooming?: None Help from another person toileting, which includes using toliet, bedpan, or urinal?: None Help from another person bathing (including washing, rinsing, drying)?: None Help from another person to put on and taking off regular upper body clothing?: None Help from another person to put on and taking off regular lower body clothing?: A Little 6 Click Score: 23   End of Session Nurse Communication: Mobility status  Activity Tolerance: Patient tolerated treatment well Patient left: in chair;with call bell/phone within reach  OT Visit Diagnosis: Pain                Time: 6384-5364 OT Time Calculation (min): 19 min Charges:  OT General Charges $OT Visit: 1 Visit OT Evaluation $OT Eval Low Complexity: 1 Low  Jeanette Mckee, OTR/L Acute Care Rehab Services  Office [phone] Pager: [phone]   Kelli Churn 08/11/2021, 9:02 AM

## 2021-08-11 NOTE — Evaluation (Signed)
Physical Therapy Evaluation and Discharge Patient Details Name: Jeanette Mckee MRN: [id number] DOB: [birth year] Today's Date: 08/11/2021  History of Present Illness  Pt is a 70 y/o female who presents s/p L4-L5 PLIF on 08/10/2021. PMH signficant for HTN.  Clinical Impression  Patient evaluated by Physical Therapy with no further acute PT needs identified. All education has been completed and the patient has no further questions. Pt was able to demonstrate transfers and ambulation with gross modified independence and no AD. Pt was educated on precautions, brace application/wearing schedule, appropriate activity progression, and car transfer. See below for any follow-up Physical Therapy or equipment needs. PT is signing off. Thank you for this referral.        Recommendations for follow up therapy are one component of a multi-disciplinary discharge planning process, led by the attending physician.  Recommendations may be updated based on patient status, additional functional criteria and insurance authorization.  Follow Up Recommendations No PT follow up;Supervision - Intermittent    Equipment Recommendations  None recommended by PT    Recommendations for Other Services       Precautions / Restrictions Precautions Precautions: Back;Fall Precaution Booklet Issued: Yes (comment) Precaution Comments: Reviewed handout and pt was cued for precautions during functional mobility. Restrictions Weight Bearing Restrictions: No      Mobility  Bed Mobility Overal bed mobility: Modified Independent             General bed mobility comments: Pt received sitting up in the recliner.    Transfers Overall transfer level: Modified independent Equipment used: None Transfers: Sit to/from Stand           General transfer comment: No assist to power-up to full stand. VC's for wide BOS. Pt demonstrated proper hand placement on seated surface for safety.  Ambulation/Gait Ambulation/Gait  assistance: Modified independent (Device/Increase time) Gait Distance (Feet): 500 Feet Assistive device: None Gait Pattern/deviations: Step-through pattern;Decreased stride length;Trunk flexed Gait velocity: Decreased Gait velocity interpretation: 1.31 - 2.62 ft/sec, indicative of limited community ambulator General Gait Details: Pt ambulating fairly well without assistance or AD. Pt without overt LOB. Mild drop on the R side during stance phase. Pt reports she was wearing a heel lift PTA due to alignment issues in back/hips.  Stairs Stairs: Yes Stairs assistance: Supervision Stair Management: One rail Right;Step to pattern;Forwards Number of Stairs: 10 General stair comments: VC's for sequencing and general safety.  Wheelchair Mobility    Modified Rankin (Stroke Patients Only)       Balance Overall balance assessment: Mild deficits observed, not formally tested                                           Pertinent Vitals/Pain Pain Assessment: Faces Faces Pain Scale: Hurts little more Pain Location: Low back Pain Descriptors / Indicators: Aching Pain Intervention(s): Limited activity within patient's tolerance;Monitored during session;Repositioned    Home Living Family/patient expects to be discharged to:: Private residence Living Arrangements: Spouse/significant other Available Help at Discharge: Family Type of Home: House Home Access: Stairs to enter Entrance Stairs-Rails: Right Entrance Stairs-Number of Steps: 3   Home Equipment: Toilet riser;Hand held shower head      Prior Function Level of Independence: Needs assistance   Gait / Transfers Assistance Needed: has been holding on to her husband for support  ADL's / Homemaking Assistance Needed: has been needing asssitance for LB dressing and  IADL's - cleaning        Hand Dominance        Extremity/Trunk Assessment   Upper Extremity Assessment Upper Extremity Assessment: Defer to OT  evaluation    Lower Extremity Assessment Lower Extremity Assessment: Generalized weakness (Consistent with pre-op diagnosis)    Cervical / Trunk Assessment Cervical / Trunk Assessment: Other exceptions Cervical / Trunk Exceptions: s/p surgery  Communication   Communication: No difficulties  Cognition Arousal/Alertness: Awake/alert Behavior During Therapy: WFL for tasks assessed/performed Overall Cognitive Status: Within Functional Limits for tasks assessed                                        General Comments      Exercises     Assessment/Plan    PT Assessment Patent does not need any further PT services  PT Problem List         PT Treatment Interventions      PT Goals (Current goals can be found in the Care Plan section)  Acute Rehab PT Goals Patient Stated Goal: Home today PT Goal Formulation: All assessment and education complete, DC therapy    Frequency     Barriers to discharge        Co-evaluation               AM-PAC PT "6 Clicks" Mobility  Outcome Measure Help needed turning from your back to your side while in a flat bed without using bedrails?: None Help needed moving from lying on your back to sitting on the side of a flat bed without using bedrails?: None Help needed moving to and from a bed to a chair (including a wheelchair)?: None Help needed standing up from a chair using your arms (e.g., wheelchair or bedside chair)?: None Help needed to walk in hospital room?: A Little Help needed climbing 3-5 steps with a railing? : A Little 6 Click Score: 22    End of Session Equipment Utilized During Treatment: Gait belt Activity Tolerance: Patient tolerated treatment well Patient left: in chair;with call bell/phone within reach Nurse Communication: Mobility status PT Visit Diagnosis: Unsteadiness on feet (R26.81);Pain Pain - part of body:  (back)    Time: 3845-3646 PT Time Calculation (min) (ACUTE ONLY): 25  min   Charges:   PT Evaluation $PT Eval Low Complexity: 1 Low PT Treatments $Gait Training: 8-22 mins        Conni Slipper, PT, DPT Acute Rehabilitation Services Pager: [phone] Office: [phone]   Jeanette Mckee 08/11/2021, 9:22 AM

## 2021-08-11 NOTE — Progress Notes (Signed)
Patient was transported via wheelchair by volunteer for discharge home; in no acute distress nor complaints of pain nor discomfort; Hemovac drain was removed and applied a new honeycomb dressing which is clean, dry and intact; room was checked and accounted for all her belongings; discharge instructions was discussed with patient by RN regarding her medications, follow up appointment, wound care and when to call the doctor as needed and she verbalized understanding on the instructions given.

## 2021-08-11 NOTE — Discharge Instructions (Signed)

## 2021-08-11 NOTE — Anesthesia Postprocedure Evaluation (Signed)
Anesthesia Post Note  Patient: Jeanette Mckee  Procedure(s) Performed: Posterior Lumbar Interbody Fusion  - Lumbar four-Lumbar five - Interbody Fusion (Spine Lumbar)     Patient location during evaluation: PACU Anesthesia Type: General Level of consciousness: awake and alert Pain management: pain level controlled Vital Signs Assessment: post-procedure vital signs reviewed and stable Respiratory status: spontaneous breathing, nonlabored ventilation, respiratory function stable and patient connected to nasal cannula oxygen Cardiovascular status: blood pressure returned to baseline and stable Postop Assessment: no apparent nausea or vomiting Anesthetic complications: no   No notable events documented.  Last Vitals:  Vitals:   08/11/21 0533 08/11/21 0741  BP: (!) 109/50 (!) 156/67  Pulse: (!) 56 62  Resp: 18 18  Temp: (!) 36.4 C 36.9 C  SpO2: 95% 96%    Last Pain:  Vitals:   08/11/21 0848  TempSrc:   PainSc: 5    Pain Goal: Patients Stated Pain Goal: 1 (08/11/21 0848)                 Abrian Hanover L Lynell Greenhouse

## 2021-08-11 NOTE — Plan of Care (Signed)
  Problem: Education: Goal: Ability to verbalize activity precautions or restrictions will improve Outcome: Completed/Met Goal: Knowledge of the prescribed therapeutic regimen will improve Outcome: Completed/Met Goal: Understanding of discharge needs will improve Outcome: Completed/Met   Problem: Bowel/Gastric: Goal: Gastrointestinal status for postoperative course will improve Outcome: Completed/Met   Problem: Clinical Measurements: Goal: Ability to maintain clinical measurements within normal limits will improve Outcome: Completed/Met Goal: Postoperative complications will be avoided or minimized Outcome: Completed/Met Goal: Diagnostic test results will improve Outcome: Completed/Met   Problem: Skin Integrity: Goal: Will show signs of wound healing Outcome: Completed/Met   Problem: Health Behavior/Discharge Planning: Goal: Identification of resources available to assist in meeting health care needs will improve Outcome: Completed/Met

## 2021-08-11 NOTE — Discharge Summary (Signed)
Physician Discharge Summary  Patient ID: Jeanette Mckee MRN: [id number] DOB/AGE: 72-Nov-1952 72 y.o.  Admit date: 08/10/2021 Discharge date: 08/11/2021  Admission Diagnoses: Grade 1 spondylolisthesis severe lumbar spinal stenosis L4-5    Discharge Diagnoses: same   Discharged Condition: good  Hospital Course: The patient was admitted on 08/10/2021 and taken to the operating room where the patient underwent PLIF L4-5. The patient tolerated the procedure well and was taken to the recovery room and then to the floor in stable condition. The hospital course was routine. There were no complications. The wound remained clean dry and intact. Pt had appropriate back soreness. No complaints of leg pain or new N/T/W. The patient remained afebrile with stable vital signs, and tolerated a regular diet. The patient continued to increase activities, and pain was well controlled with oral pain medications.   Consults: None  Significant Diagnostic Studies:  Results for orders placed or performed during the hospital encounter of 08/10/21  ABO/Rh  Result Value Ref Range   ABO/RH(D)      A NEG Performed at [facility], [address], [place], [place] [address]     DG Lumbar Spine 2-3 Views  Result Date: 08/10/2021 CLINICAL DATA:  L4-5 fusion EXAM: LUMBAR SPINE - 2-3 VIEW COMPARISON:  03/11/2015 FINDINGS: Two fluoroscopic images are obtained during the performance of the procedure and are provided for interpretation only. Discectomy at L4-5 is noted, with intrapedicular screws identified at L4 and L5. Please refer to the operative report. FLUOROSCOPY TIME:  46 seconds IMPRESSION: 1. L4-5 discectomy and posterior fusion as above. Electronically Signed   By: Sharlet Salina M.D.   On: 08/10/2021 17:21   DG C-Arm 1-60 Min-No Report  Result Date: 08/10/2021 Fluoroscopy was utilized by the requesting physician.  No radiographic interpretation.   DG C-Arm 1-60 Min-No Report  Result Date:  08/10/2021 Fluoroscopy was utilized by the requesting physician.  No radiographic interpretation.    Antibiotics:  Anti-infectives (From admission, onward)    Start     Dose/Rate Route Frequency Ordered Stop   08/10/21 1830  ceFAZolin (ANCEF) IVPB 2g/100 mL premix        2 g 200 mL/hr over 30 Minutes Intravenous Every 8 hours 08/10/21 1736 08/11/21 0229   08/10/21 0945  ceFAZolin (ANCEF) IVPB 2g/100 mL premix  Status:  Discontinued        2 g 200 mL/hr over 30 Minutes Intravenous On call to O.R. 08/10/21 0932 08/10/21 1709       Discharge Exam: Blood pressure (!) 156/67, pulse 62, temperature 98.4 F (36.9 C), temperature source Oral, resp. rate 18, height 5' 5.5" (1.664 m), weight 86.2 kg, SpO2 96 %. Neurologic: Grossly normal Ambulating and voiding well, incision cdi   Discharge Medications:   Allergies as of 08/11/2021   No Known Allergies      Medication List     TAKE these medications    amLODipine 5 MG tablet Commonly known as: NORVASC TAKE 1 TABLET BY MOUTH  DAILY   benzonatate 100 MG capsule Commonly known as: TESSALON Take 1 capsule (100 mg total) by mouth 3 (three) times daily as needed.   cyclobenzaprine 10 MG tablet Commonly known as: FLEXERIL Take 1 tablet (10 mg total) by mouth 3 (three) times daily as needed for muscle spasms.   diclofenac 50 MG tablet Commonly known as: CATAFLAM TAKE 1 TABLET BY MOUTH  TWICE DAILY AS NEEDED   estradiol 0.1 MG/GM vaginal cream Commonly known as: ESTRACE INSERT 1  TO 2 GRAMS  VAGINALLY UP TO 3 TIMES  WEEKLY AS NEEDED What changed:  how much to take how to take this when to take this additional instructions   fluticasone 50 MCG/ACT nasal spray Commonly known as: FLONASE Place 2 sprays into both nostrils daily.   lisinopril-hydrochlorothiazide 20-12.5 MG tablet Commonly known as: ZESTORETIC TAKE 2 TABLETS BY MOUTH  DAILY   oxyCODONE-acetaminophen 5-325 MG tablet Commonly known as: Percocet Take 1 tablet by  mouth every 4 (four) hours as needed for severe pain.   triamcinolone 55 MCG/ACT Aero nasal inhaler Commonly known as: NASACORT Place 2 sprays into the nose daily.        Disposition: home    Final Dx: plif L4-5  Discharge Instructions      Remove dressing in 72 hours   Complete by: As directed    Call MD for:  difficulty breathing, headache or visual disturbances   Complete by: As directed    Call MD for:  hives   Complete by: As directed    Call MD for:  persistant dizziness or light-headedness   Complete by: As directed    Call MD for:  persistant nausea and vomiting   Complete by: As directed    Call MD for:  redness, tenderness, or signs of infection (pain, swelling, redness, odor or green/yellow discharge around incision site)   Complete by: As directed    Call MD for:  severe uncontrolled pain   Complete by: As directed    Call MD for:  temperature >100.4   Complete by: As directed    Diet - low sodium heart healthy   Complete by: As directed    Driving Restrictions   Complete by: As directed    No driving for 2 weeks, no riding in the car for 1 week   Increase activity slowly   Complete by: As directed    Lifting restrictions   Complete by: As directed    No lifting more than 8 lbs          Signed: Tiana Loft Madelyne Millikan 08/11/2021, 7:48 AM

## 2021-08-18 ENCOUNTER — Telehealth: Payer: Self-pay

## 2021-08-18 NOTE — Telephone Encounter (Signed)
Transition Care Management Follow-up Telephone Call Date of discharge and from where: 08/11/21 from [facility] How have you been since you were released from the hospital? Still with some pain, but "I feel like I am making progress every day"  Any questions or concerns? No  Items Reviewed: Did the pt receive and understand the discharge instructions provided? Yes  Medications obtained and verified? Yes  Other? Yes  Any new allergies since your discharge? No  Dietary orders reviewed? Yes Do you have support at home? Yes   Home Care and Equipment/Supplies: Were home health services ordered? no If so, what is the name of the agency? Not applicable  Has the agency set up a time to come to the patient's home? not applicable Were any new equipment or medical supplies ordered?  No What is the name of the medical supply agency? Not applicable Were you able to get the supplies/equipment? not applicable Do you have any questions related to the use of the equipment or supplies? No  Functional Questionnaire: (I = Independent and D = Dependent) ADLs: I  Bathing/Dressing- Assistance  Meal Prep- I  Eating- I  Maintaining continence- I  Transferring/Ambulation- I  Managing Meds- I  Follow up appointments reviewed:  PCP Hospital f/u appt confirmed?  Has appointment with specialist. Specialist Hospital f/u appt confirmed? Yes  Scheduled to see Dr. Donalee Citrin on 08/23/21 @ 3:30pm.. Are transportation arrangements needed? No  If their condition worsens, is the pt aware to call PCP or go to the Emergency Dept.? Yes Was the patient provided with contact information for the PCP's office or ED? Yes Was to pt encouraged to call back with questions or concerns? Yes   Kathyrn Sheriff, RN, MSN, BSN, CCM [facility] Care Management Coordinator [phone]

## 2021-08-18 NOTE — Telephone Encounter (Signed)
Transition Care Management Unsuccessful Follow-up Telephone Call  Date of discharge and from where:  08/11/21 from [facility]  Attempts:  1st Attempt  Reason for unsuccessful TCM follow-up call:  Left voice message    Theoplis Garciagarcia, RN, MSN, BSN, CCM [facility] Care Management Coordinator [phone]    

## 2021-09-15 DIAGNOSIS — M4316 Spondylolisthesis, lumbar region: Secondary | ICD-10-CM | POA: Diagnosis not present

## 2021-09-17 IMAGING — MG MM DIGITAL SCREENING BILAT W/ TOMO AND CAD
8 series · 8 of 24 positions shown · non-contrast
Comparison: Previous exam(s).

CLINICAL DATA: Screening.

EXAM:
DIGITAL SCREENING BILATERAL MAMMOGRAM WITH TOMOSYNTHESIS AND CAD
TECHNIQUE: Bilateral screening digital craniocaudal and mediolateral oblique
mammograms were obtained. Bilateral screening digital breast
tomosynthesis was performed. The images were evaluated with
computer-aided detection.

[L MLO synth-2D]
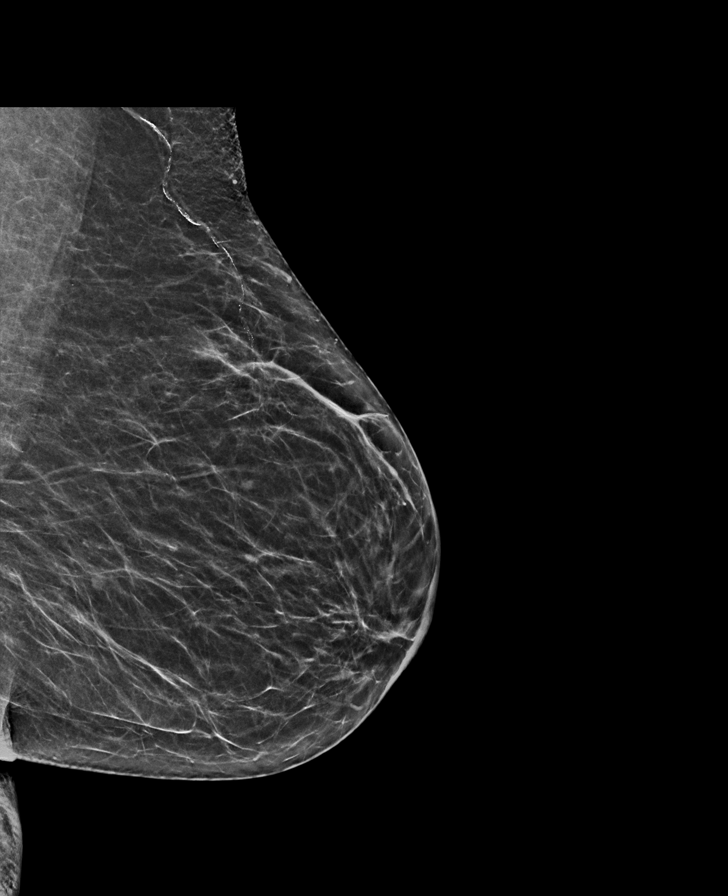

[L CC synth-2D]
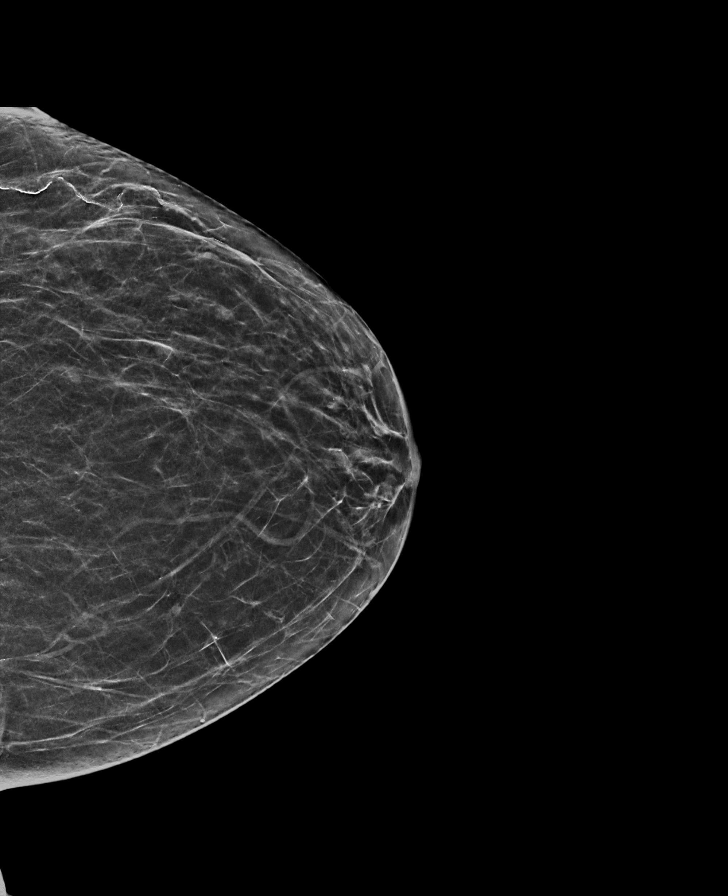

[R MLO synth-2D]
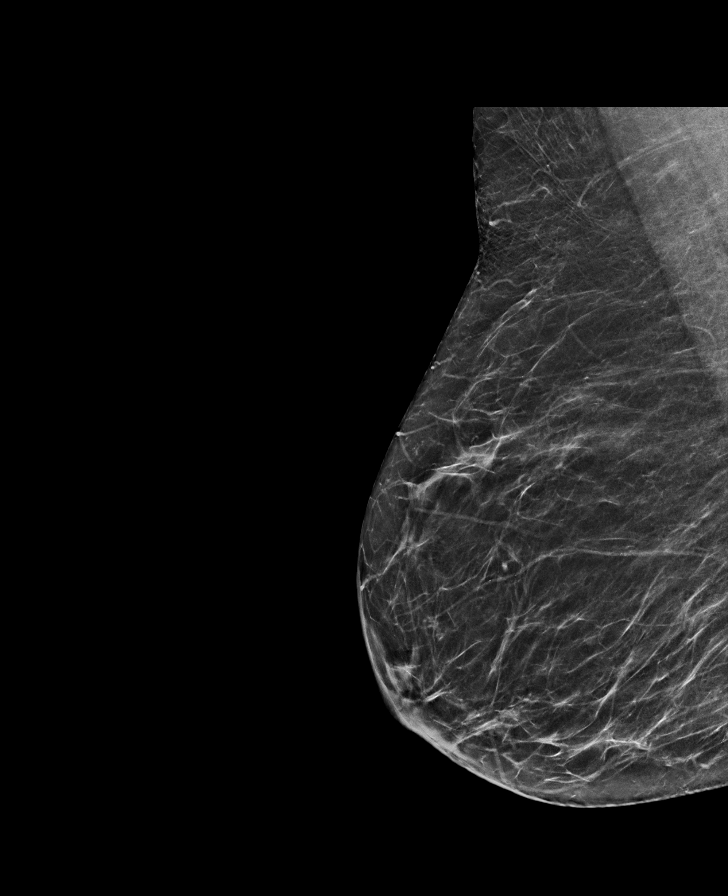

[R CC synth-2D]
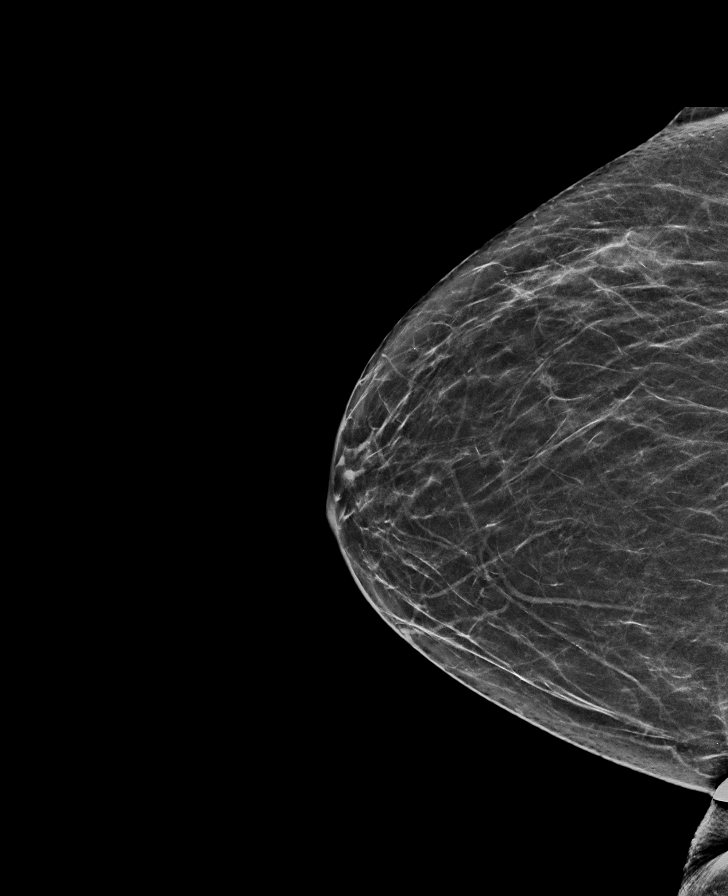

[L MLO tomo · tomo slice 29/57.0]
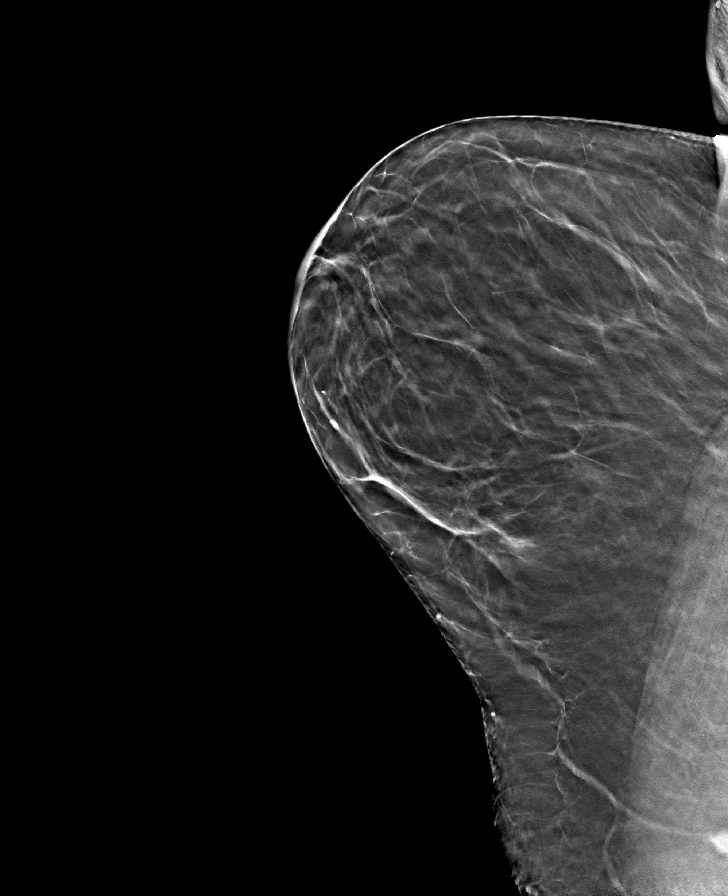

[R MLO tomo · tomo slice 29/57.0]
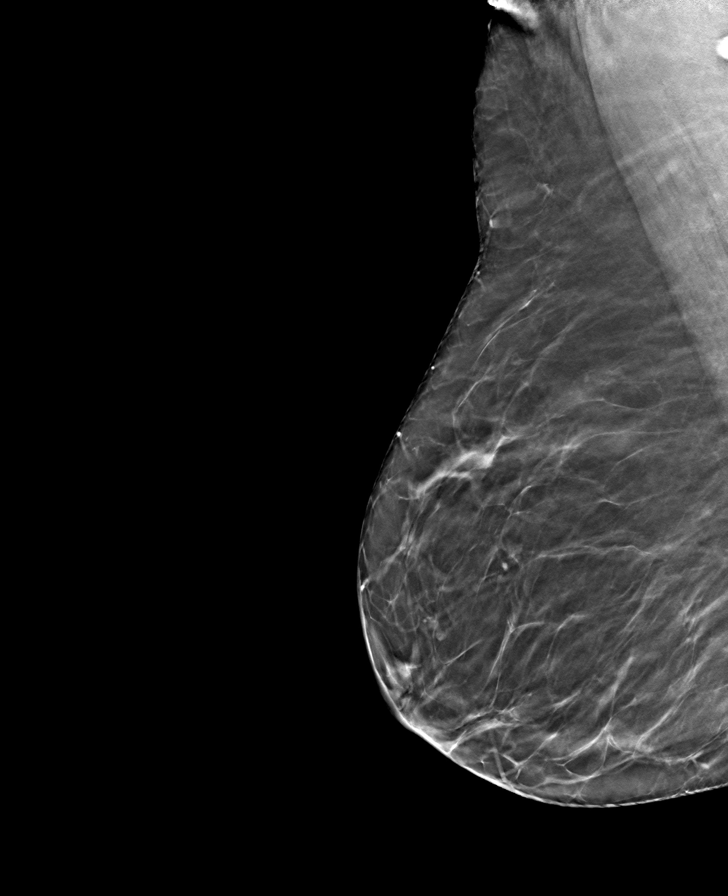

[R CC tomo · tomo slice 27/54.0]
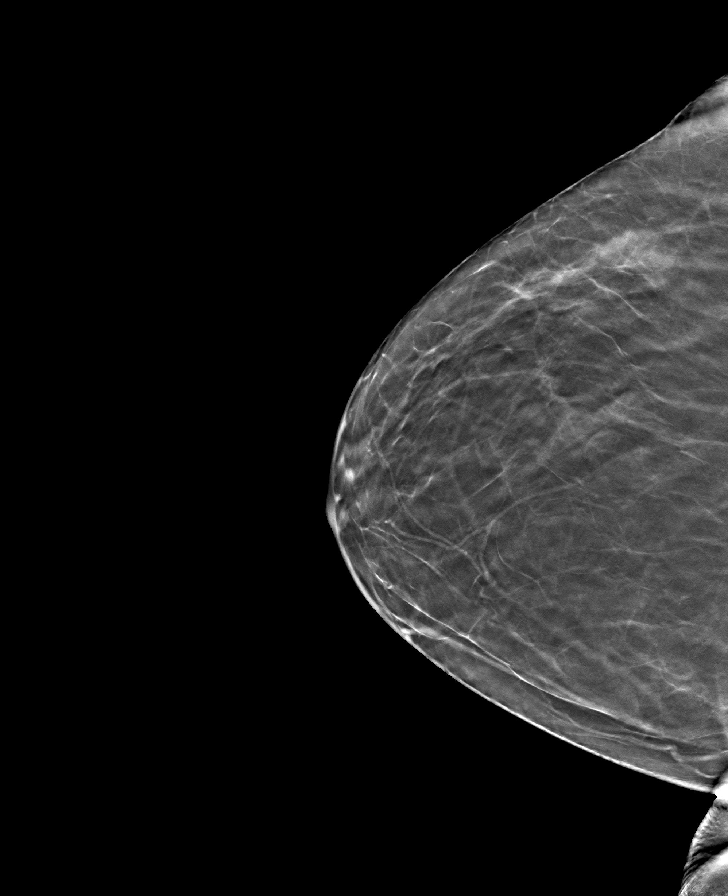

[L CC tomo · tomo slice 27/54.0]
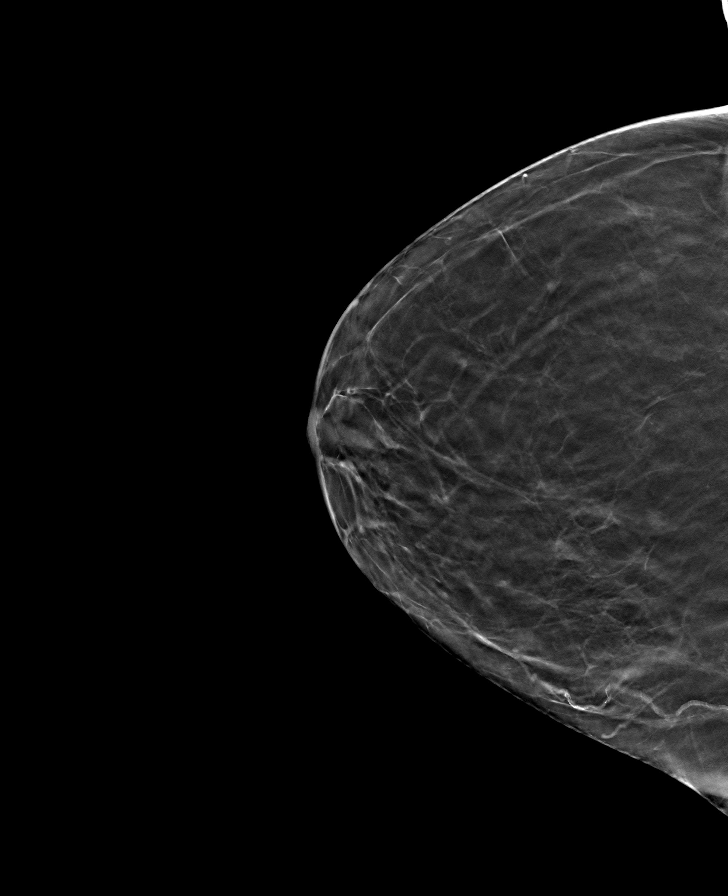

[8 of 24 positions shown; findings below may reference images not displayed]

ACR Breast Density Category b: There are scattered areas of
fibroglandular density.
FINDINGS: There are no findings suspicious for malignancy. The images were
evaluated with computer-aided detection.
IMPRESSION: No mammographic evidence of malignancy. A result letter of this
screening mammogram will be mailed directly to the patient.

RECOMMENDATION:
Screening mammogram in one year. (Code:WJ-[id number])

BI-RADS CATEGORY  1: Negative.

## 2021-09-22 ENCOUNTER — Ambulatory Visit (INDEPENDENT_AMBULATORY_CARE_PROVIDER_SITE_OTHER): Payer: Medicare Other

## 2021-09-22 VITALS — Ht 65.5 in | Wt 178.0 lb

## 2021-09-22 DIAGNOSIS — Z0000 Encounter for general adult medical examination without abnormal findings: Secondary | ICD-10-CM

## 2021-09-22 NOTE — Patient Instructions (Signed)
Jeanette Mckee , Thank you for taking time to complete your Medicare Wellness Visit. I appreciate your ongoing commitment to your health goals. Please review the following plan we discussed and let me know if I can assist you in the future.   Screening recommendations/referrals: Colonoscopy: Completed Cologuard 07/14/2019-Due 07/13/2022 Mammogram: Completed 01/25/2021-Due 01/25/2022 Bone Density: Completed 10/24/2019-Due 10/23/2021 Recommended yearly ophthalmology/optometry visit for glaucoma screening and checkup Recommended yearly dental visit for hygiene and checkup  Vaccinations: Influenza vaccine: Due-May obtain vaccine at our office or your local pharmacy. Pneumococcal vaccine: Up to date Tdap vaccine: Up to date Shingles vaccine: Discuss with pharmacy   Covid-19:Declined Booster  Advanced directives: Please bring a copy of Living Will and/or Healthcare Power of Attorney for your chart once completed.   Conditions/risks identified: See problem list  Next appointment: Follow up in one year for your annual wellness visit 09/25/2022 @ 8:20.   Preventive Care 37 Years and Older, Female Preventive care refers to lifestyle choices and visits with your health care provider that can promote health and wellness. What does preventive care include? A yearly physical exam. This is also called an annual well check. Dental exams once or twice a year. Routine eye exams. Ask your health care provider how often you should have your eyes checked. Personal lifestyle choices, including: Daily care of your teeth and gums. Regular physical activity. Eating a healthy diet. Avoiding tobacco and drug use. Limiting alcohol use. Practicing safe sex. Taking low-dose aspirin every day. Taking vitamin and mineral supplements as recommended by your health care provider. What happens during an annual well check? The services and screenings done by your health care provider during your annual well check will  depend on your age, overall health, lifestyle risk factors, and family history of disease. Counseling  Your health care provider may ask you questions about your: Alcohol use. Tobacco use. Drug use. Emotional well-being. Home and relationship well-being. Sexual activity. Eating habits. History of falls. Memory and ability to understand (cognition). Work and work Astronomer. Reproductive health. Screening  You may have the following tests or measurements: Height, weight, and BMI. Blood pressure. Lipid and cholesterol levels. These may be checked every 5 years, or more frequently if you are over 34 years old. Skin check. Lung cancer screening. You may have this screening every year starting at age 72 if you have a 30-pack-year history of smoking and currently smoke or have quit within the past 15 years. Fecal occult blood test (FOBT) of the stool. You may have this test every year starting at age 72. Flexible sigmoidoscopy or colonoscopy. You may have a sigmoidoscopy every 5 years or a colonoscopy every 10 years starting at age 72. Hepatitis C blood test. Hepatitis B blood test. Sexually transmitted disease (STD) testing. Diabetes screening. This is done by checking your blood sugar (glucose) after you have not eaten for a while (fasting). You may have this done every 1-3 years. Bone density scan. This is done to screen for osteoporosis. You may have this done starting at age 72. Mammogram. This may be done every 1-2 years. Talk to your health care provider about how often you should have regular mammograms. Talk with your health care provider about your test results, treatment options, and if necessary, the need for more tests. Vaccines  Your health care provider may recommend certain vaccines, such as: Influenza vaccine. This is recommended every year. Tetanus, diphtheria, and acellular pertussis (Tdap, Td) vaccine. You may need a Td booster every 10 years. Zoster  vaccine. You may  need this after age 100. Pneumococcal 13-valent conjugate (PCV13) vaccine. One dose is recommended after age 72. Pneumococcal polysaccharide (PPSV23) vaccine. One dose is recommended after age 72. Talk to your health care provider about which screenings and vaccines you need and how often you need them. This information is not intended to replace advice given to you by your health care provider. Make sure you discuss any questions you have with your health care provider. Document Released: 11/26/2015 Document Revised: 07/19/2016 Document Reviewed: 08/31/2015 Elsevier Interactive Patient Education  2017 [place] Prevention in the Home Falls can cause injuries. They can happen to people of all ages. There are many things you can do to make your home safe and to help prevent falls. What can I do on the outside of my home? Regularly fix the edges of walkways and driveways and fix any cracks. Remove anything that might make you trip as you walk through a door, such as a raised step or threshold. Trim any bushes or trees on the path to your home. Use bright outdoor lighting. Clear any walking paths of anything that might make someone trip, such as rocks or tools. Regularly check to see if handrails are loose or broken. Make sure that both sides of any steps have handrails. Any raised decks and porches should have guardrails on the edges. Have any leaves, snow, or ice cleared regularly. Use sand or salt on walking paths during winter. Clean up any spills in your garage right away. This includes oil or grease spills. What can I do in the bathroom? Use night lights. Install grab bars by the toilet and in the tub and shower. Do not use towel bars as grab bars. Use non-skid mats or decals in the tub or shower. If you need to sit down in the shower, use a plastic, non-slip stool. Keep the floor dry. Clean up any water that spills on the floor as soon as it happens. Remove soap buildup in  the tub or shower regularly. Attach bath mats securely with double-sided non-slip rug tape. Do not have throw rugs and other things on the floor that can make you trip. What can I do in the bedroom? Use night lights. Make sure that you have a light by your bed that is easy to reach. Do not use any sheets or blankets that are too big for your bed. They should not hang down onto the floor. Have a firm chair that has side arms. You can use this for support while you get dressed. Do not have throw rugs and other things on the floor that can make you trip. What can I do in the kitchen? Clean up any spills right away. Avoid walking on wet floors. Keep items that you use a lot in easy-to-reach places. If you need to reach something above you, use a strong step stool that has a grab bar. Keep electrical cords out of the way. Do not use floor polish or wax that makes floors slippery. If you must use wax, use non-skid floor wax. Do not have throw rugs and other things on the floor that can make you trip. What can I do with my stairs? Do not leave any items on the stairs. Make sure that there are handrails on both sides of the stairs and use them. Fix handrails that are broken or loose. Make sure that handrails are as long as the stairways. Check any carpeting to make sure that it  is firmly attached to the stairs. Fix any carpet that is loose or worn. Avoid having throw rugs at the top or bottom of the stairs. If you do have throw rugs, attach them to the floor with carpet tape. Make sure that you have a light switch at the top of the stairs and the bottom of the stairs. If you do not have them, ask someone to add them for you. What else can I do to help prevent falls? Wear shoes that: Do not have high heels. Have rubber bottoms. Are comfortable and fit you well. Are closed at the toe. Do not wear sandals. If you use a stepladder: Make sure that it is fully opened. Do not climb a closed  stepladder. Make sure that both sides of the stepladder are locked into place. Ask someone to hold it for you, if possible. Clearly mark and make sure that you can see: Any grab bars or handrails. First and last steps. Where the edge of each step is. Use tools that help you move around (mobility aids) if they are needed. These include: Canes. Walkers. Scooters. Crutches. Turn on the lights when you go into a dark area. Replace any light bulbs as soon as they burn out. Set up your furniture so you have a clear path. Avoid moving your furniture around. If any of your floors are uneven, fix them. If there are any pets around you, be aware of where they are. Review your medicines with your doctor. Some medicines can make you feel dizzy. This can increase your chance of falling. Ask your doctor what other things that you can do to help prevent falls. This information is not intended to replace advice given to you by your health care provider. Make sure you discuss any questions you have with your health care provider. Document Released: 08/26/2009 Document Revised: 04/06/2016 Document Reviewed: 12/04/2014 Elsevier Interactive Patient Education  2017 [employer].

## 2021-09-22 NOTE — Progress Notes (Signed)
Subjective:   Jeanette Mckee is a 70 y.o. female who presents for Medicare Annual (Subsequent) preventive examination.  I connected with Jeanette Mckee today by telephone and verified that I am speaking with the correct person using two identifiers. Location patient: home Location provider: work Persons participating in the virtual visit: patient, Engineer, civil (consulting).    I discussed the limitations, risks, security and privacy concerns of performing an evaluation and management service by telephone and the availability of in person appointments. I also discussed with the patient that there may be a patient responsible charge related to this service. The patient expressed understanding and verbally consented to this telephonic visit.    Interactive audio and video telecommunications were attempted between this provider and patient, however failed, due to patient having technical difficulties OR patient did not have access to video capability.  We continued and completed visit with audio only.  Some vital signs may be absent or patient reported.   Time Spent with patient on telephone encounter: 20 minutes   Review of Systems     Cardiac Risk Factors include: advanced age (>37men, >61 women);hypertension     Objective:    Today's Vitals   09/22/21 0821  Weight: 178 lb (80.7 kg)  Height: 5' 5.5" (1.664 m)  PainSc: 1    Body mass index is 29.17 kg/m.  Advanced Directives 09/22/2021 08/10/2021 08/08/2021 04/27/2020 01/19/2020 09/12/2017  Does Patient Have a Medical Advance Directive? No No No No No Yes  Type of Advance Directive - - - - - Midwife;Living will  Copy of Healthcare Power of Attorney in Chart? - - - - - No - copy requested  Would patient like information on creating a medical advance directive? No - Patient declined No - Patient declined Yes (MAU/Ambulatory/Procedural Areas - Information given) Yes (MAU/Ambulatory/Procedural Areas - Information given) No - Patient declined  -    Current Medications (verified) Outpatient Encounter Medications as of 09/22/2021  Medication Sig   amLODipine (NORVASC) 5 MG tablet TAKE 1 TABLET BY MOUTH  DAILY   estradiol (ESTRACE) 0.1 MG/GM vaginal cream INSERT 1 TO 2 GRAMS  VAGINALLY UP TO 3 TIMES  WEEKLY AS NEEDED (Patient taking differently: Place 1 Applicatorful vaginally 2 (two) times a week.)   lisinopril-hydrochlorothiazide (ZESTORETIC) 20-12.5 MG tablet TAKE 2 TABLETS BY MOUTH  DAILY   benzonatate (TESSALON) 100 MG capsule Take 1 capsule (100 mg total) by mouth 3 (three) times daily as needed. (Patient not taking: No sig reported)   cyclobenzaprine (FLEXERIL) 10 MG tablet Take 1 tablet (10 mg total) by mouth 3 (three) times daily as needed for muscle spasms. (Patient not taking: Reported on 09/22/2021)   diclofenac (CATAFLAM) 50 MG tablet TAKE 1 TABLET BY MOUTH  TWICE DAILY AS NEEDED (Patient not taking: No sig reported)   fluticasone (FLONASE) 50 MCG/ACT nasal spray Place 2 sprays into both nostrils daily. (Patient not taking: No sig reported)   oxyCODONE-acetaminophen (PERCOCET) 5-325 MG tablet Take 1 tablet by mouth every 4 (four) hours as needed for severe pain. (Patient not taking: Reported on 09/22/2021)   triamcinolone (NASACORT) 55 MCG/ACT AERO nasal inhaler Place 2 sprays into the nose daily. (Patient not taking: Reported on 09/22/2021)   No facility-administered encounter medications on file as of 09/22/2021.    Allergies (verified) Patient has no known allergies.   History: Past Medical History:  Diagnosis Date   Headache    History of chicken pox    childhood   Hypertension  Measles as a child   Other and unspecified hyperlipidemia 02/11/2013   Preventative health care 02/11/2013   Past Surgical History:  Procedure Laterality Date   ABDOMINAL HYSTERECTOMY  12/1994   and RSO & LS   Family History  Problem Relation Age of Onset   Alcohol abuse Father    Heart disease Father    Stroke Father     Hypertension Father    Diabetes Father        type 2   Cancer Father        prostate   Heart disease Mother    Hypertension Mother    Arthritis Mother    Heart attack Mother 104   Crohn's disease Mother    Cancer Maternal Grandmother        bone- nonsmoker   Heart disease Son        MI s/p 3 stents, January 2015   Breast cancer Neg Hx    Colon cancer Neg Hx    Social History   Socioeconomic History   Marital status: Married    Spouse name: Not on file   Number of children: Not on file   Years of education: Not on file   Highest education level: Not on file  Occupational History   Not on file  Tobacco Use   Smoking status: Former    Years: 20.00    Types: Cigarettes    Quit date: 11/13/2001    Years since quitting: 19.8   Smokeless tobacco: Never   Tobacco comments:    quit in 2003 1 ppd for 20 years  Vaping Use   Vaping Use: Not on file  Substance and Sexual Activity   Alcohol use: Yes    Comment: WINE FRIDAY AND SATURDAY   Drug use: No   Sexual activity: Yes    Comment: lives with husband, retired from church, [place]. eats a heart healthy diet, minimal meats.exercises 6 days a week  Other Topics Concern   Not on file  Social History Narrative   Not on file   Social Determinants of Health   Financial Resource Strain: Low Risk    Difficulty of Paying Living Expenses: Not hard at all  Food Insecurity: No Food Insecurity   Worried About Charity fundraiser in the Last Year: Never true   [place] in the Last Year: Never true  Transportation Needs: No Transportation Needs   Lack of Transportation (Medical): No   Lack of Transportation (Non-Medical): No  Physical Activity: Insufficiently Active   Days of Exercise per Week: 7 days   Minutes of Exercise per Session: 20 min  Stress: No Stress Concern Present   Feeling of Stress : Not at all  Social Connections: Socially Integrated   Frequency of Communication with Friends and Family: More than three times a  week   Frequency of Social Gatherings with Friends and Family: More than three times a week   Attends Religious Services: More than 4 times per year   Active Member of [employer] or Organizations: Yes   Attends Music therapist: More than 4 times per year   Marital Status: Married    Tobacco Counseling Counseling given: Not Answered Tobacco comments: quit in 2003 1 ppd for 20 years   Clinical Intake:  Pre-visit preparation completed: Yes  Pain : 0-10 Pain Score: 1  (from back surgery) Pain Type: Chronic pain Pain Onset: More than a month ago Pain Frequency: Constant     BMI -  recorded: 29.17 Nutritional Status: BMI 25 -29 Overweight Nutritional Risks: None Diabetes: No  How often do you need to have someone help you when you read instructions, pamphlets, or other written materials from your doctor or pharmacy?: 1 - Never  Diabetic?No  Interpreter Needed?: No  Information entered by :: Caroleen Hamman LPN   Activities of Daily Living In your present state of health, do you have any difficulty performing the following activities: 09/22/2021 08/08/2021  Hearing? N Y  Vision? N N  Difficulty concentrating or making decisions? N N  Walking or climbing stairs? Y Y  Comment due to recent surgery -  Dressing or bathing? N N  Doing errands, shopping? N N  Preparing Food and eating ? N -  Using the Toilet? N -  In the past six months, have you accidently leaked urine? Y -  Do you have problems with loss of bowel control? N -  Managing your Medications? N -  Managing your Finances? N -  Housekeeping or managing your Housekeeping? Y -  Comment due to recent surgery -  Some recent data might be hidden    Patient Care Team: Copland, Gay Filler, MD as PCP - General (Family Medicine) Juanita Craver, MD as Consulting Physician (Gastroenterology)  Indicate any recent Medical Services you may have received from other than [facility] providers in the past year (date may be  approximate).     Assessment:   This is a routine wellness examination for [place].  Hearing/Vision screen Hearing Screening - Comments:: C/o mild hearing loss Vision Screening - Comments:: [facility]  Dietary issues and exercise activities discussed: Current Exercise Habits: Home exercise routine, Type of exercise: walking, Time (Minutes): 20, Frequency (Times/Week): 7, Weekly Exercise (Minutes/Week): 140, Intensity: Mild   Goals Addressed             This Visit's Progress    Patient Stated       Lose some more weight & increase activity       Depression Screen PHQ 2/9 Scores 09/22/2021 01/19/2020 09/12/2017 04/26/2016  PHQ - 2 Score 0 0 0 0    Fall Risk Fall Risk  09/22/2021 01/19/2020 09/12/2017 04/26/2016  Falls in the past year? 1 0 No No  Number falls in past yr: 1 0 - -  Injury with Fall? 0 0 - -  Risk for fall due to : History of fall(s) - - -  Follow up Falls prevention discussed Education provided;Falls prevention discussed - -    FALL RISK PREVENTION PERTAINING TO THE HOME:  Any stairs in or around the home? Yes  If so, are there any without handrails? No  Home free of loose throw rugs in walkways, pet beds, electrical cords, etc? Yes  Adequate lighting in your home to reduce risk of falls? Yes   ASSISTIVE DEVICES UTILIZED TO PREVENT FALLS:  Life alert? No  Use of a cane, walker or w/c? No  Grab bars in the bathroom? No  Shower chair or bench in shower? No  Elevated toilet seat or a handicapped toilet? Yes   TIMED UP AND GO:  Was the test performed? No . Phone visit   Cognitive Function:Normal cognitive status assessed by this Nurse Health Advisor. No abnormalities found.   MMSE - Mini Mental State Exam 09/12/2017  Orientation to time 5  Orientation to Place 5  Registration 3  Attention/ Calculation 5  Recall 3  Language- name 2 objects 2  Language- repeat  1  Language- follow 3 step command 3  Language- read &  follow direction 1  Write a sentence 1  Copy design 1  Total score 30        Immunizations Immunization History  Administered Date(s) Administered   Fluad Quad(high Dose 65+) 10/08/2019   Influenza, High Dose Seasonal PF 07/20/2016, 09/05/2017, 10/02/2018   Moderna Sars-Covid-2 Vaccination 12/27/2019, 01/24/2020   Pneumococcal Conjugate-13 02/03/2016   Pneumococcal Polysaccharide-23 09/05/2017   Tdap 10/02/2018    TDAP status: Up to date  Flu Vaccine status: Due, Education has been provided regarding the importance of this vaccine. Advised may receive this vaccine at local pharmacy or Health Dept. Aware to provide a copy of the vaccination record if obtained from local pharmacy or Health Dept. Verbalized acceptance and understanding.  Pneumococcal vaccine status: Up to date  Covid-19 vaccine status: Declined, Education has been provided regarding the importance of this vaccine but patient still declined. Advised may receive this vaccine at local pharmacy or Health Dept.or vaccine clinic. Aware to provide a copy of the vaccination record if obtained from local pharmacy or Health Dept. Verbalized acceptance and understanding.  Qualifies for Shingles Vaccine? Yes   Zostavax completed No   Shingrix Completed?: No.    Education has been provided regarding the importance of this vaccine. Patient has been advised to call insurance company to determine out of pocket expense if they have not yet received this vaccine. Advised may also receive vaccine at local pharmacy or Health Dept. Verbalized acceptance and understanding.  Screening Tests Health Maintenance  Topic Date Due   Zoster Vaccines- Shingrix (1 of 2) Never done   COVID-19 Vaccine (3 - Booster for Moderna series) 03/20/2020   INFLUENZA VACCINE  06/13/2021   Fecal DNA (Cologuard)  07/13/2022   MAMMOGRAM  01/26/2023   TETANUS/TDAP  10/02/2028   Pneumonia Vaccine 71+ Years old  Completed   DEXA SCAN  Completed   Hepatitis C  Screening  Addressed   HPV VACCINES  Aged Out    Health Maintenance  Health Maintenance Due  Topic Date Due   Zoster Vaccines- Shingrix (1 of 2) Never done   COVID-19 Vaccine (3 - Booster for Moderna series) 03/20/2020   INFLUENZA VACCINE  06/13/2021    Colorectal cancer screening: Type of screening: Cologuard. Completed 07/14/2019. Repeat every 3 years  Mammogram status: Completed bilateral 01/25/2021. Repeat every year  Bone Density status: Completed 10/24/2019. Results reflect: Bone density results: OSTEOPENIA. Repeat every 2 years.  Lung Cancer Screening: (Low Dose CT Chest recommended if Age 59-80 years, 30 pack-year currently smoking OR have quit w/in 15years.) does not qualify.    Additional Screening:  Hepatitis C Screening: Completed 11/13/2005  Vision Screening: Recommended annual ophthalmology exams for early detection of glaucoma and other disorders of the eye. Is the patient up to date with their annual eye exam?  No  Who is the provider or what is the name of the office in which the patient attends annual eye exams? [facility]Patient plans to make an appt   Dental Screening: Recommended annual dental exams for proper oral hygiene  Community Resource Referral / Chronic Care Management: CRR required this visit?  No   CCM required this visit?  No      Plan:     I have personally reviewed and noted the following in the patient's chart:   Medical and social history Use of alcohol, tobacco or illicit drugs  Current medications and supplements including opioid prescriptions.  Functional ability and status Nutritional status Physical activity Advanced directives List of other physicians Hospitalizations, surgeries, and ER visits in previous 12 months Vitals Screenings to include cognitive, depression, and falls Referrals and appointments  In addition, I have reviewed and discussed with patient certain preventive protocols, quality metrics, and best  practice recommendations. A written personalized care plan for preventive services as well as general preventive health recommendations were provided to patient.   Due to this being a telephonic visit, the after visit summary with patients personalized plan was offered to patient via mail or my-chart.  Patient would like to access on my-chart.   Marta Antu, LPN   [id number]  Nurse Health Advisor  Nurse Notes: None

## 2021-10-24 ENCOUNTER — Other Ambulatory Visit: Payer: Self-pay | Admitting: Family Medicine

## 2021-10-24 DIAGNOSIS — N952 Postmenopausal atrophic vaginitis: Secondary | ICD-10-CM

## 2021-10-27 DIAGNOSIS — M4316 Spondylolisthesis, lumbar region: Secondary | ICD-10-CM | POA: Diagnosis not present

## 2021-12-01 ENCOUNTER — Other Ambulatory Visit: Payer: Self-pay | Admitting: Family Medicine

## 2021-12-01 DIAGNOSIS — I10 Essential (primary) hypertension: Secondary | ICD-10-CM

## 2021-12-02 ENCOUNTER — Other Ambulatory Visit: Payer: Self-pay | Admitting: Family Medicine

## 2021-12-02 DIAGNOSIS — I10 Essential (primary) hypertension: Secondary | ICD-10-CM

## 2021-12-02 DIAGNOSIS — Z0000 Encounter for general adult medical examination without abnormal findings: Secondary | ICD-10-CM

## 2022-01-26 DIAGNOSIS — M4316 Spondylolisthesis, lumbar region: Secondary | ICD-10-CM | POA: Diagnosis not present

## 2022-02-22 ENCOUNTER — Other Ambulatory Visit: Payer: Self-pay | Admitting: Family Medicine

## 2022-02-22 DIAGNOSIS — Z0000 Encounter for general adult medical examination without abnormal findings: Secondary | ICD-10-CM

## 2022-02-22 DIAGNOSIS — I10 Essential (primary) hypertension: Secondary | ICD-10-CM

## 2022-03-14 NOTE — Progress Notes (Addendum)
Nature conservation officer at [employer] ?[address], [address] ?[place], [place] [address] ?336 [phone] ?Fax [phone] ? ?Date:  03/16/2022  ? ?Name:  Jeanette Mckee   DOB:  13-Apr-1951   MRN:  [id number] ? ?PCP:  Pearline Cables, MD  ? ? ?Chief Complaint: Medication Refill (Concerns/ questions: last OV was her CPE on 10/2020. Edema in her ankles since her surgery. /) ? ? ?History of Present Illness: ? ?Jeanette Mckee is a 71 y.o. very pleasant female patient who presents with the following: ? ?Patient seen today for medication follow-up-history of hypertension,osteopenia ?Most recent visit with myself was in December 2021 for physical ? ?She has 2 children, 8 grandchildren, 3 great grands ?She enjoys volunteer work- however she recently went back to work!  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 ?She underwent a spinal fusion procedure in September of last year with Dr. Wynetta Emery- she notes great results and "I feel like I got my life back" ? ?Shingles vaccine- recommended to her  ?COVID booster ?Mammogram can be updated ?Bone density can be updated ?Cologuard due in August ?Can update blood work if she would like- she is fasting  ? ?Pt notes her BP at home tends to run 120- 130/75- she does check on a regular basis and notes BP is always high at MD office  ? ?BP Readings from Last 3 Encounters:  ?03/16/22 (!) 162/80  ?08/11/21 (!) 156/67  ?08/08/21 (!) 169/76  ? ?Wt Readings from Last 3 Encounters:  ?03/16/22 191 lb 12.8 oz (87 kg)  ?09/22/21 178 lb (80.7 kg)  ?08/10/21 190 lb (86.2 kg)  ? ?She notes some LE edema- has been on amlodipine for a long time ,the swelling is newer.  Also some weight gain- may be related to surgery and lack of activity  ?Echo in 2017 normal  ?Patient Active Problem List  ? Diagnosis Date Noted  ? Spinal stenosis at L4-L5 level 08/10/2021  ? Osteopenia 10/24/2019  ? Cold sore 04/19/2014  ?  Postmenopausal estrogen deficiency 04/19/2014  ? Other and unspecified hyperlipidemia 02/11/2013  ? HTN (hypertension) 02/04/2012  ? Abnormal liver enzymes 02/04/2012  ? Chronic back pain 02/04/2012  ? ? ?Past Medical History:  ?Diagnosis Date  ? Headache   ? History of chicken pox   ? childhood  ? Hypertension   ? Measles as a child  ? Other and unspecified hyperlipidemia 02/11/2013  ? Preventative health care 02/11/2013  ? ? ?Past Surgical History:  ?Procedure Laterality Date  ? ABDOMINAL HYSTERECTOMY  12/1994  ? and RSO & LS  ? ? ?Social History  ? ?Tobacco Use  ? Smoking status: Former  ?  Years: 20.00  ?  Types: Cigarettes  ?  Quit date: 11/13/2001  ?  Years since quitting: 20.3  ? Smokeless tobacco: Never  ? Tobacco comments:  ?  quit in 2003 1 ppd for 20 years  ?Substance Use Topics  ? Alcohol use: Yes  ?  Comment: WINE FRIDAY AND SATURDAY  ? Drug use: No  ? ? ?Family History  ?Problem Relation Age of Onset  ? Alcohol abuse Father   ? Heart disease Father   ? Stroke Father   ? Hypertension Father   ? Diabetes Father   ?     type 2  ? Cancer Father   ?  prostate  ? Heart disease Mother   ? Hypertension Mother   ? Arthritis Mother   ? Heart attack Mother 27  ? Crohn's disease Mother   ? Cancer Maternal Grandmother   ?     bone- nonsmoker  ? Heart disease Son   ?     MI s/p 3 stents, January 2015  ? Breast cancer Neg Hx   ? Colon cancer Neg Hx   ? ? ?No Known Allergies ? ?Medication list has been reviewed and updated. ? ?Current Outpatient Medications on File Prior to Visit  ?Medication Sig Dispense Refill  ? amLODipine (NORVASC) 5 MG tablet TAKE 1 TABLET BY MOUTH DAILY 30 tablet 0  ? benzonatate (TESSALON) 100 MG capsule Take 1 capsule (100 mg total) by mouth 3 (three) times daily as needed. (Patient not taking: No sig reported) 30 capsule 0  ? cyclobenzaprine (FLEXERIL) 10 MG tablet Take 1 tablet (10 mg total) by mouth 3 (three) times daily as needed for muscle spasms. (Patient not taking: Reported on  09/22/2021) 30 tablet 0  ? diclofenac (CATAFLAM) 50 MG tablet TAKE 1 TABLET BY MOUTH TWICE  DAILY AS NEEDED 60 tablet 0  ? estradiol (ESTRACE) 0.1 MG/GM vaginal cream INSERT 1 TO 2 GRAMS  VAGINALLY UP TO 3 TIMES  WEEKLY AS NEEDED 85 g 3  ? fluticasone (FLONASE) 50 MCG/ACT nasal spray Place 2 sprays into both nostrils daily. (Patient not taking: No sig reported) 16 g 6  ? lisinopril-hydrochlorothiazide (ZESTORETIC) 20-12.5 MG tablet TAKE 2 TABLETS BY MOUTH DAILY 60 tablet 0  ? ?No current facility-administered medications on file prior to visit.  ? ? ?Review of Systems: ? ?As per HPI- otherwise negative. ? ? ? ?Physical Examination: ?Vitals:  ? 03/16/22 0817  ?BP: (!) 162/80  ?Pulse: 74  ?Resp: 18  ?Temp: 98 ?F (36.7 ?C)  ?SpO2: 95%  ? ?Vitals:  ? 03/16/22 0817  ?Weight: 191 lb 12.8 oz (87 kg)  ?Height: 5\' 5"  (1.651 m)  ? ?Body mass index is 31.92 kg/m?. ?Ideal Body Weight: Weight in (lb) to have BMI = 25: 149.9 ? ?GEN: no acute distress.  Mild obesity, looks well  ?HEENT: Atraumatic, Normocephalic.  ?Ears and Nose: No external deformity. ?CV: RRR, No M/G/R. No JVD. No thrill. No extra heart sounds. ?PULM: CTA B, no wheezes, crackles, rhonchi. No retractions. No resp. distress. No accessory muscle use. ?ABD: S, NT, ND ?EXTR: No c/c/trace ankle edema B  ?PSYCH: Normally interactive. Conversant.  ? ? ?Assessment and Plan: ?Essential hypertension - Plan: CBC, Comprehensive metabolic panel, amLODipine (NORVASC) 5 MG tablet, lisinopril-hydrochlorothiazide (ZESTORETIC) 20-12.5 MG tablet ? ?Mixed hyperlipidemia - Plan: Lipid panel ? ?Screening for diabetes mellitus - Plan: Comprehensive metabolic panel, Hemoglobin A1c ? ?Screening for deficiency anemia - Plan: CBC ? ?Estrogen deficiency - Plan: DG Bone Density ? ?Encounter for screening mammogram for malignant neoplasm of breast - Plan: MM 3D SCREEN BREAST BILATERAL ? ?Weight gain - Plan: TSH, B Nat Peptide ? ?Lower extremity edema - Plan: B Nat Peptide ? ?Seen today for  follow-up ?Pt notes her BP is always normal at home- continue current meds ?Swelling is not a huge concern for her- she is an amlodipine but also hctz ?Check labs including BNP ?Asked her to request a cologuard in August  ?Will plan further follow- up pending labs. ? ? ?Signed ?September, MD ? ?Received labs as below, message to patient ?Results for orders placed or performed in visit on 03/16/22  ?CBC  ?  Result Value Ref Range  ? WBC 5.6 4.0 - 10.5 K/uL  ? RBC 4.76 3.87 - 5.11 Mil/uL  ? Platelets 312.0 150.0 - 400.0 K/uL  ? Hemoglobin 14.1 12.0 - 15.0 g/dL  ? HCT 42.2 36.0 - 46.0 %  ? MCV 88.6 78.0 - 100.0 fl  ? MCHC 33.4 30.0 - 36.0 g/dL  ? RDW 13.8 11.5 - 15.5 %  ?Comprehensive metabolic panel  ?Result Value Ref Range  ? Sodium 136 135 - 145 mEq/L  ? Potassium 4.6 3.5 - 5.1 mEq/L  ? Chloride 98 96 - 112 mEq/L  ? CO2 30 19 - 32 mEq/L  ? Glucose, Bld 105 (H) 70 - 99 mg/dL  ? BUN 16 6 - 23 mg/dL  ? Creatinine, Ser 0.58 0.40 - 1.20 mg/dL  ? Total Bilirubin 0.5 0.2 - 1.2 mg/dL  ? Alkaline Phosphatase 109 39 - 117 U/L  ? AST 22 0 - 37 U/L  ? ALT 24 0 - 35 U/L  ? Total Protein 7.3 6.0 - 8.3 g/dL  ? Albumin 4.1 3.5 - 5.2 g/dL  ? GFR 90.32 >60.00 mL/min  ? Calcium 9.5 8.4 - 10.5 mg/dL  ?Hemoglobin A1c  ?Result Value Ref Range  ? Hgb A1c MFr Bld 5.7 4.6 - 6.5 %  ?Lipid panel  ?Result Value Ref Range  ? Cholesterol 221 (H) 0 - 200 mg/dL  ? Triglycerides 62.0 0.0 - 149.0 mg/dL  ? HDL 83.10 >39.00 mg/dL  ? VLDL 12.4 0.0 - 40.0 mg/dL  ? LDL Cholesterol 125 (H) 0 - 99 mg/dL  ? Total CHOL/HDL Ratio 3   ? NonHDL 137.61   ?TSH  ?Result Value Ref Range  ? TSH 1.23 0.35 - 5.50 uIU/mL  ?B Nat Peptide  ?Result Value Ref Range  ? Pro B Natriuretic peptide (BNP) 82.0 0.0 - 100.0 pg/mL  ? ? ?

## 2022-03-14 NOTE — Patient Instructions (Addendum)
It was good to see you again today! I will be in touch with your labs ?Mammo and bone density are ordered ?Please remind me around August and I can order Cologuard for you ?Continue current BP meds ?I will check your routine labs, also a thyroid and BNP (heart failure test) due to weight gain/ swelling ? ?I recommend the shingles series and covid booster if not done in the last 6 months or so  ?

## 2022-03-16 ENCOUNTER — Encounter: Payer: Self-pay | Admitting: Family Medicine

## 2022-03-16 ENCOUNTER — Ambulatory Visit (INDEPENDENT_AMBULATORY_CARE_PROVIDER_SITE_OTHER): Payer: Medicare Other | Admitting: Family Medicine

## 2022-03-16 VITALS — BP 162/80 | HR 74 | Temp 98.0°F | Resp 18 | Ht 65.0 in | Wt 191.8 lb

## 2022-03-16 DIAGNOSIS — Z131 Encounter for screening for diabetes mellitus: Secondary | ICD-10-CM

## 2022-03-16 DIAGNOSIS — E782 Mixed hyperlipidemia: Secondary | ICD-10-CM | POA: Diagnosis not present

## 2022-03-16 DIAGNOSIS — E2839 Other primary ovarian failure: Secondary | ICD-10-CM

## 2022-03-16 DIAGNOSIS — Z1231 Encounter for screening mammogram for malignant neoplasm of breast: Secondary | ICD-10-CM | POA: Diagnosis not present

## 2022-03-16 DIAGNOSIS — R635 Abnormal weight gain: Secondary | ICD-10-CM | POA: Diagnosis not present

## 2022-03-16 DIAGNOSIS — R600 Localized edema: Secondary | ICD-10-CM

## 2022-03-16 DIAGNOSIS — Z130 Encounter for screening for diseases of the blood and blood-forming organs and certain disorders involving the immune mechanism: Secondary | ICD-10-CM | POA: Diagnosis not present

## 2022-03-16 DIAGNOSIS — N952 Postmenopausal atrophic vaginitis: Secondary | ICD-10-CM

## 2022-03-16 DIAGNOSIS — I10 Essential (primary) hypertension: Secondary | ICD-10-CM

## 2022-03-16 LAB — COMPREHENSIVE METABOLIC PANEL
ALT: 24 U/L (ref 0–35)
AST: 22 U/L (ref 0–37)
Albumin: 4.1 g/dL (ref 3.5–5.2)
Alkaline Phosphatase: 109 U/L (ref 39–117)
BUN: 16 mg/dL (ref 6–23)
CO2: 30 mEq/L (ref 19–32)
Calcium: 9.5 mg/dL (ref 8.4–10.5)
Chloride: 98 mEq/L (ref 96–112)
Creatinine, Ser: 0.58 mg/dL (ref 0.40–1.20)
GFR: 90.32 mL/min (ref >60.00)
Glucose, Bld: 105 mg/dL — ABNORMAL HIGH (ref 70–99)
Potassium: 4.6 mEq/L (ref 3.5–5.1)
Sodium: 136 mEq/L (ref 135–145)
Total Bilirubin: 0.5 mg/dL (ref 0.2–1.2)
Total Protein: 7.3 g/dL (ref 6.0–8.3)

## 2022-03-16 LAB — LIPID PANEL
Cholesterol: 221 mg/dL — ABNORMAL HIGH (ref 0–200)
HDL: 83.1 mg/dL (ref >39.00)
LDL Cholesterol: 125 mg/dL — ABNORMAL HIGH (ref 0–99)
NonHDL: 137.61
Total CHOL/HDL Ratio: 3
Triglycerides: 62 mg/dL (ref 0.0–149.0)
VLDL: 12.4 mg/dL (ref 0.0–40.0)

## 2022-03-16 LAB — HEMOGLOBIN A1C: Hgb A1c MFr Bld: 5.7 % (ref 4.6–6.5)

## 2022-03-16 LAB — CBC
HCT: 42.2 % (ref 36.0–46.0)
Hemoglobin: 14.1 g/dL (ref 12.0–15.0)
MCHC: 33.4 g/dL (ref 30.0–36.0)
MCV: 88.6 fl (ref 78.0–100.0)
Platelets: 312 10*3/uL (ref 150.0–400.0)
RBC: 4.76 Mil/uL (ref 3.87–5.11)
RDW: 13.8 % (ref 11.5–15.5)
WBC: 5.6 10*3/uL (ref 4.0–10.5)

## 2022-03-16 LAB — TSH: TSH: 1.23 u[IU]/mL (ref 0.35–5.50)

## 2022-03-16 LAB — BRAIN NATRIURETIC PEPTIDE: Pro B Natriuretic peptide (BNP): 82 pg/mL (ref 0.0–100.0)

## 2022-03-16 MED ORDER — LISINOPRIL-HYDROCHLOROTHIAZIDE 20-12.5 MG PO TABS: 2.0000 | ORAL_TABLET | Freq: Every day | ORAL | 3 refills | Status: DC

## 2022-03-16 MED ORDER — AMLODIPINE BESYLATE 5 MG PO TABS: 5.0000 mg | ORAL_TABLET | Freq: Every day | ORAL | 3 refills | Status: DC

## 2022-03-17 MED ORDER — ESTRADIOL 0.1 MG/GM VA CREA: 1.0000 | TOPICAL_CREAM | VAGINAL | 3 refills | Status: DC

## 2022-03-27 ENCOUNTER — Encounter (HOSPITAL_BASED_OUTPATIENT_CLINIC_OR_DEPARTMENT_OTHER): Payer: Self-pay

## 2022-03-27 ENCOUNTER — Ambulatory Visit (HOSPITAL_BASED_OUTPATIENT_CLINIC_OR_DEPARTMENT_OTHER)
Admission: RE | Admit: 2022-03-27 | Discharge: 2022-03-27 | Disposition: A | Discharge status: Home / Self Care | Payer: Medicare Other | Source: Ambulatory Visit | Attending: Family Medicine | Admitting: Family Medicine

## 2022-03-27 DIAGNOSIS — E2839 Other primary ovarian failure: Secondary | ICD-10-CM | POA: Insufficient documentation

## 2022-03-27 DIAGNOSIS — Z1231 Encounter for screening mammogram for malignant neoplasm of breast: Secondary | ICD-10-CM | POA: Diagnosis not present

## 2022-03-27 DIAGNOSIS — Z780 Asymptomatic menopausal state: Secondary | ICD-10-CM | POA: Diagnosis not present

## 2022-03-27 DIAGNOSIS — M8589 Other specified disorders of bone density and structure, multiple sites: Secondary | ICD-10-CM | POA: Diagnosis not present

## 2022-03-28 ENCOUNTER — Encounter: Payer: Self-pay | Admitting: Family Medicine

## 2022-04-07 ENCOUNTER — Other Ambulatory Visit: Payer: Self-pay | Admitting: Family Medicine

## 2022-04-07 DIAGNOSIS — Z0000 Encounter for general adult medical examination without abnormal findings: Secondary | ICD-10-CM

## 2022-05-11 DIAGNOSIS — M4316 Spondylolisthesis, lumbar region: Secondary | ICD-10-CM | POA: Diagnosis not present

## 2022-05-31 DIAGNOSIS — H2513 Age-related nuclear cataract, bilateral: Secondary | ICD-10-CM | POA: Diagnosis not present

## 2022-05-31 DIAGNOSIS — H40013 Open angle with borderline findings, low risk, bilateral: Secondary | ICD-10-CM | POA: Diagnosis not present

## 2022-05-31 DIAGNOSIS — H524 Presbyopia: Secondary | ICD-10-CM | POA: Diagnosis not present

## 2022-06-30 ENCOUNTER — Encounter: Payer: Self-pay | Admitting: Family Medicine

## 2022-06-30 DIAGNOSIS — Z1211 Encounter for screening for malignant neoplasm of colon: Secondary | ICD-10-CM

## 2022-07-25 DIAGNOSIS — Z1211 Encounter for screening for malignant neoplasm of colon: Secondary | ICD-10-CM | POA: Diagnosis not present

## 2022-07-29 LAB — COLOGUARD: COLOGUARD: NEGATIVE

## 2022-07-30 ENCOUNTER — Encounter: Payer: Self-pay | Admitting: Family Medicine

## 2022-09-03 ENCOUNTER — Other Ambulatory Visit: Payer: Self-pay

## 2022-09-03 ENCOUNTER — Encounter (HOSPITAL_COMMUNITY)
Admission: EM | Disposition: A | Discharge status: Home / Self Care | Payer: Self-pay | Source: Home / Self Care | Attending: Cardiovascular Disease

## 2022-09-03 ENCOUNTER — Inpatient Hospital Stay (HOSPITAL_COMMUNITY): Payer: Medicare Other

## 2022-09-03 ENCOUNTER — Emergency Department (HOSPITAL_BASED_OUTPATIENT_CLINIC_OR_DEPARTMENT_OTHER): Payer: Medicare Other

## 2022-09-03 ENCOUNTER — Inpatient Hospital Stay (HOSPITAL_BASED_OUTPATIENT_CLINIC_OR_DEPARTMENT_OTHER)
Admission: EM | Admit: 2022-09-03 | Discharge: 2022-09-05 | DRG: 282 | Disposition: A | Discharge status: Home / Self Care | Payer: Medicare Other | Attending: Cardiovascular Disease | Admitting: Cardiovascular Disease

## 2022-09-03 DIAGNOSIS — Z823 Family history of stroke: Secondary | ICD-10-CM | POA: Diagnosis not present

## 2022-09-03 DIAGNOSIS — Z833 Family history of diabetes mellitus: Secondary | ICD-10-CM

## 2022-09-03 DIAGNOSIS — I213 ST elevation (STEMI) myocardial infarction of unspecified site: Principal | ICD-10-CM

## 2022-09-03 DIAGNOSIS — E119 Type 2 diabetes mellitus without complications: Secondary | ICD-10-CM | POA: Diagnosis not present

## 2022-09-03 DIAGNOSIS — I214 Non-ST elevation (NSTEMI) myocardial infarction: Principal | ICD-10-CM | POA: Diagnosis present

## 2022-09-03 DIAGNOSIS — E785 Hyperlipidemia, unspecified: Secondary | ICD-10-CM | POA: Diagnosis present

## 2022-09-03 DIAGNOSIS — I2510 Atherosclerotic heart disease of native coronary artery without angina pectoris: Secondary | ICD-10-CM | POA: Diagnosis present

## 2022-09-03 DIAGNOSIS — I499 Cardiac arrhythmia, unspecified: Secondary | ICD-10-CM | POA: Diagnosis not present

## 2022-09-03 DIAGNOSIS — Z79899 Other long term (current) drug therapy: Secondary | ICD-10-CM | POA: Diagnosis not present

## 2022-09-03 DIAGNOSIS — I10 Essential (primary) hypertension: Secondary | ICD-10-CM | POA: Diagnosis present

## 2022-09-03 DIAGNOSIS — E7800 Pure hypercholesterolemia, unspecified: Secondary | ICD-10-CM | POA: Diagnosis not present

## 2022-09-03 DIAGNOSIS — R079 Chest pain, unspecified: Secondary | ICD-10-CM | POA: Diagnosis not present

## 2022-09-03 DIAGNOSIS — Z8249 Family history of ischemic heart disease and other diseases of the circulatory system: Secondary | ICD-10-CM | POA: Diagnosis not present

## 2022-09-03 DIAGNOSIS — Z90710 Acquired absence of both cervix and uterus: Secondary | ICD-10-CM

## 2022-09-03 DIAGNOSIS — Z809 Family history of malignant neoplasm, unspecified: Secondary | ICD-10-CM | POA: Diagnosis not present

## 2022-09-03 DIAGNOSIS — I249 Acute ischemic heart disease, unspecified: Secondary | ICD-10-CM | POA: Diagnosis not present

## 2022-09-03 DIAGNOSIS — Z87891 Personal history of nicotine dependence: Secondary | ICD-10-CM

## 2022-09-03 DIAGNOSIS — R9431 Abnormal electrocardiogram [ECG] [EKG]: Secondary | ICD-10-CM | POA: Diagnosis not present

## 2022-09-03 DIAGNOSIS — G4733 Obstructive sleep apnea (adult) (pediatric): Secondary | ICD-10-CM | POA: Diagnosis not present

## 2022-09-03 DIAGNOSIS — R0789 Other chest pain: Secondary | ICD-10-CM | POA: Diagnosis not present

## 2022-09-03 HISTORY — PX: LEFT HEART CATH AND CORONARY ANGIOGRAPHY: CATH118249

## 2022-09-03 LAB — CBC WITH DIFFERENTIAL/PLATELET
Abs Immature Granulocytes: 0.03 10*3/uL (ref 0.00–0.07)
Basophils Absolute: 0.1 10*3/uL (ref 0.0–0.1)
Basophils Relative: 1 %
Eosinophils Absolute: 0.3 10*3/uL (ref 0.0–0.5)
Eosinophils Relative: 4 %
HCT: 45 % (ref 36.0–46.0)
Hemoglobin: 15.3 g/dL — ABNORMAL HIGH (ref 12.0–15.0)
Immature Granulocytes: 0 %
Lymphocytes Relative: 39 %
Lymphs Abs: 3.2 10*3/uL (ref 0.7–4.0)
MCH: 30 pg (ref 26.0–34.0)
MCHC: 34 g/dL (ref 30.0–36.0)
MCV: 88.2 fL (ref 80.0–100.0)
Monocytes Absolute: 0.7 10*3/uL (ref 0.1–1.0)
Monocytes Relative: 8 %
Neutro Abs: 3.8 10*3/uL (ref 1.7–7.7)
Neutrophils Relative %: 48 %
Platelets: 319 10*3/uL (ref 150–400)
RBC: 5.1 MIL/uL (ref 3.87–5.11)
RDW: 12.9 % (ref 11.5–15.5)
WBC: 8.1 10*3/uL (ref 4.0–10.5)
nRBC: 0 % (ref 0.0–0.2)

## 2022-09-03 LAB — BASIC METABOLIC PANEL
Anion gap: 8 (ref 5–15)
BUN: 14 mg/dL (ref 8–23)
CO2: 27 mmol/L (ref 22–32)
Calcium: 9 mg/dL (ref 8.9–10.3)
Chloride: 102 mmol/L (ref 98–111)
Creatinine, Ser: 0.55 mg/dL (ref 0.44–1.00)
GFR, Estimated: >60 mL/min (ref >60)
Glucose, Bld: 124 mg/dL — ABNORMAL HIGH (ref 70–99)
Potassium: 3.7 mmol/L (ref 3.5–5.1)
Sodium: 137 mmol/L (ref 135–145)

## 2022-09-03 LAB — TROPONIN I (HIGH SENSITIVITY)
Troponin I (High Sensitivity): 17 ng/L (ref <18)
Troponin I (High Sensitivity): 269 ng/L (ref <18)
Troponin I (High Sensitivity): 849 ng/L (ref <18)

## 2022-09-03 LAB — CBC
HCT: 45.8 % (ref 36.0–46.0)
Hemoglobin: 15.6 g/dL — ABNORMAL HIGH (ref 12.0–15.0)
MCH: 30.5 pg (ref 26.0–34.0)
MCHC: 34.1 g/dL (ref 30.0–36.0)
MCV: 89.5 fL (ref 80.0–100.0)
Platelets: 270 10*3/uL (ref 150–400)
RBC: 5.12 MIL/uL — ABNORMAL HIGH (ref 3.87–5.11)
RDW: 12.8 % (ref 11.5–15.5)
WBC: 10 10*3/uL (ref 4.0–10.5)
nRBC: 0 % (ref 0.0–0.2)

## 2022-09-03 LAB — CREATININE, SERUM
Creatinine, Ser: 0.53 mg/dL (ref 0.44–1.00)
GFR, Estimated: >60 mL/min (ref >60)

## 2022-09-03 SURGERY — LEFT HEART CATH AND CORONARY ANGIOGRAPHY
Anesthesia: LOCAL

## 2022-09-03 MED ORDER — HYDRALAZINE HCL 20 MG/ML IJ SOLN: 10.0000 mg | INTRAMUSCULAR | Status: AC | PRN

## 2022-09-03 MED ORDER — LABETALOL HCL 5 MG/ML IV SOLN: 10.0000 mg | INTRAVENOUS | Status: AC | PRN

## 2022-09-03 MED ORDER — METOPROLOL TARTRATE 12.5 MG HALF TABLET
12.5000 mg | ORAL_TABLET | Freq: Two times a day (BID) | ORAL | Status: DC
  Administered 2022-09-03 – 2022-09-04 (×4): 12.5 mg via ORAL
  Filled 2022-09-03 (×4): qty 1

## 2022-09-03 MED ORDER — ONDANSETRON HCL 4 MG/2ML IJ SOLN: 4.0000 mg | Freq: Four times a day (QID) | INTRAMUSCULAR | Status: DC | PRN

## 2022-09-03 MED ORDER — ONDANSETRON HCL 4 MG/2ML IJ SOLN
4.0000 mg | Freq: Once | INTRAMUSCULAR | Status: AC
  Administered 2022-09-03: 4 mg via INTRAVENOUS
  Filled 2022-09-03: qty 2

## 2022-09-03 MED ORDER — ACETAMINOPHEN 325 MG PO TABS
650.0000 mg | ORAL_TABLET | ORAL | Status: DC | PRN
  Administered 2022-09-03 – 2022-09-04 (×6): 650 mg via ORAL
  Filled 2022-09-03 (×6): qty 2

## 2022-09-03 MED ORDER — HEPARIN (PORCINE) IN NACL 1000-0.9 UT/500ML-% IV SOLN
INTRAVENOUS | Status: DC | PRN
  Administered 2022-09-03 (×2): 500 mL

## 2022-09-03 MED ORDER — HEPARIN SODIUM (PORCINE) 1000 UNIT/ML IJ SOLN
INTRAMUSCULAR | Status: AC
  Filled 2022-09-03: qty 10

## 2022-09-03 MED ORDER — MIDAZOLAM HCL 2 MG/2ML IJ SOLN
INTRAMUSCULAR | Status: DC | PRN
  Administered 2022-09-03: 2 mg via INTRAVENOUS

## 2022-09-03 MED ORDER — SODIUM CHLORIDE 0.9 % IV SOLN: INTRAVENOUS | Status: AC

## 2022-09-03 MED ORDER — SODIUM CHLORIDE 0.9% FLUSH: 3.0000 mL | INTRAVENOUS | Status: DC | PRN

## 2022-09-03 MED ORDER — LIDOCAINE HCL (PF) 1 % IJ SOLN
INTRAMUSCULAR | Status: AC
  Filled 2022-09-03: qty 30

## 2022-09-03 MED ORDER — FENTANYL CITRATE PF 50 MCG/ML IJ SOSY
50.0000 ug | PREFILLED_SYRINGE | Freq: Once | INTRAMUSCULAR | Status: AC
  Administered 2022-09-03: 50 ug via INTRAVENOUS
  Filled 2022-09-03: qty 1

## 2022-09-03 MED ORDER — ATORVASTATIN CALCIUM 80 MG PO TABS
80.0000 mg | ORAL_TABLET | Freq: Every day | ORAL | Status: DC
  Administered 2022-09-03 – 2022-09-05 (×3): 80 mg via ORAL
  Filled 2022-09-03 (×3): qty 1

## 2022-09-03 MED ORDER — NITROGLYCERIN 0.4 MG SL SUBL
0.4000 mg | SUBLINGUAL_TABLET | SUBLINGUAL | Status: DC | PRN
  Administered 2022-09-03 (×3): 0.4 mg via SUBLINGUAL
  Filled 2022-09-03 (×2): qty 1

## 2022-09-03 MED ORDER — SODIUM CHLORIDE 0.9% FLUSH
3.0000 mL | Freq: Two times a day (BID) | INTRAVENOUS | Status: DC
  Administered 2022-09-03 – 2022-09-05 (×5): 3 mL via INTRAVENOUS

## 2022-09-03 MED ORDER — MORPHINE SULFATE (PF) 2 MG/ML IV SOLN
2.0000 mg | Freq: Once | INTRAVENOUS | Status: AC
  Administered 2022-09-03: 2 mg via INTRAVENOUS
  Filled 2022-09-03: qty 1

## 2022-09-03 MED ORDER — ASPIRIN 81 MG PO CHEW
324.0000 mg | CHEWABLE_TABLET | ORAL | Status: AC
  Administered 2022-09-03: 324 mg via ORAL
  Filled 2022-09-03: qty 4

## 2022-09-03 MED ORDER — HEPARIN (PORCINE) 25000 UT/250ML-% IV SOLN
INTRAVENOUS | Status: AC
  Administered 2022-09-03: 4000 [IU] via INTRAVENOUS
  Filled 2022-09-03: qty 250

## 2022-09-03 MED ORDER — IOHEXOL 350 MG/ML SOLN
INTRAVENOUS | Status: DC | PRN
  Administered 2022-09-03: 80 mL

## 2022-09-03 MED ORDER — FENTANYL CITRATE (PF) 100 MCG/2ML IJ SOLN
INTRAMUSCULAR | Status: AC
  Filled 2022-09-03: qty 2

## 2022-09-03 MED ORDER — SODIUM CHLORIDE 0.9 % IV SOLN: 250.0000 mL | INTRAVENOUS | Status: DC | PRN

## 2022-09-03 MED ORDER — VERAPAMIL HCL 2.5 MG/ML IV SOLN
INTRAVENOUS | Status: AC
  Filled 2022-09-03: qty 2

## 2022-09-03 MED ORDER — VERAPAMIL HCL 2.5 MG/ML IV SOLN
INTRAVENOUS | Status: DC | PRN
  Administered 2022-09-03: 10 mL via INTRA_ARTERIAL

## 2022-09-03 MED ORDER — LIDOCAINE HCL (PF) 1 % IJ SOLN
INTRAMUSCULAR | Status: DC | PRN
  Administered 2022-09-03: 2 mL via INTRADERMAL

## 2022-09-03 MED ORDER — FENTANYL CITRATE (PF) 100 MCG/2ML IJ SOLN
INTRAMUSCULAR | Status: DC | PRN
  Administered 2022-09-03: 25 ug via INTRAVENOUS

## 2022-09-03 MED ORDER — MIDAZOLAM HCL 2 MG/2ML IJ SOLN
INTRAMUSCULAR | Status: AC
  Filled 2022-09-03: qty 2

## 2022-09-03 MED ORDER — ASPIRIN 81 MG PO CHEW
81.0000 mg | CHEWABLE_TABLET | Freq: Every day | ORAL | Status: DC
  Administered 2022-09-04 – 2022-09-05 (×2): 81 mg via ORAL
  Filled 2022-09-03 (×2): qty 1

## 2022-09-03 MED ORDER — ISOSORBIDE MONONITRATE ER 30 MG PO TB24
30.0000 mg | ORAL_TABLET | Freq: Every day | ORAL | Status: DC
  Administered 2022-09-03 – 2022-09-05 (×3): 30 mg via ORAL
  Filled 2022-09-03 (×3): qty 1

## 2022-09-03 MED ORDER — HYDRALAZINE HCL 20 MG/ML IJ SOLN
2.0000 mg | Freq: Once | INTRAMUSCULAR | Status: AC
  Administered 2022-09-03: 2 mg via INTRAVENOUS

## 2022-09-03 MED ORDER — HYDROCORTISONE 1 % EX CREA
1.0000 | TOPICAL_CREAM | Freq: Three times a day (TID) | CUTANEOUS | Status: DC | PRN
  Filled 2022-09-03: qty 28

## 2022-09-03 MED ORDER — HYDRALAZINE HCL 20 MG/ML IJ SOLN
INTRAMUSCULAR | Status: AC
  Filled 2022-09-03: qty 1

## 2022-09-03 MED ORDER — ENOXAPARIN SODIUM 40 MG/0.4ML IJ SOSY
40.0000 mg | PREFILLED_SYRINGE | INTRAMUSCULAR | Status: DC
  Administered 2022-09-04 – 2022-09-05 (×2): 40 mg via SUBCUTANEOUS
  Filled 2022-09-03: qty 0.4

## 2022-09-03 MED ORDER — HEPARIN BOLUS VIA INFUSION: 4000.0000 [IU] | Freq: Once | INTRAVENOUS | Status: AC

## 2022-09-03 MED ORDER — HEPARIN SODIUM (PORCINE) 1000 UNIT/ML IJ SOLN
INTRAMUSCULAR | Status: DC | PRN
  Administered 2022-09-03: 6000 [IU] via INTRAVENOUS

## 2022-09-03 SURGICAL SUPPLY — 12 items
CATH INFINITI JR4 5F (CATHETERS) IMPLANT
CATH OPTITORQUE TIG 4.0 5F (CATHETERS) IMPLANT
DEVICE RAD COMP TR BAND LRG (VASCULAR PRODUCTS) IMPLANT
ELECT DEFIB PAD ADLT CADENCE (PAD) IMPLANT
GLIDESHEATH SLEND SS 6F .021 (SHEATH) IMPLANT
GUIDEWIRE INQWIRE 1.5J.035X260 (WIRE) IMPLANT
INQWIRE 1.5J .035X260CM (WIRE) ×1
KIT HEART LEFT (KITS) ×1 IMPLANT
PACK CARDIAC CATHETERIZATION (CUSTOM PROCEDURE TRAY) ×1 IMPLANT
SHEATH PROBE COVER 6X72 (BAG) IMPLANT
TRANSDUCER W/STOPCOCK (MISCELLANEOUS) ×1 IMPLANT
TUBING CIL FLEX 10 FLL-RA (TUBING) ×1 IMPLANT

## 2022-09-03 NOTE — ED Triage Notes (Signed)
Pt arrives with reports of chest pain over the last two hours. Pt reports left side pain and pain to neck and jaw.

## 2022-09-03 NOTE — ED Notes (Signed)
Asked patient to remove her full upper and lower dentures. Pt refused at this time, stating she does not want to remove them "until the last second".

## 2022-09-03 NOTE — H&P (Addendum)
Cardiology Admission History and Physical   Patient ID: Jeanette Mckee MRN: [id number]; DOB: August 09, 1951   Admission date: 09/03/2022  PCP:  Pearline Cables, MD   [facility] Providers Cardiologist:       Chief Complaint: Chest pain  Patient Profile:   Jeanette Mckee is a 71 y.o. female with past medical history of T2DM who is being seen 09/03/2022 for the evaluation of chest pain.  History of Present Illness:   Jeanette Mckee states sudden onset midsternal chest tightness radiating to her left arm and jaw while she was sleeping around 4 AM today.  She also reported associated symptoms of diaphoresis, nausea and vomiting.  Patient presented at the local ER and was found to have a STEMI and transferred here for further evaluation.  Patient is seen in the ED, still complain chest pain, hemodynamic stable. Denies fever, chills, dizziness, syncope, heart palpitations, dyspnea, nausea, vomiting, dysuria, diarrhea, pedal edema or any bleeding events.  Denies history of smoking or drinking.  Reports family history of immature CAD.  Past Medical History:  Diagnosis Date   Headache    History of chicken pox    childhood   Hypertension    Measles as a child   Other and unspecified hyperlipidemia 02/11/2013   Preventative health care 02/11/2013    Past Surgical History:  Procedure Laterality Date   ABDOMINAL HYSTERECTOMY  12/1994   and RSO & LS     Medications Prior to Admission: Prior to Admission medications   Medication Sig Start Date End Date Taking? Authorizing Provider  amLODipine (NORVASC) 5 MG tablet Take 1 tablet (5 mg total) by mouth daily. 03/16/22   Copland, Gwenlyn Found, MD  estradiol (ESTRACE) 0.1 MG/GM vaginal cream Place 1 Applicatorful vaginally 2 (two) times a week. 03/17/22   Copland, Gwenlyn Found, MD  fluticasone (FLONASE) 50 MCG/ACT nasal spray Place 2 sprays into both nostrils daily. 04/22/21   Margaretann Loveless, PA-C  lisinopril-hydrochlorothiazide  (ZESTORETIC) 20-12.5 MG tablet Take 2 tablets by mouth daily. 03/16/22   Copland, Gwenlyn Found, MD     Allergies:   No Known Allergies  Social History:   Social History   Socioeconomic History   Marital status: Married    Spouse name: Not on file   Number of children: Not on file   Years of education: Not on file   Highest education level: Not on file  Occupational History   Not on file  Tobacco Use   Smoking status: Former    Years: 20.00    Types: Cigarettes    Quit date: 11/13/2001    Years since quitting: 20.8   Smokeless tobacco: Never   Tobacco comments:    quit in 2003 1 ppd for 20 years  Vaping Use   Vaping Use: Not on file  Substance and Sexual Activity   Alcohol use: Yes    Comment: WINE FRIDAY AND SATURDAY   Drug use: No   Sexual activity: Yes    Comment: lives with husband, retired from church, [place]. eats a heart healthy diet, minimal meats.exercises 6 days a week  Other Topics Concern   Not on file  Social History Narrative   Not on file   Social Determinants of Health   Financial Resource Strain: Low Risk  (09/22/2021)   Overall Financial Resource Strain (CARDIA)    Difficulty of Paying Living Expenses: Not hard at all  Food Insecurity: No Food Insecurity (09/22/2021)   Hunger Vital Sign  Worried About Charity fundraiser in the Last Year: Never true    [place] in the Last Year: Never true  Transportation Needs: No Transportation Needs (09/22/2021)   PRAPARE - Hydrologist (Medical): No    Lack of Transportation (Non-Medical): No  Physical Activity: Insufficiently Active (09/22/2021)   Exercise Vital Sign    Days of Exercise per Week: 7 days    Minutes of Exercise per Session: 20 min  Stress: No Stress Concern Present (09/22/2021)   [place]    Feeling of Stress : Not at all  Social Connections: [place] (09/22/2021)   Social Connection  and Isolation Panel [NHANES]    Frequency of Communication with Friends and Family: More than three times a week    Frequency of Social Gatherings with Friends and Family: More than three times a week    Attends Religious Services: More than 4 times per year    Active Member of [employer] or Organizations: Yes    Attends Music therapist: More than 4 times per year    Marital Status: Married  Human resources officer Violence: Not At Risk (09/22/2021)   Humiliation, Afraid, Rape, and Kick questionnaire    Fear of Current or Ex-Partner: No    Emotionally Abused: No    Physically Abused: No    Sexually Abused: No    Family History:   The patient's family history includes Alcohol abuse in her father; Arthritis in her mother; Cancer in her father and maternal grandmother; Crohn's disease in her mother; Diabetes in her father; Heart attack (age of onset: 7) in her mother; Heart disease in her father, mother, and son; Hypertension in her father and mother; Stroke in her father. There is no history of Breast cancer or Colon cancer.    ROS:  Please see the history of present illness.  All other ROS reviewed and negative.     Physical Exam/Data:   Vitals:   09/03/22 0615 09/03/22 0618 09/03/22 0620 09/03/22 0625  BP: (!) 178/81  (!) 179/75 (!) 175/108  Pulse: 71 69 66 71  Resp: 13 17 14 20   Temp:      SpO2: 98% 98% 98% 98%   No intake or output data in the 24 hours ending 09/03/22 0709    03/16/2022    8:17 AM 09/22/2021    8:21 AM 08/10/2021    9:56 AM  Last 3 Weights  Weight (lbs) 191 lb 12.8 oz 178 lb 190 lb  Weight (kg) 87 kg 80.74 kg 86.183 kg     There is no height or weight on file to calculate BMI.  General:  Well nourished, well developed, in no acute distress HEENT: normal Neck: no JVD Vascular: No carotid bruits; Distal pulses 2+ bilaterally   Cardiac:  normal S1, S2; RRR; no murmur  Lungs:  clear to auscultation bilaterally, no wheezing, rhonchi or rales  Abd: soft,  nontender, no hepatomegaly  Ext: no edema Musculoskeletal:  No deformities, BUE and BLE strength normal and equal Skin: warm and dry  Neuro:  CNs 2-12 intact, no focal abnormalities noted Psych:  Normal affect    Relevant CV Studies:   Laboratory Data:  High Sensitivity Troponin:   Recent Labs  Lab 09/03/22 0600  TROPONINIHS 17      Chemistry Recent Labs  Lab 09/03/22 0600  NA 137  K 3.7  CL 102  CO2 27  GLUCOSE 124*  BUN 14  CREATININE 0.55  CALCIUM 9.0  GFRNONAA >60  ANIONGAP 8    No results for input(s): "PROT", "ALBUMIN", "AST", "ALT", "ALKPHOS", "BILITOT" in the last 168 hours. Lipids No results for input(s): "CHOL", "TRIG", "HDL", "LABVLDL", "LDLCALC", "CHOLHDL" in the last 168 hours. Hematology Recent Labs  Lab 09/03/22 0600  WBC 8.1  RBC 5.10  HGB 15.3*  HCT 45.0  MCV 88.2  MCH 30.0  MCHC 34.0  RDW 12.9  PLT 319   Thyroid No results for input(s): "TSH", "FREET4" in the last 168 hours. BNPNo results for input(s): "BNP", "PROBNP" in the last 168 hours.  DDimer No results for input(s): "DDIMER" in the last 168 hours.   Radiology/Studies:  DG Chest Portable 1 View  Result Date: 09/03/2022 CLINICAL DATA:  Chest pain for 2 hours EXAM: PORTABLE CHEST 1 VIEW COMPARISON:  None Available. FINDINGS: Artifact from EKG leads and defibrillator pads. Normal heart size and mediastinal contours. No acute infiltrate or edema. No effusion or pneumothorax. No acute osseous findings. IMPRESSION: Negative portable chest. Electronically Signed   By: Tiburcio Pea M.D.   On: 09/03/2022 06:22     Assessment and Plan:   #STEMI -ECG showed STE in lead I/aVL -emergent coronary angiogram -Acquire a TTE -Further management depending on coronary angiogram results   Risk Assessment/Risk Scores:    TIMI Risk Score for ST  Elevation MI:   The patient's TIMI risk score is 4, which indicates a 7.3% risk of all cause mortality at 30 days.     Severity of  Illness: The appropriate patient status for this patient is INPATIENT. Inpatient status is judged to be reasonable and necessary in order to provide the required intensity of service to ensure the patient's safety. The patient's presenting symptoms, physical exam findings, and initial radiographic and laboratory data in the context of their chronic comorbidities is felt to place them at high risk for further clinical deterioration. Furthermore, it is not anticipated that the patient will be medically stable for discharge from the hospital within 2 midnights of admission.   * I certify that at the point of admission it is my clinical judgment that the patient will require inpatient hospital care spanning beyond 2 midnights from the point of admission due to high intensity of service, high risk for further deterioration and high frequency of surveillance required.*   For questions or updates, please contact [facility] Please consult [email or web address] for contact info under     Signed, Filiberto Pinks, MD  09/03/2022 7:09 AM    Patient seen and examined. Agree with assessment and plan.  Lorynn Moeser is a 71 year old female who has a history of hypertension and currently has been treated with amlodipine 5 mg and lisinopril HCT.  He has a remote history of tobacco use and quit smoking in November 13, 2001.  She has been followed by Dr. Dallas Schimke.  She exercises regularly but has noticed some recent increase in shortness of breath.  This morning she was awakened at 4 AM with substernal chest tightness.  Her symptoms progressed and were more severe at 5 AM leading to her [facility] evaluation.  Her ECG upon arrival showed 1 mm J-point elevation In leads I and aVL with T wave inversion in leads III and aVF.  A code STEMI was activated.  Initial troponin was 17.  She received heparin 4000 units.  In transit to [facility] she received fentanyl 50 mg.  Upon arrival to  [facility], she was completely  pain-free.  She was taken to the catheterization laboratory.  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  There was mild AV groove stenoses of 40%.  There appeared to be a suggestion of faint collateralization to very small vessels from the most distal posterolateral vessel.  The RCA was diminutive and nondominant and although had 40 to 85% stenosis vessel caliber rather than the 5 [place] agnostic catheter.  With her pain-free and stable hemodynamics, an initial medical therapy trial will be recommended with addition of low-dose nitrates and beta-blocker therapy with continuation of our amlodipine and lisinopril HCT.  We will obtain a 2D echo Doppler study today for improved visualization of LV function and wall motion.  Initiate aggressive lipid-lowering therapy with target LDL less than 70.  We will cycle troponin.  Lennette Bihari, MD, [facility] 09/03/2022 9:27 AM

## 2022-09-03 NOTE — Progress Notes (Signed)
Pt's TR band came out with no complications, pt is up and moving and ambulating in a hallway. Pt is in RA, refusing chest pain and distress after one episode in morning. Pt's husband is in bed side Will continue to monitor the patient  Palma Holter, RN

## 2022-09-03 NOTE — Care Management (Signed)
  Transition of Care [facility]) Screening Note   Patient Details  Name: Jeanette Mckee Date of Birth: [birth year]   Transition of Care [facility]) CM/SW Contact:    Bethena Roys, RN Phone Number: 09/03/2022, 9:43 AM    Transition of Care Department [facility]) has reviewed the patient and no TOC needs have been identified at this time. Patient post LHC today. Case Manager will continue to monitor patient advancement through interdisciplinary progression rounds. If new patient transition needs arise, please place a TOC consult.

## 2022-09-03 NOTE — ED Provider Notes (Addendum)
[place] EMERGENCY DEPARTMENT Provider Note   CSN: [id number] Arrival date & time: 09/03/22  0545     History  Chief Complaint  Patient presents with   Chest Pain    Jeanette Mckee is a 71 y.o. female.  The history is provided by the patient.  Chest Pain Pain location:  L chest Pain quality: pressure   Pain radiates to:  L shoulder and L jaw Pain severity:  Severe Onset quality:  Sudden Duration: 1.5 hours. Timing:  Constant Progression:  Worsening Chronicity:  New Context: at rest   Relieved by:  Nothing Worsened by:  Nothing Ineffective treatments:  None tried Associated symptoms: diaphoresis, nausea and shortness of breath   Risk factors: high cholesterol and hypertension   Risk factors: not female   Patient with HTN and elevated lipid panel in May 2023 whose sister in law is a cardiac nurse at [employer] who presents with severe Left sided chest pressure with radiation to the L jaw and L shoulder and and SOB and diaphoresis and some nausea that started within the last 2 hours. Patient states her friend just had a heart attack and had the same symptoms so when this started she knew she needed to come in.        Home Medications Prior to Admission medications   Medication Sig Start Date End Date Taking? Authorizing Provider  amLODipine (NORVASC) 5 MG tablet Take 1 tablet (5 mg total) by mouth daily. 03/16/22   Copland, Gay Filler, MD  estradiol (ESTRACE) 0.1 MG/GM vaginal cream Place 1 Applicatorful vaginally 2 (two) times a week. 03/17/22   Copland, Gay Filler, MD  fluticasone (FLONASE) 50 MCG/ACT nasal spray Place 2 sprays into both nostrils daily. 04/22/21   Mar Daring, PA-C  lisinopril-hydrochlorothiazide (ZESTORETIC) 20-12.5 MG tablet Take 2 tablets by mouth daily. 03/16/22   Copland, Gay Filler, MD      Allergies    Patient has no known allergies.    Review of Systems   Review of Systems  Constitutional:  Positive for diaphoresis.  HENT:  Negative  for facial swelling.   Eyes:  Negative for redness.  Respiratory:  Positive for shortness of breath.   Cardiovascular:  Positive for chest pain.  Gastrointestinal:  Positive for nausea.  All other systems reviewed and are negative.   Physical Exam Updated Vital Signs Pulse 80   Temp (!) 97.1 F (36.2 C)   Resp (!) 22   SpO2 97%  Physical Exam Vitals and nursing note reviewed. Exam conducted with a chaperone present.  Constitutional:      General: She is in acute distress.     Appearance: Normal appearance. She is diaphoretic.  HENT:     Nose: Nose normal.  Eyes:     Conjunctiva/sclera: Conjunctivae normal.     Pupils: Pupils are equal, round, and reactive to light.  Cardiovascular:     Rate and Rhythm: Normal rate and regular rhythm.     Pulses: Normal pulses.     Heart sounds: Normal heart sounds.  Pulmonary:     Effort: Pulmonary effort is normal.     Breath sounds: Normal breath sounds. No wheezing.  Abdominal:     General: Bowel sounds are normal.     Palpations: Abdomen is soft.     Tenderness: There is no abdominal tenderness. There is no guarding.  Musculoskeletal:        General: Normal range of motion.     Cervical back: Normal range  of motion and neck supple.  Skin:    General: Skin is warm.     Capillary Refill: Capillary refill takes less than 2 seconds.  Neurological:     General: No focal deficit present.     Mental Status: She is alert and oriented to person, place, and time.     Deep Tendon Reflexes: Reflexes normal.  Psychiatric:        Mood and Affect: Mood normal.        Behavior: Behavior normal.     ED Results / Procedures / Treatments   Labs (all labs ordered are listed, but only abnormal results are displayed) Results for orders placed or performed during the hospital encounter of 09/03/22  CBC with Differential  Result Value Ref Range   WBC 8.1 4.0 - 10.5 K/uL   RBC 5.10 3.87 - 5.11 MIL/uL   Hemoglobin 15.3 (H) 12.0 - 15.0 g/dL    HCT 40.945.0 81.136.0 - 91.446.0 %   MCV 88.2 80.0 - 100.0 fL   MCH 30.0 26.0 - 34.0 pg   MCHC 34.0 30.0 - 36.0 g/dL   RDW 78.212.9 95.611.5 - 21.315.5 %   Platelets 319 150 - 400 K/uL   nRBC 0.0 0.0 - 0.2 %   Neutrophils Relative % 48 %   Neutro Abs 3.8 1.7 - 7.7 K/uL   Lymphocytes Relative 39 %   Lymphs Abs 3.2 0.7 - 4.0 K/uL   Monocytes Relative 8 %   Monocytes Absolute 0.7 0.1 - 1.0 K/uL   Eosinophils Relative 4 %   Eosinophils Absolute 0.3 0.0 - 0.5 K/uL   Basophils Relative 1 %   Basophils Absolute 0.1 0.0 - 0.1 K/uL   Immature Granulocytes 0 %   Abs Immature Granulocytes 0.03 0.00 - 0.07 K/uL   No results found.  EKG EKG Interpretation  Date/Time:  Sunday September 03 2022 06:00:01 EDT Ventricular Rate:  73 PR Interval:  134 QRS Duration: 91 QT Interval:  452 QTC Calculation: 499 R Axis:   -51 Text Interpretation: Sinus rhythm ** ** ACUTE MI / STEMI ** ** Confirmed by Rock Sobol ([id number]) on 09/03/2022 6:00:58 AM  Radiology No results found.  Procedures Procedures    Medications Ordered in ED Medications  ondansetron (ZOFRAN) injection 4 mg (has no administration in time range)  heparin bolus via infusion 4,000 Units (has no administration in time range)  fentaNYL (SUBLIMAZE) injection 50 mcg (has no administration in time range)  heparin [id number] UT/250ML infusion (has no administration in time range)  aspirin chewable tablet 324 mg (324 mg Oral Given 09/03/22 0601)    ED Course/ Medical Decision Making/ A&P                           Medical Decision Making Patient with sudden onset L CP with radiation to the L shoulder and jaw at rest, worsening.    Amount and/or Complexity of Data Reviewed External Data Reviewed: notes.    Details: Previous notes reviewed  Labs: ordered.    Details: All labs reviewed:  normal white count 8.1, hemoglobin 15.3, normal platelet count.  Troponins ordered and pending at the time of transfer  Radiology: ordered and independent interpretation  performed.    Details: No acute finding on CXR by me  ECG/medicine tests: ordered and independent interpretation performed. Decision-making details documented in ED Course. Discussion of management or test interpretation with external provider(s): Case d/w Dr. Tresa EndoKelly of cardiology who will accept  the patient Case d/w Dr. Leonette Monarch who will accept the patient   Risk OTC drugs. Prescription drug management. Parenteral controlled substances. Decision regarding hospitalization. Risk Details: Patient will be transferred by EMS to [facility] ED for cardiac catheterization.  Aspirin chewed in the ED, fentanyl given, heparin bolus given and heparin drip initiated by me in the ED.  CXR and full labs sent.    Critical Care Total time providing critical care: 30 minutes (Evaluation, complexity of diagnosis and treatment modalities. Initiation of heparin drip by me.  )    Final Clinical Impression(s) / ED Diagnoses Final diagnoses:  ST elevation myocardial infarction (STEMI), unspecified artery ([place])   The patient appears reasonably stabilized for admission considering the current resources, flow, and capabilities available in the ED at this time, and I doubt any other [facility] requiring further screening and/or treatment in the ED prior to admission.      Miakoda Mcmillion, MD 09/03/22 0630

## 2022-09-03 NOTE — ED Notes (Signed)
Pt transported to [facility] ER with [facility]. Charge RN made aware. Pt alert and oriented with VSS.

## 2022-09-04 ENCOUNTER — Encounter (HOSPITAL_COMMUNITY): Payer: Self-pay | Admitting: Cardiovascular Disease

## 2022-09-04 ENCOUNTER — Inpatient Hospital Stay (HOSPITAL_COMMUNITY): Payer: Medicare Other

## 2022-09-04 DIAGNOSIS — I249 Acute ischemic heart disease, unspecified: Secondary | ICD-10-CM

## 2022-09-04 DIAGNOSIS — E785 Hyperlipidemia, unspecified: Secondary | ICD-10-CM

## 2022-09-04 DIAGNOSIS — I214 Non-ST elevation (NSTEMI) myocardial infarction: Secondary | ICD-10-CM | POA: Diagnosis not present

## 2022-09-04 DIAGNOSIS — I10 Essential (primary) hypertension: Secondary | ICD-10-CM | POA: Diagnosis not present

## 2022-09-04 LAB — BASIC METABOLIC PANEL
Anion gap: 10 (ref 5–15)
BUN: 15 mg/dL (ref 8–23)
CO2: 24 mmol/L (ref 22–32)
Calcium: 8.5 mg/dL — ABNORMAL LOW (ref 8.9–10.3)
Chloride: 101 mmol/L (ref 98–111)
Creatinine, Ser: 0.67 mg/dL (ref 0.44–1.00)
GFR, Estimated: >60 mL/min (ref >60)
Glucose, Bld: 118 mg/dL — ABNORMAL HIGH (ref 70–99)
Potassium: 3.6 mmol/L (ref 3.5–5.1)
Sodium: 135 mmol/L (ref 135–145)

## 2022-09-04 LAB — ECHOCARDIOGRAM COMPLETE
AR max vel: 2.57 cm2
AV Area VTI: 2.7 cm2
AV Area mean vel: 2.61 cm2
AV Mean grad: 2 mmHg
AV Peak grad: 4.3 mmHg
Ao pk vel: 1.04 m/s
Area-P 1/2: 2.84 cm2
S' Lateral: 3.2 cm

## 2022-09-04 LAB — CBC
HCT: 37.9 % (ref 36.0–46.0)
Hemoglobin: 12.6 g/dL (ref 12.0–15.0)
MCH: 29.6 pg (ref 26.0–34.0)
MCHC: 33.2 g/dL (ref 30.0–36.0)
MCV: 89.2 fL (ref 80.0–100.0)
Platelets: 283 10*3/uL (ref 150–400)
RBC: 4.25 MIL/uL (ref 3.87–5.11)
RDW: 12.9 % (ref 11.5–15.5)
WBC: 9.7 10*3/uL (ref 4.0–10.5)
nRBC: 0 % (ref 0.0–0.2)

## 2022-09-04 MED ORDER — POTASSIUM CHLORIDE CRYS ER 20 MEQ PO TBCR
40.0000 meq | EXTENDED_RELEASE_TABLET | Freq: Once | ORAL | Status: AC
  Administered 2022-09-04: 40 meq via ORAL
  Filled 2022-09-04: qty 2

## 2022-09-04 NOTE — Plan of Care (Signed)

## 2022-09-04 NOTE — Progress Notes (Signed)
  Echocardiogram 2D Echocardiogram has been performed.  Jeanette Mckee 09/04/2022, 10:50 AM

## 2022-09-04 NOTE — Progress Notes (Signed)
Progress Note  Patient Name: Jeanette Mckee Date of Encounter: 09/04/2022  Primary Cardiologist: new; Dr. Tresa Endo  Subjective   Feels much better.  No recurrent chest pain.  No present shortness of breath.  No edema  Inpatient Medications    Scheduled Meds:  aspirin  81 mg Oral Daily   atorvastatin  80 mg Oral Daily   enoxaparin (LOVENOX) injection  40 mg Subcutaneous Q24H   isosorbide mononitrate  30 mg Oral Daily   metoprolol tartrate  12.5 mg Oral BID   sodium chloride flush  3 mL Intravenous Q12H   Continuous Infusions:  sodium chloride     PRN Meds: sodium chloride, acetaminophen, hydrocortisone cream, nitroGLYCERIN, ondansetron (ZOFRAN) IV, sodium chloride flush   Vital Signs    Vitals:   09/03/22 2309 09/04/22 0331 09/04/22 0828 09/04/22 1020  BP: 138/65 (!) 156/73 (!) 176/73   Pulse: 63 65 63 66  Resp: 15 20 19    Temp: 98.5 F (36.9 C) 98.3 F (36.8 C) 98.4 F (36.9 C)   TempSrc: Oral Oral Oral   SpO2: 91% 93% 95%     Intake/Output Summary (Last 24 hours) at 09/04/2022 1056 Last data filed at 09/04/2022 0833 Gross per 24 hour  Intake 720 ml  Output 240 ml  Net 480 ml    I/O since admission: +720    Telemetry    Sinus in the 60s- Personally Reviewed  ECG    ECG (independently read by me): NSR at 64, no J point elevation in I, aVL and previous marked T wave abnormality in lead III and aVF; now with mild T wave inversion V5-6  Physical Exam    BP (!) 176/73 (BP Location: Right Arm)   Pulse 66   Temp 98.4 F (36.9 C) (Oral)   Resp 19   SpO2 95%  General: Alert, oriented, no distress.  Skin: normal turgor, no rashes, warm and dry HEENT: Normocephalic, atraumatic. Pupils equal round and reactive to light; sclera anicteric; extraocular muscles intact;  Nose without nasal septal hypertrophy Mouth/Parynx benign; Mallinpatti scale Neck: No JVD, no carotid bruits; normal carotid upstroke Lungs: clear to ausculatation and percussion; no  wheezing or rales Chest wall: without tenderness to palpitation Heart: PMI not displaced, RRR, s1 s2 normal, 1/6 systolic murmur, no diastolic murmur, no rubs, gallops, thrills, or heaves Abdomen: soft, nontender; no hepatosplenomehaly, BS+; abdominal aorta nontender and not dilated by palpation. Back: no CVA tenderness Pulses 2+ Musculoskeletal: full range of motion, normal strength, no joint deformities Extremities: no clubbing cyanosis or edema, Homan's sign negative  Neurologic: grossly nonfocal; Cranial nerves grossly wnl Psychologic: Normal mood and affect    Labs    Chemistry Recent Labs  Lab 09/03/22 0600 09/03/22 0946 09/04/22 0027  NA 137  --  135  K 3.7  --  3.6  CL 102  --  101  CO2 27  --  24  GLUCOSE 124*  --  118*  BUN 14  --  15  CREATININE 0.55 0.53 0.67  CALCIUM 9.0  --  8.5*  GFRNONAA >60 >60 >60  ANIONGAP 8  --  10     Hematology Recent Labs  Lab 09/03/22 0600 09/03/22 0946 09/04/22 0027  WBC 8.1 10.0 9.7  RBC 5.10 5.12* 4.25  HGB 15.3* 15.6* 12.6  HCT 45.0 45.8 37.9  MCV 88.2 89.5 89.2  MCH 30.0 30.5 29.6  MCHC 34.0 34.1 33.2  RDW 12.9 12.8 12.9  PLT 319 270 283  HS Troponin: 17 > 269 > 849  Cardiac EnzymesNo results for input(s): "TROPONINI" in the last 168 hours. No results for input(s): "TROPIPOC" in the last 168 hours.   BNPNo results for input(s): "BNP", "PROBNP" in the last 168 hours.   DDimer No results for input(s): "DDIMER" in the last 168 hours.   Lipid Panel     Component Value Date/Time   CHOL 221 (H) 03/16/2022 0846   TRIG 62.0 03/16/2022 0846   HDL 83.10 03/16/2022 0846   CHOLHDL 3 03/16/2022 0846   VLDL 12.4 03/16/2022 0846   LDLCALC 125 (H) 03/16/2022 0846     Radiology    CARDIAC CATHETERIZATION  Result Date: 09/03/2022   Prox RCA to Mid RCA lesion is 85% stenosed.   Prox RCA lesion is 40% stenosed.   Prox LAD to Mid LAD lesion is 55% stenosed.   Mid LAD lesion is 40% stenosed.   Dist LAD lesion is 40%  stenosed.   Mid Cx lesion is 30% stenosed.   Dist Cx lesion is 40% stenosed.   2nd Diag-1 lesion is 30% stenosed.   2nd Diag-2 lesion is 20% stenosed.   Ost LAD to Prox LAD lesion is 20% stenosed.   The left ventricular ejection fraction is 45-50% by visual estimate. 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 Very large dominant left circumflex coronary artery with mild nonobstructive plaque in the AV groove circumflex.  The third marginal branch is diminutive.  The distal circumflex supplies four vessel supplying the posterior and inferolateral wall.  There is a suggestion of very distal diminutive vessel collateralization collateralization at the apex. Very small nondominant RCA with diffuse 40% proximal stenosis and 85% mid stenosis.  Vessel caliber appears taller than the 5 French (1.7 mm) diagnostic catheter. Mild LV dysfunction with suggestion of infero-apical hypocontractility.  LVEDP 28 mmHg. RECOMMENDATION: Initial attempt at medical therapy trial.  Plan 2D echo Doppler study today for further assessment of LV function.  Patient has been on amlodipine 5 mg and lisinopril HCT for blood pressure control; will add low-dose metoprolol tartrate and isosorbide.  Aggressive lipid management with target LDL less than 70.   DG Chest Portable 1 View  Result Date: 09/03/2022 CLINICAL DATA:  Chest pain for 2 hours EXAM: PORTABLE CHEST 1 VIEW COMPARISON:  None Available. FINDINGS: Artifact from EKG leads and defibrillator pads. Normal heart size and mediastinal contours. No acute infiltrate or edema. No effusion or pneumothorax. No acute osseous findings. IMPRESSION: Negative portable chest. Electronically Signed   By: Tiburcio Pea M.D.   On: 09/03/2022 06:22    Cardiac Studies   CARDIAC CATH: 09/02/2022   Prox RCA to Mid RCA lesion is 85% stenosed.   Prox RCA lesion is 40% stenosed.   Prox LAD to Mid LAD lesion is 55% stenosed.   Mid LAD lesion is 40%  stenosed.   Dist LAD lesion is 40% stenosed.   Mid Cx lesion is 30% stenosed.   Dist Cx lesion is 40% stenosed.   2nd Diag-1 lesion is 30% stenosed.   2nd Diag-2 lesion is 20% stenosed.   Ost LAD to Prox LAD lesion is 20% stenosed.   The left ventricular ejection fraction is 45-50% by visual estimate.   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   Very large dominant left circumflex coronary artery with mild nonobstructive plaque in the AV groove circumflex.  The third marginal branch is  diminutive.  The distal circumflex supplies four vessel supplying the posterior and inferolateral wall.  There is a suggestion of very distal diminutive vessel collateralization collateralization at the apex.   Very small nondominant RCA with diffuse 40% proximal stenosis and 85% mid stenosis.  Vessel caliber appears taller than the 5 French (1.7 mm) diagnostic catheter.   Mild LV dysfunction with suggestion of infero-apical hypocontractility.  LVEDP 28 mmHg.   RECOMMENDATION: Initial attempt at medical therapy trial.  Plan 2D echo Doppler study today for further assessment of LV function.  Patient has been on amlodipine 5 mg and lisinopril HCT for blood pressure control; will add low-dose metoprolol tartrate and isosorbide. Aggressive lipid management with target LDL less than 70.    Patient Profile     71 y.o. female who has a history of hypertension and has been on amlodipine 5 mg and lisinopril HCT prior to admission.  Has noticed some slight increase in shortness of breath over the past several weeks.  On October 21 she was awakened from sleep at 4 AM with development of chest pressure which persisted and became more intense leading to ER evaluation at [facility].  Initial ECG showed 1 mm J-point elevation in leads I and aVL with T wave inversion in leads III and aVF.  She was transferred and underwent emergent catheterization possible  STEMI.  Assessment & Plan    NSTEMI: Patient developed new onset chest pain while sleeping early Sunday morning.  Initial ECG showed 1 mm J-point elevation in lead I and aVL with T wave inversion in 3 and aVF.  Acute catheterization demonstrated dominant circumflex system, there was mild to moderate CAD in the LAD left circumflex which was a large dominant vessel and a nondominant system diminutive sized RVA had 85% mid stenosis.  She was pain-free on arrival to the hospital and remained pain-free throughout the procedure.  An initial medical therapy trial was recommended.  She was started on low-dose isosorbide and metoprolol tartrate yesterday.  He had been on amlodipine 5 mg and lisinopril HCTZ preadmission.  Loading has been resumed.  We will change lisinopril HCT to ARB therapy.  The echo Doppler study was just completed, results pending. Hypertension: Patient has been on amlodipine 5 mg and lisinopril-HCTZ/25 mg prior to admission.  Currently no edema, will change lisinopril HCT to losartan initiate 25 mg today with probable titration upward.  Depending upon LV function occasion adjustment will be made. Hyperlipidemia: LDL in May 2023 was elevated at 25.  She has not been on any therapy.  With multivessel CAD as noted above, atorvastatin 80 mg was initiated following her catheterization yesterday.  Aim for target LDL less than 70 and preferably less than 55. Evaluate for obstructive sleep apnea: The patient was awakened from sleep at 4 AM and symptoms continued until her ER evaluation.  I discussed with her concern for possible nocturnal hypoxemia contributing to her symptomatology but most likely she was in deep REM sleep which is usually associated with more severe sleep apnea c/w NREM sleep.  Discussed  outpatient sleep evaluation.   We will keep in hospital today with likelihood for DC tomorrow with medication adjustment depending upon echo results.  Signed, Troy Sine, MD,  [facility] 09/04/2022, 10:56 AM

## 2022-09-04 NOTE — Progress Notes (Signed)
Mobility Specialist Progress Note    09/04/22 0923  Mobility  Activity Ambulated independently in hallway  Level of Assistance Independent  Assistive Device None  Distance Ambulated (ft) 420 ft  Activity Response Tolerated well  Mobility Referral Yes  $Mobility charge 1 Mobility   Pre-Mobility: 67 HR, 96% SpO2 During Mobility: 95 HR Post-Mobility: 81 HR  Pt received sitting EOB and agreeable. C/o some SOB with exertion. Returned to sitting EOB with call bell in reach.    Hildred Alamin Mobility Specialist  Secure Chat Only

## 2022-09-05 DIAGNOSIS — I249 Acute ischemic heart disease, unspecified: Secondary | ICD-10-CM | POA: Diagnosis not present

## 2022-09-05 DIAGNOSIS — I10 Essential (primary) hypertension: Secondary | ICD-10-CM | POA: Diagnosis not present

## 2022-09-05 DIAGNOSIS — E785 Hyperlipidemia, unspecified: Secondary | ICD-10-CM | POA: Diagnosis not present

## 2022-09-05 LAB — LIPOPROTEIN A (LPA): Lipoprotein (a): 14.8 nmol/L (ref <75.0)

## 2022-09-05 MED ORDER — LOSARTAN POTASSIUM 25 MG PO TABS: 25.0000 mg | ORAL_TABLET | Freq: Every day | ORAL | 0 refills | Status: DC

## 2022-09-05 MED ORDER — ATORVASTATIN CALCIUM 80 MG PO TABS: 80.0000 mg | ORAL_TABLET | Freq: Every day | ORAL | 1 refills | Status: DC

## 2022-09-05 MED ORDER — ASPIRIN 81 MG PO CHEW: 81.0000 mg | CHEWABLE_TABLET | Freq: Every day | ORAL | 1 refills | Status: AC

## 2022-09-05 MED ORDER — CLOPIDOGREL BISULFATE 75 MG PO TABS: 75.0000 mg | ORAL_TABLET | Freq: Every day | ORAL | 1 refills | Status: DC

## 2022-09-05 MED ORDER — METOPROLOL TARTRATE 25 MG PO TABS: 25.0000 mg | ORAL_TABLET | Freq: Two times a day (BID) | ORAL | 0 refills | Status: DC

## 2022-09-05 MED ORDER — ISOSORBIDE MONONITRATE ER 30 MG PO TB24: 30.0000 mg | ORAL_TABLET | Freq: Every day | ORAL | 0 refills | Status: DC

## 2022-09-05 MED ORDER — METOPROLOL TARTRATE 25 MG PO TABS: 25.0000 mg | ORAL_TABLET | Freq: Two times a day (BID) | ORAL | Status: DC

## 2022-09-05 MED ORDER — NITROGLYCERIN 0.4 MG SL SUBL: 0.4000 mg | SUBLINGUAL_TABLET | SUBLINGUAL | 2 refills | Status: AC | PRN

## 2022-09-05 NOTE — Progress Notes (Signed)
Pt walked earlier with mobility w/o cp and sob. Pt was received MI book and was educated on restrictions, asa use, ntg use and calling 911, heart healthy diet, ex guidelines, and CRPII. Pt will be referred to CRPII at [facility].

## 2022-09-05 NOTE — Plan of Care (Signed)

## 2022-09-05 NOTE — Progress Notes (Signed)
Progress Note  Patient Name: Jeanette Mckee Date of Encounter: 09/05/2022  Primary Cardiologist: new; Dr. Claiborne Billings  Subjective     Inpatient Medications    Scheduled Meds:  aspirin  81 mg Oral Daily   atorvastatin  80 mg Oral Daily   enoxaparin (LOVENOX) injection  40 mg Subcutaneous Q24H   isosorbide mononitrate  30 mg Oral Daily   metoprolol tartrate  12.5 mg Oral BID   sodium chloride flush  3 mL Intravenous Q12H   Continuous Infusions:  sodium chloride     PRN Meds: sodium chloride, acetaminophen, hydrocortisone cream, nitroGLYCERIN, ondansetron (ZOFRAN) IV, sodium chloride flush   Vital Signs    Vitals:   09/04/22 1938 09/04/22 2318 09/05/22 0411 09/05/22 0823  BP: (!) 148/62 (!) 153/69 (!) 167/79 (!) 153/72  Pulse: 68 65 74 78  Resp: 17 20 19  (!) 21  Temp: 98.1 F (36.7 C) 98.6 F (37 C) 98.8 F (37.1 C) 98.9 F (37.2 C)  TempSrc: Oral Oral Oral Oral  SpO2: 95% 94% 96%     Intake/Output Summary (Last 24 hours) at 09/05/2022 0943 Last data filed at 09/05/2022 0829 Gross per 24 hour  Intake 600 ml  Output --  Net 600 ml    I/O since admission: +1320    Telemetry    Sinus in the 60s- Personally Reviewed  ECG    ECG (independently read by me): NSR at 64, no J point elevation in I, aVL and previous marked T wave abnormality in lead III and aVF; now with mild T wave inversion V5-6  Physical Exam   Patient Vitals for the past 24 hrs:  BP Temp Temp src Pulse Resp SpO2  09/05/22 0823 (!) 153/72 98.9 F (37.2 C) Oral 78 (!) 21 --  09/05/22 0411 (!) 167/79 98.8 F (37.1 C) Oral 74 19 96 %   General: Alert, oriented, no distress.  Skin: normal turgor, no rashes, warm and dry HEENT: Normocephalic, atraumatic. Pupils equal round and reactive to light; sclera anicteric; extraocular muscles intact;  Nose without nasal septal hypertrophy Mouth/Parynx benign; Mallinpatti scale 3 Neck: No JVD, no carotid bruits; normal carotid upstroke Lungs:  clear to ausculatation and percussion; no wheezing or rales Chest wall: without tenderness to palpitation Heart: PMI not displaced, RRR, s1 s2 normal, 1/6 systolic murmur, no diastolic murmur, no rubs, gallops, thrills, or heaves Abdomen: soft, nontender; no hepatosplenomehaly, BS+; abdominal aorta nontender and not dilated by palpation. Back: no CVA tenderness Pulses 2+R radial site stable Musculoskeletal: full range of motion, normal strength, no joint deformities Extremities: no clubbing cyanosis or edema, Homan's sign negative  Neurologic: grossly nonfocal; Cranial nerves grossly wnl Psychologic: Normal mood and affect     Labs    Chemistry Recent Labs  Lab 09/03/22 0600 09/03/22 0946 09/04/22 0027  NA 137  --  135  K 3.7  --  3.6  CL 102  --  101  CO2 27  --  24  GLUCOSE 124*  --  118*  BUN 14  --  15  CREATININE 0.55 0.53 0.67  CALCIUM 9.0  --  8.5*  GFRNONAA >60 >60 >60  ANIONGAP 8  --  10     Hematology Recent Labs  Lab 09/03/22 0600 09/03/22 0946 09/04/22 0027  WBC 8.1 10.0 9.7  RBC 5.10 5.12* 4.25  HGB 15.3* 15.6* 12.6  HCT 45.0 45.8 37.9  MCV 88.2 89.5 89.2  MCH 30.0 30.5 29.6  MCHC 34.0 34.1 33.2  RDW 12.9 12.8 12.9  PLT 319 270 283     HS Troponin: 17 > 269 > 849  Cardiac EnzymesNo results for input(s): "TROPONINI" in the last 168 hours. No results for input(s): "TROPIPOC" in the last 168 hours.   BNPNo results for input(s): "BNP", "PROBNP" in the last 168 hours.   DDimer No results for input(s): "DDIMER" in the last 168 hours.   Lipid Panel     Component Value Date/Time   CHOL 221 (H) 03/16/2022 0846   TRIG 62.0 03/16/2022 0846   HDL 83.10 03/16/2022 0846   CHOLHDL 3 03/16/2022 0846   VLDL 12.4 03/16/2022 0846   LDLCALC 125 (H) 03/16/2022 0846     Radiology    ECHOCARDIOGRAM COMPLETE  Result Date: 09/04/2022    ECHOCARDIOGRAM REPORT   Patient Name:   Jeanette Mckee Date of Exam: 09/04/2022 Medical Rec #:  [id number]        Height:       65.0 in Accession #:    [id number]      Weight:       191.8 lb Date of Birth:  02/23/1951      BSA:          1.943 m Patient Age:    71 years        BP:           176/73 mmHg Patient Gender: F               HR:           66 bpm. Exam Location:  Inpatient Procedure: 2D Echo, Cardiac Doppler and Color Doppler Indications:    Acute ischemic heart disease  History:        Patient has no prior history of Echocardiogram examinations.                 Risk Factors:Former Smoker, Hypertension and Dyslipidemia.  Sonographer:    Clayton Lefort RDCS (AE) Referring Phys: [place] Comments: Suboptimal parasternal window. IMPRESSIONS  1. Left ventricular ejection fraction, by estimation, is 55%. The left ventricle has normal function. The left ventricle has no regional wall motion abnormalities. There is mild concentric left ventricular hypertrophy. Left ventricular diastolic parameters are consistent with Grade II diastolic dysfunction (pseudonormalization).  2. Right ventricular systolic function is normal. The right ventricular size is normal. There is normal pulmonary artery systolic pressure. The estimated right ventricular systolic pressure is Q000111Q mmHg.  3. The mitral valve is normal in structure. Trivial mitral valve regurgitation. No evidence of mitral stenosis.  4. The aortic valve was not well visualized. Aortic valve regurgitation is not visualized. No aortic stenosis is present.  5. The inferior vena cava is normal in size with greater than 50% respiratory variability, suggesting right atrial pressure of 3 mmHg. FINDINGS  Left Ventricle: Left ventricular ejection fraction, by estimation, is 55%. The left ventricle has normal function. The left ventricle has no regional wall motion abnormalities. The left ventricular internal cavity size was normal in size. There is mild concentric left ventricular hypertrophy. Left ventricular diastolic parameters are consistent with Grade II  diastolic dysfunction (pseudonormalization). Right Ventricle: The right ventricular size is normal. No increase in right ventricular wall thickness. Right ventricular systolic function is normal. There is normal pulmonary artery systolic pressure. The tricuspid regurgitant velocity is 2.33 m/s, and  with an assumed right atrial pressure of 3 mmHg, the estimated right ventricular systolic pressure is Q000111Q mmHg. Left Atrium: Left  atrial size was normal in size. Right Atrium: Right atrial size was normal in size. Pericardium: There is no evidence of pericardial effusion. Mitral Valve: The mitral valve is normal in structure. Mild mitral annular calcification. Trivial mitral valve regurgitation. No evidence of mitral valve stenosis. Tricuspid Valve: The tricuspid valve is normal in structure. Tricuspid valve regurgitation is not demonstrated. Aortic Valve: The aortic valve was not well visualized. Aortic valve regurgitation is not visualized. No aortic stenosis is present. Aortic valve mean gradient measures 2.0 mmHg. Aortic valve peak gradient measures 4.3 mmHg. Aortic valve area, by VTI measures 2.70 cm. Pulmonic Valve: The pulmonic valve was normal in structure. Pulmonic valve regurgitation is not visualized. Aorta: The aortic root is normal in size and structure. Venous: The inferior vena cava is normal in size with greater than 50% respiratory variability, suggesting right atrial pressure of 3 mmHg. IAS/Shunts: No atrial level shunt detected by color flow Doppler.  LEFT VENTRICLE PLAX 2D LVIDd:         4.30 cm   Diastology LVIDs:         3.20 cm   LV e' medial:    8.49 cm/s LV PW:         1.20 cm   LV E/e' medial:  13.0 LV IVS:        1.20 cm   LV e' lateral:   9.79 cm/s LVOT diam:     1.90 cm   LV E/e' lateral: 11.2 LV SV:         65 LV SV Index:   34 LVOT Area:     2.84 cm  RIGHT VENTRICLE RV Basal diam:  3.30 cm RV S prime:     12.50 cm/s TAPSE (M-mode): 2.3 cm LEFT ATRIUM             Index        RIGHT ATRIUM            Index LA diam:        3.50 cm 1.80 cm/m   RA Area:     12.10 cm LA Vol (A2C):   43.8 ml 22.54 ml/m  RA Volume:   25.10 ml  12.92 ml/m LA Vol (A4C):   30.4 ml 15.64 ml/m LA Biplane Vol: 39.7 ml 20.43 ml/m  AORTIC VALVE AV Area (Vmax):    2.57 cm AV Area (Vmean):   2.61 cm AV Area (VTI):     2.70 cm AV Vmax:           104.00 cm/s AV Vmean:          72.900 cm/s AV VTI:            0.243 m AV Peak Grad:      4.3 mmHg AV Mean Grad:      2.0 mmHg LVOT Vmax:         94.30 cm/s LVOT Vmean:        67.100 cm/s LVOT VTI:          0.231 m LVOT/AV VTI ratio: 0.95  AORTA Ao Root diam: 2.70 cm Ao Asc diam:  3.10 cm MITRAL VALVE                TRICUSPID VALVE MV Area (PHT): 2.84 cm     TR Peak grad:   21.7 mmHg MV Decel Time: 267 msec     TR Vmax:        233.00 cm/s MV E velocity: 110.00 cm/s MV A velocity: 73.10  cm/s   SHUNTS MV E/A ratio:  1.50         Systemic VTI:  0.23 m                             Systemic Diam: 1.90 cm Dalton McleanMD Electronically signed by Franki Monte Signature Date/Time: 09/04/2022/1:50:21 PM    Final     Cardiac Studies   CARDIAC CATH: 09/02/2022   Prox RCA to Mid RCA lesion is 85% stenosed.   Prox RCA lesion is 40% stenosed.   Prox LAD to Mid LAD lesion is 55% stenosed.   Mid LAD lesion is 40% stenosed.   Dist LAD lesion is 40% stenosed.   Mid Cx lesion is 30% stenosed.   Dist Cx lesion is 40% stenosed.   2nd Diag-1 lesion is 30% stenosed.   2nd Diag-2 lesion is 20% stenosed.   Ost LAD to Prox LAD lesion is 20% stenosed.   The left ventricular ejection fraction is 45-50% by visual estimate.   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   Very large dominant left circumflex coronary artery with mild nonobstructive plaque in the AV groove circumflex.  The third marginal branch is diminutive.  The distal circumflex supplies four vessel supplying the posterior and inferolateral wall.  There is a suggestion of very  distal diminutive vessel collateralization collateralization at the apex.   Very small nondominant RCA with diffuse 40% proximal stenosis and 85% mid stenosis.  Vessel caliber appears taller than the 5 French (1.7 mm) diagnostic catheter.   Mild LV dysfunction with suggestion of infero-apical hypocontractility.  LVEDP 28 mmHg.   RECOMMENDATION: Initial attempt at medical therapy trial.  Plan 2D echo Doppler study today for further assessment of LV function.  Patient has been on amlodipine 5 mg and lisinopril HCT for blood pressure control; will add low-dose metoprolol tartrate and isosorbide. Aggressive lipid management with target LDL less than 70.       ECHO: 09/03/2022: 1. Left ventricular ejection fraction, by estimation, is 55%. The left  ventricle has normal function. The left ventricle has no regional wall  motion abnormalities. There is mild concentric left ventricular  hypertrophy. Left ventricular diastolic  parameters are consistent with Grade II diastolic dysfunction  (pseudonormalization).   2. Right ventricular systolic function is normal. The right ventricular  size is normal. There is normal pulmonary artery systolic pressure. The  estimated right ventricular systolic pressure is Q000111Q mmHg.   3. The mitral valve is normal in structure. Trivial mitral valve  regurgitation. No evidence of mitral stenosis.   4. The aortic valve was not well visualized. Aortic valve regurgitation  is not visualized. No aortic stenosis is present.   5. The inferior vena cava is normal in size with greater than 50%  respiratory variability, suggesting right atrial pressure of 3 mmHg.    Patient Profile     71 y.o. female who has a history of hypertension and has been on amlodipine 5 mg and lisinopril HCT prior to admission.  She has noticed some slight increase in shortness of breath over the past several weeks.  On October 21 she was awakened from sleep at 4 AM with development of chest  pressure which persisted and became more intense leading to ER evaluation at [facility].  Initial ECG showed 1 mm J-point elevation in leads I and aVL with T wave inversion in  leads III and aVF.  She was transferred and underwent emergent catheterization possible STEMI.  Assessment & Plan    NSTEMI: Patient developed new onset chest pain while sleeping early Sunday morning.  Initial ECG showed 1 mm J-point elevation in lead I and aVL with T wave inversion in 3 and aVF.  Acute catheterization demonstrated dominant circumflex system, there was mild to moderate CAD in the LAD left circumflex which was a large dominant vessel and a nondominant system diminutive sized RVA had 85% mid stenosis.  She was pain-free on arrival to the hospital and remained pain-free throughout the procedure.  An initial medical therapy trial was recommended.  She was started on low-dose isosorbide and metoprolol tartrate yesterday.  He had been on amlodipine 5 mg and lisinopril HCTZ preadmission.  Amlodipine has been resumed.  We will change lisinopril HCT to ARB therapy.  Echo Doppler shows normal LV function without wall motion abnormalities and grade 2 diastolic dysfunction most likely contributed by her hypertension. Hypertension: Patient has been on amlodipine 5 mg and lisinopril-HCTZ/25 mg prior to admission.  Currently no edema, will change lisinopril HCT to losartan initially at 25 mg with probable titration to 50 mg when seen in office as outpatient.  We will increase metoprolol tartrate to 25 mg twice a day today with resting heart rate in the 80s with mild blood pressure elevation. Hyperlipidemia: LDL in May 2023 was elevated at 125.  She has not been on any therapy.  With multivessel CAD as noted above, atorvastatin 80 mg was initiated following her catheterization yesterday.  Aim for target LDL less than 70 and preferably less than 55. Evaluate for obstructive sleep apnea: The patient was awakened from sleep at  4 AM and symptoms continued until her ER evaluation.  I discussed with her concern for possible nocturnal hypoxemia contributing to her symptomatology but most likely she was in deep REM sleep which is usually associated with more severe sleep apnea c/w NREM sleep.  Discussed  outpatient sleep evaluation.   Patient feels great.  Plan discharge today with [place] office visit in 1 to 2 weeks and subsequent evaluation with me in approximately 6 weeks.  Signed, Troy Sine, MD, [facility] 09/05/2022, 9:43 AM

## 2022-09-05 NOTE — Progress Notes (Signed)
1 PIV removed without complication, site clean dry and intact. Discharge instructions given to patient. Patient states she understands when to start new medications and follow up appointment.

## 2022-09-05 NOTE — Discharge Summary (Signed)
Discharge Summary    Patient ID: Jeanette Mckee MRN: [id number]; DOB: [birth year]  Admit date: 09/03/2022 Discharge date: 09/05/2022  PCP:  Darreld Mclean, MD   [place] Providers Cardiologist:  [name], MD     Discharge Diagnoses    Principal Problem:   ACS (acute coronary syndrome) [facility]) Active Problems:   HTN (hypertension)   Hyperlipidemia  Diagnostic Studies/Procedures    CARDIAC CATH: 09/02/2022   Prox RCA to Mid RCA lesion is 85% stenosed.   Prox RCA lesion is 40% stenosed.   Prox LAD to Mid LAD lesion is 55% stenosed.   Mid LAD lesion is 40% stenosed.   Dist LAD lesion is 40% stenosed.   Mid Cx lesion is 30% stenosed.   Dist Cx lesion is 40% stenosed.   2nd Diag-1 lesion is 30% stenosed.   2nd Diag-2 lesion is 20% stenosed.   Ost LAD to Prox LAD lesion is 20% stenosed.   The left ventricular ejection fraction is 45-50% by visual estimate.   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   Very large dominant left circumflex coronary artery with mild nonobstructive plaque in the AV groove circumflex.  The third marginal branch is diminutive.  The distal circumflex supplies four vessel supplying the posterior and inferolateral wall.  There is a suggestion of very distal diminutive vessel collateralization collateralization at the apex.   Very small nondominant RCA with diffuse 40% proximal stenosis and 85% mid stenosis.  Vessel caliber appears taller than the 5 French (1.7 mm) diagnostic catheter.   Mild LV dysfunction with suggestion of infero-apical hypocontractility.  LVEDP 28 mmHg.   RECOMMENDATION: Initial attempt at medical therapy trial.  Plan 2D echo Doppler study today for further assessment of LV function.  Patient has been on amlodipine 5 mg and lisinopril HCT for blood pressure control; will add low-dose metoprolol tartrate and isosorbide. Aggressive lipid management with target LDL less  than 70.            ECHO: 09/03/2022: 1. Left ventricular ejection fraction, by estimation, is 55%. The left  ventricle has normal function. The left ventricle has no regional wall  motion abnormalities. There is mild concentric left ventricular  hypertrophy. Left ventricular diastolic  parameters are consistent with Grade II diastolic dysfunction  (pseudonormalization).   2. Right ventricular systolic function is normal. The right ventricular  size is normal. There is normal pulmonary artery systolic pressure. The  estimated right ventricular systolic pressure is 33.2 mmHg.   3. The mitral valve is normal in structure. Trivial mitral valve  regurgitation. No evidence of mitral stenosis.   4. The aortic valve was not well visualized. Aortic valve regurgitation  is not visualized. No aortic stenosis is present.   5. The inferior vena cava is normal in size with greater than 50%  respiratory variability, suggesting right atrial pressure of 3 mmHg.      _____________   History of Present Illness     Jeanette Mckee is a 71 y.o. female with no significant PMH who presented with chest pain.   Jeanette Mckee stated sudden onset midsternal chest tightness radiating to her left arm and jaw while she was sleeping around 4 AM today.  She also reported associated symptoms of diaphoresis, nausea and vomiting.  Patient presented at the local ER and was found to have a STEMI and transferred here for further evaluation.  Patient was seen in the ED, still  complain chest pain, hemodynamic stable. Denies fever, chills, dizziness, syncope, heart palpitations, dyspnea, nausea, vomiting, dysuria, diarrhea, pedal edema or any bleeding events.   Denies history of smoking or drinking.  Reports family history of immature CAD.  She was admitted to cardiology and taken for emergent cardiac cath.   Hospital Course     NSTEMI -- High-sensitivity troponin peaked at 849.  Underwent cardiac catheterization noted  above with multivessel CAD.  Very large dominant left circumflex vessel with mild nonobstructive plaque in AV groove.  Very small nondominant RCA with diffuse 40% proximal stenosis as well as 85% mid vessel stenosis.  Given small caliber vessel recommendations for medical management.  Echocardiogram showed LVEF of 37%, grade 2 diastolic dysfunction, no wall motion abnormality, normal RV size and function. -- Continue aspirin, Plavix (started at discharge after discussion with MD), statin and beta-blocker  HTN -- Mildly elevated -- Resumed on amlodipine 5 mg daily, started on metoprolol 25 mg twice daily as well as losartan 25 mg daily  HLD -- Started on atorvastatin 80 mg daily -- Will need LFT/FLP as an outpatient  Possible OSA -- Concern for nocturnal hypoxemia -- Will need outpatient sleep study evaluation   Patient was seen by Dr. Claiborne Billings and deemed stable for discharge home. Follow up arranged in the office. Medications sent to pharmacy of choice. Educated by pharmD prior to DC.   Did the patient have an acute coronary syndrome (MI, NSTEMI, STEMI, etc) this admission?:  Yes                               AHA/ACC Clinical Performance & Quality Measures: Aspirin prescribed? - Yes ADP Receptor Inhibitor (Plavix/Clopidogrel, Brilinta/Ticagrelor or Effient/Prasugrel) prescribed (includes medically managed patients)? - Yes Beta Blocker prescribed? - Yes High Intensity Statin (Lipitor 40-46m or Crestor 20-41m prescribed? - Yes EF assessed during THIS hospitalization? - Yes For EF <40%, was ACEI/ARB prescribed? - Yes For EF <40%, Aldosterone Antagonist (Spironolactone or Eplerenone) prescribed? - Not Applicable (EF >/= 4016%Cardiac Rehab Phase II ordered (including medically managed patients)? - Yes    The patient will be scheduled for a TOC follow up appointment in 10-14 days.  A message [facility] at the office where the patient should be seen for  follow up.  _____________  Discharge Vitals Blood pressure (!) 157/82, pulse 78, temperature 98.8 F (37.1 C), temperature source Oral, resp. rate (!) 24, SpO2 96 %.  There were no vitals filed for this visit.  Labs & Radiologic Studies    CBC Recent Labs    09/03/22 0600 09/03/22 0946 09/04/22 0027  WBC 8.1 10.0 9.7  NEUTROABS 3.8  --   --   HGB 15.3* 15.6* 12.6  HCT 45.0 45.8 37.9  MCV 88.2 89.5 89.2  PLT 319 270 [id number] Basic Metabolic Panel Recent Labs    09/03/22 0600 09/03/22 0946 09/04/22 0027  NA 137  --  135  K 3.7  --  3.6  CL 102  --  101  CO2 27  --  24  GLUCOSE 124*  --  118*  BUN 14  --  15  CREATININE 0.55 0.53 0.67  CALCIUM 9.0  --  8.5*   Liver Function Tests No results for input(s): "AST", "ALT", "ALKPHOS", "BILITOT", "PROT", "ALBUMIN" in the last 72 hours. No results for input(s): "LIPASE", "AMYLASE" in the last 72 hours. High  Sensitivity Troponin:   Recent Labs  Lab 09/03/22 0600 09/03/22 0946 09/03/22 1150  TROPONINIHS 17 269* 849*    BNP Invalid input(s): "POCBNP" D-Dimer No results for input(s): "DDIMER" in the last 72 hours. Hemoglobin A1C No results for input(s): "HGBA1C" in the last 72 hours. Fasting Lipid Panel No results for input(s): "CHOL", "HDL", "LDLCALC", "TRIG", "CHOLHDL", "LDLDIRECT" in the last 72 hours. Thyroid Function Tests No results for input(s): "TSH", "T4TOTAL", "T3FREE", "THYROIDAB" in the last 72 hours.  Invalid input(s): "FREET3" _____________  ECHOCARDIOGRAM COMPLETE  Result Date: 09/04/2022    ECHOCARDIOGRAM REPORT   Patient Name:   Jeanette Mckee Date of Exam: 09/04/2022 Medical Rec #:  [id number]       Height:       65.0 in Accession #:    [id number]      Weight:       191.8 lb Date of Birth:  16-Nov-1950      BSA:          1.943 m Patient Age:    64 years        BP:           176/73 mmHg Patient Gender: F               HR:           66 bpm. Exam Location:  Inpatient Procedure: 2D Echo, Cardiac Doppler  and Color Doppler Indications:    Acute ischemic heart disease  History:        Patient has no prior history of Echocardiogram examinations.                 Risk Factors:Former Smoker, Hypertension and Dyslipidemia.  Sonographer:    Clayton Lefort RDCS (AE) Referring Phys: [place] Comments: Suboptimal parasternal window. IMPRESSIONS  1. Left ventricular ejection fraction, by estimation, is 55%. The left ventricle has normal function. The left ventricle has no regional wall motion abnormalities. There is mild concentric left ventricular hypertrophy. Left ventricular diastolic parameters are consistent with Grade II diastolic dysfunction (pseudonormalization).  2. Right ventricular systolic function is normal. The right ventricular size is normal. There is normal pulmonary artery systolic pressure. The estimated right ventricular systolic pressure is 73.2 mmHg.  3. The mitral valve is normal in structure. Trivial mitral valve regurgitation. No evidence of mitral stenosis.  4. The aortic valve was not well visualized. Aortic valve regurgitation is not visualized. No aortic stenosis is present.  5. The inferior vena cava is normal in size with greater than 50% respiratory variability, suggesting right atrial pressure of 3 mmHg. FINDINGS  Left Ventricle: Left ventricular ejection fraction, by estimation, is 55%. The left ventricle has normal function. The left ventricle has no regional wall motion abnormalities. The left ventricular internal cavity size was normal in size. There is mild concentric left ventricular hypertrophy. Left ventricular diastolic parameters are consistent with Grade II diastolic dysfunction (pseudonormalization). Right Ventricle: The right ventricular size is normal. No increase in right ventricular wall thickness. Right ventricular systolic function is normal. There is normal pulmonary artery systolic pressure. The tricuspid regurgitant velocity is 2.33 m/s, and  with an  assumed right atrial pressure of 3 mmHg, the estimated right ventricular systolic pressure is 20.2 mmHg. Left Atrium: Left atrial size was normal in size. Right Atrium: Right atrial size was normal in size. Pericardium: There is no evidence of pericardial effusion. Mitral Valve: The mitral valve is normal in structure. Mild mitral annular calcification.  Trivial mitral valve regurgitation. No evidence of mitral valve stenosis. Tricuspid Valve: The tricuspid valve is normal in structure. Tricuspid valve regurgitation is not demonstrated. Aortic Valve: The aortic valve was not well visualized. Aortic valve regurgitation is not visualized. No aortic stenosis is present. Aortic valve mean gradient measures 2.0 mmHg. Aortic valve peak gradient measures 4.3 mmHg. Aortic valve area, by VTI measures 2.70 cm. Pulmonic Valve: The pulmonic valve was normal in structure. Pulmonic valve regurgitation is not visualized. Aorta: The aortic root is normal in size and structure. Venous: The inferior vena cava is normal in size with greater than 50% respiratory variability, suggesting right atrial pressure of 3 mmHg. IAS/Shunts: No atrial level shunt detected by color flow Doppler.  LEFT VENTRICLE PLAX 2D LVIDd:         4.30 cm   Diastology LVIDs:         3.20 cm   LV e' medial:    8.49 cm/s LV PW:         1.20 cm   LV E/e' medial:  13.0 LV IVS:        1.20 cm   LV e' lateral:   9.79 cm/s LVOT diam:     1.90 cm   LV E/e' lateral: 11.2 LV SV:         65 LV SV Index:   34 LVOT Area:     2.84 cm  RIGHT VENTRICLE RV Basal diam:  3.30 cm RV S prime:     12.50 cm/s TAPSE (M-mode): 2.3 cm LEFT ATRIUM             Index        RIGHT ATRIUM           Index LA diam:        3.50 cm 1.80 cm/m   RA Area:     12.10 cm LA Vol (A2C):   43.8 ml 22.54 ml/m  RA Volume:   25.10 ml  12.92 ml/m LA Vol (A4C):   30.4 ml 15.64 ml/m LA Biplane Vol: 39.7 ml 20.43 ml/m  AORTIC VALVE AV Area (Vmax):    2.57 cm AV Area (Vmean):   2.61 cm AV Area (VTI):      2.70 cm AV Vmax:           104.00 cm/s AV Vmean:          72.900 cm/s AV VTI:            0.243 m AV Peak Grad:      4.3 mmHg AV Mean Grad:      2.0 mmHg LVOT Vmax:         94.30 cm/s LVOT Vmean:        67.100 cm/s LVOT VTI:          0.231 m LVOT/AV VTI ratio: 0.95  AORTA Ao Root diam: 2.70 cm Ao Asc diam:  3.10 cm MITRAL VALVE                TRICUSPID VALVE MV Area (PHT): 2.84 cm     TR Peak grad:   21.7 mmHg MV Decel Time: 267 msec     TR Vmax:        233.00 cm/s MV E velocity: 110.00 cm/s MV A velocity: 73.10 cm/s   SHUNTS MV E/A ratio:  1.50         Systemic VTI:  0.23 m  Systemic Diam: 1.90 cm Dalton McleanMD Electronically signed by Franki Monte Signature Date/Time: 09/04/2022/1:50:21 PM    Final    CARDIAC CATHETERIZATION  Result Date: 09/03/2022   Prox RCA to Mid RCA lesion is 85% stenosed.   Prox RCA lesion is 40% stenosed.   Prox LAD to Mid LAD lesion is 55% stenosed.   Mid LAD lesion is 40% stenosed.   Dist LAD lesion is 40% stenosed.   Mid Cx lesion is 30% stenosed.   Dist Cx lesion is 40% stenosed.   2nd Diag-1 lesion is 30% stenosed.   2nd Diag-2 lesion is 20% stenosed.   Ost LAD to Prox LAD lesion is 20% stenosed.   The left ventricular ejection fraction is 45-50% by visual estimate. 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 Very large dominant left circumflex coronary artery with mild nonobstructive plaque in the AV groove circumflex.  The third marginal branch is diminutive.  The distal circumflex supplies four vessel supplying the posterior and inferolateral wall.  There is a suggestion of very distal diminutive vessel collateralization collateralization at the apex. Very small nondominant RCA with diffuse 40% proximal stenosis and 85% mid stenosis.  Vessel caliber appears taller than the 5 French (1.7 mm) diagnostic catheter. Mild LV dysfunction with suggestion of infero-apical hypocontractility.  LVEDP 28 mmHg.  RECOMMENDATION: Initial attempt at medical therapy trial.  Plan 2D echo Doppler study today for further assessment of LV function.  Patient has been on amlodipine 5 mg and lisinopril HCT for blood pressure control; will add low-dose metoprolol tartrate and isosorbide.  Aggressive lipid management with target LDL less than 70.   DG Chest Portable 1 View  Result Date: 09/03/2022 CLINICAL DATA:  Chest pain for 2 hours EXAM: PORTABLE CHEST 1 VIEW COMPARISON:  None Available. FINDINGS: Artifact from EKG leads and defibrillator pads. Normal heart size and mediastinal contours. No acute infiltrate or edema. No effusion or pneumothorax. No acute osseous findings. IMPRESSION: Negative portable chest. Electronically Signed   By: Jorje Guild M.D.   On: 09/03/2022 06:22   Disposition   Pt is being discharged home today in good condition.  Follow-up Plans & Appointments     Follow-up Information     Lenna Sciara, NP Follow up on 09/12/2022.   Specialties: Nurse Practitioner, Family Medicine Why: at 2:20pm for your follow up appt with Dr. Ermalinda Memos' NP Maris Berger information: [address] [place] [address] [place] [address] [phone]                Discharge Instructions     Amb Referral to Cardiac Rehabilitation   Complete by: As directed    Diagnosis: STEMI   After initial evaluation and assessments completed: Virtual Based Care may be provided alone or in conjunction with Phase 2 Cardiac Rehab based on patient barriers.: Yes   Intensive Cardiac Rehabilitation ([place]) [place] location only OR Traditional Cardiac Rehabilitation (TCR) *If criteria for ICR are not met will enroll in TCR [facility] only): Yes   Call MD for:  difficulty breathing, headache or visual disturbances   Complete by: As directed    Call MD for:  persistant dizziness or light-headedness   Complete by: As directed    Call MD for:  redness, tenderness, or signs of infection (pain, swelling, redness, odor or green/yellow  discharge around incision site)   Complete by: As directed    Diet - low sodium heart healthy   Complete by: As directed  Discharge instructions   Complete by: As directed    Radial Site Care Refer to this sheet in the next few weeks. These instructions provide you with information on caring for yourself after your procedure. Your caregiver may also give you more specific instructions. Your treatment has been planned according to current medical practices, but problems sometimes occur. Call your caregiver if you have any problems or questions after your procedure. HOME CARE INSTRUCTIONS You may shower the day after the procedure. Remove the bandage (dressing) and gently wash the site with plain soap and water. Gently pat the site dry.  Do not apply powder or lotion to the site.  Do not submerge the affected site in water for 3 to 5 days.  Inspect the site at least twice daily.  Do not flex or bend the affected arm for 24 hours.  No lifting over 5 pounds (2.3 kg) for 5 days after your procedure.  Do not drive home if you are discharged the same day of the procedure. Have someone else drive you.  You may drive 24 hours after the procedure unless otherwise instructed by your caregiver.  What to expect: Any bruising will usually fade within 1 to 2 weeks.  Blood that collects in the tissue (hematoma) may be painful to the touch. It should usually decrease in size and tenderness within 1 to 2 weeks.  SEEK IMMEDIATE MEDICAL CARE IF: You have unusual pain at the radial site.  You have redness, warmth, swelling, or pain at the radial site.  You have drainage (other than a small amount of blood on the dressing).  You have chills.  You have a fever or persistent symptoms for more than 72 hours.  You have a fever and your symptoms suddenly get worse.  Your arm becomes pale, cool, tingly, or numb.  You have heavy bleeding from the site. Hold pressure on the site.   Increase activity slowly    Complete by: As directed    No wound care   Complete by: As directed         Discharge Medications   Allergies as of 09/05/2022   No Known Allergies      Medication List     STOP taking these medications    estradiol 0.1 MG/GM vaginal cream Commonly known as: ESTRACE   lisinopril-hydrochlorothiazide 20-12.5 MG tablet Commonly known as: ZESTORETIC       TAKE these medications    acetaminophen 500 MG tablet Commonly known as: TYLENOL Take 500 mg by mouth daily as needed for mild pain or headache.   amLODipine 5 MG tablet Commonly known as: NORVASC Take 1 tablet (5 mg total) by mouth daily.   aspirin 81 MG chewable tablet Chew 1 tablet (81 mg total) by mouth daily. Start taking on: September 06, 2022   atorvastatin 80 MG tablet Commonly known as: LIPITOR Take 1 tablet (80 mg total) by mouth daily. Start taking on: September 06, 2022   clopidogrel 75 MG tablet Commonly known as: Plavix Take 1 tablet (75 mg total) by mouth daily.   fluticasone 50 MCG/ACT nasal spray Commonly known as: FLONASE Place 2 sprays into both nostrils daily. What changed:  when to take this reasons to take this   ibuprofen 200 MG tablet Commonly known as: ADVIL Take 200 mg by mouth daily as needed for mild pain or headache.   isosorbide mononitrate 30 MG 24 hr tablet Commonly known as: IMDUR Take 1 tablet (30 mg total) by mouth daily.  Start taking on: September 06, 2022   losartan 25 MG tablet Commonly known as: Cozaar Take 1 tablet (25 mg total) by mouth daily.   metoprolol tartrate 25 MG tablet Commonly known as: LOPRESSOR Take 1 tablet (25 mg total) by mouth 2 (two) times daily.   multivitamin tablet Take 1 tablet by mouth daily.   nitroGLYCERIN 0.4 MG SL tablet Commonly known as: NITROSTAT Place 1 tablet (0.4 mg total) under the tongue every 5 (five) minutes as needed for chest pain.        Outstanding Labs/Studies   FLP/LFTs in 8 weeks BMP at  follow-up  Duration of Discharge Encounter   Greater than 30 minutes including physician time.  Signed, Reino Bellis, NP 09/05/2022, 12:54 PM

## 2022-09-05 NOTE — Progress Notes (Signed)
Mobility Specialist Progress Note    09/05/22 0938  Mobility  Activity Ambulated independently in hallway  Level of Assistance Independent  Assistive Device None  Distance Ambulated (ft) 420 ft  Activity Response Tolerated well  Mobility Referral Yes  $Mobility charge 1 Mobility   Pre-Mobility: 79 HR During Mobility: 107 HR Post-Mobility: 89 HR  Pt received in chair and agreeable. No complaints on walk. Stated she felt like herself today. Returned to chair with call bell in reach.    Jeanette Mckee Mobility Specialist  Secure Chat Only

## 2022-09-06 ENCOUNTER — Telehealth: Payer: Self-pay

## 2022-09-06 NOTE — Telephone Encounter (Signed)
Pt would like PCP to cancel RX for lisinopril with the mail order pharmacy as she is no longer taking it.

## 2022-09-06 NOTE — Telephone Encounter (Signed)
Transition Care Management Follow-up Telephone Call Date of discharge and from where: [place] 09-05-22 Dx: N-STEMI How have you been since you were released from the hospital? Feeling better  Any questions or concerns? No  Items Reviewed: Did the pt receive and understand the discharge instructions provided? Yes  Medications obtained and verified? Yes  Other? No  Any new allergies since your discharge? No  Dietary orders reviewed? Yes Do you have support at home? Yes   Home Care and Equipment/Supplies: Were home health services ordered? no If so, what is the name of the agency? na  Has the agency set up a time to come to the patient's home? not applicable Were any new equipment or medical supplies ordered?  No What is the name of the medical supply agency? na Were you able to get the supplies/equipment? not applicable Do you have any questions related to the use of the equipment or supplies? No  Functional Questionnaire: (I = Independent and D = Dependent) ADLs: I  Bathing/Dressing- I  Meal Prep- I  Eating- I  Maintaining continence- I  Transferring/Ambulation- I  Managing Meds- I  Follow up appointments reviewed:  PCP Hospital f/u appt confirmed? Yes  [facility] Yes  Scheduled to see Dr Sharene Skeans on 09-12-22 @ 220pm. Are transportation arrangements needed? No  If their condition worsens, is the pt aware to call PCP or go to the Emergency Dept.? Yes Was the patient provided with contact information for the PCP's office or ED? Yes Was to pt encouraged to call back with questions or concerns? Yes   Juanda Crumble LPN [place] Direct Dial [phone]

## 2022-09-06 NOTE — Telephone Encounter (Signed)
This was sent to me instead of Dr. Janett Billow Mell Mellott.  I will send to her.

## 2022-09-07 NOTE — Telephone Encounter (Signed)
Called and canceled rx

## 2022-09-10 NOTE — Progress Notes (Unsigned)
[place] at [facility] [address], [place], [place] [address] [phone]  Date:  09/14/2022   Name:  Jeanette Mckee   DOB:  Aug 05, 1951   MRN:  [id number]  PCP:  Darreld Mclean, MD    Chief Complaint: No chief complaint on file.   History of Present Illness:  Jeanette Mckee is a 71 y.o. very pleasant female patient who presents with the following:  Patient seen today for follow-up from recent heart attack I saw her in the office in May-previously history of hypertension, osteopenia, chronic back pain which improved after spinal fusion in September 2022 per Dr. Saintclair Halsted  She has 2 children, 8 grandchildren, 3 great grands  She was admitted from 10/22 to 10/24-she was awoken by typical chest pain, went to the ER where she was found to have a STEMI.  Multivessel disease, recommended medical therapy for the time being NSTEMI -- High-sensitivity troponin peaked at 849.  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  Very large dominant left circumflex vessel with mild nonobstructive plaque in AV groove.  Very small nondominant RCA with diffuse 40% proximal stenosis as well as 85% mid vessel stenosis.  Given small caliber vessel recommendations for medical management.  Echocardiogram showed LVEF of [id number], grade 2 diastolic dysfunction, no wall motion abnormality, normal RV size and function. -- Continue aspirin, Plavix (started at discharge after discussion with MD), statin and beta-blocker HTN -- Mildly elevated -- Resumed on amlodipine 5 mg daily, started on metoprolol 25 mg twice daily as well as losartan 25 mg daily HLD -- Started on atorvastatin 80 mg daily -- Will need LFT/FLP as an outpatient Possible OSA -- Concern for nocturnal hypoxemia -- Will need outpatient sleep study evaluation   Recommend COVID-19 booster this fall Flu shot Shingrix Patient Active Problem List   Diagnosis Date Noted   ACS  (acute coronary syndrome) ([place])    Spinal stenosis at L4-L5 level 08/10/2021   Osteopenia 10/24/2019   Cold sore 04/19/2014   Postmenopausal estrogen deficiency 04/19/2014   Hyperlipidemia 02/11/2013   HTN (hypertension) 02/04/2012   Abnormal liver enzymes 02/04/2012   Chronic back pain 02/04/2012    Past Medical History:  Diagnosis Date   Headache    History of chicken pox    childhood   Hypertension    Measles as a child   Other and unspecified hyperlipidemia 02/11/2013   Preventative health care 02/11/2013    Past Surgical History:  Procedure Laterality Date   ABDOMINAL HYSTERECTOMY  12/1994   and RSO & LS   LEFT HEART CATH AND CORONARY ANGIOGRAPHY N/A 09/03/2022   Procedure: LEFT HEART CATH AND CORONARY ANGIOGRAPHY;  Surgeon: Troy Sine, MD;  Location: [facility];  Service: Cardiovascular;  Laterality: N/A;    Social History   Tobacco Use   Smoking status: Former    Years: 20.00    Types: Cigarettes    Quit date: 11/13/2001    Years since quitting: 20.8   Smokeless tobacco: Never   Tobacco comments:    quit in 2003 1 ppd for 20 years  Substance Use Topics   Alcohol use: Yes    Comment: WINE FRIDAY AND SATURDAY   Drug use: No    Family History  Problem Relation Age of Onset   Alcohol abuse Father    Heart disease Father    Stroke Father    Hypertension Father    Diabetes  Father        type 2   Cancer Father        prostate   Heart disease Mother    Hypertension Mother    Arthritis Mother    Heart attack Mother 72   Crohn's disease Mother    Cancer Maternal Grandmother        bone- nonsmoker   Heart disease Son        MI s/p 3 stents, January 2015   Breast cancer Neg Hx    Colon cancer Neg Hx     No Known Allergies  Medication list has been reviewed and updated.  Current Outpatient Medications on File Prior to Visit  Medication Sig Dispense Refill   acetaminophen (TYLENOL) 500 MG tablet Take 500 mg by mouth daily as needed for  mild pain or headache.     amLODipine (NORVASC) 5 MG tablet Take 1 tablet (5 mg total) by mouth daily. 90 tablet 3   aspirin 81 MG chewable tablet Chew 1 tablet (81 mg total) by mouth daily. 90 tablet 1   atorvastatin (LIPITOR) 80 MG tablet Take 1 tablet (80 mg total) by mouth daily. 90 tablet 1   clopidogrel (PLAVIX) 75 MG tablet Take 1 tablet (75 mg total) by mouth daily. 90 tablet 1   fluticasone (FLONASE) 50 MCG/ACT nasal spray Place 2 sprays into both nostrils daily. (Patient taking differently: Place 2 sprays into both nostrils daily as needed for allergies.) 16 g 6   ibuprofen (ADVIL) 200 MG tablet Take 200 mg by mouth daily as needed for mild pain or headache.     isosorbide mononitrate (IMDUR) 30 MG 24 hr tablet Take 1 tablet (30 mg total) by mouth daily. 90 tablet 0   losartan (COZAAR) 25 MG tablet Take 1 tablet (25 mg total) by mouth daily. 90 tablet 0   metoprolol tartrate (LOPRESSOR) 25 MG tablet Take 1 tablet (25 mg total) by mouth 2 (two) times daily. 180 tablet 0   Multiple Vitamin (MULTIVITAMIN) tablet Take 1 tablet by mouth daily.     nitroGLYCERIN (NITROSTAT) 0.4 MG SL tablet Place 1 tablet (0.4 mg total) under the tongue every 5 (five) minutes as needed for chest pain. 25 tablet 2   No current facility-administered medications on file prior to visit.    Review of Systems:  As per HPI- otherwise negative.   Physical Examination: There were no vitals filed for this visit. There were no vitals filed for this visit. There is no height or weight on file to calculate BMI. Ideal Body Weight:    GEN: no acute distress. HEENT: Atraumatic, Normocephalic.  Ears and Nose: No external deformity. CV: RRR, No M/G/R. No JVD. No thrill. No extra heart sounds. PULM: CTA B, no wheezes, crackles, rhonchi. No retractions. No resp. distress. No accessory muscle use. ABD: S, NT, ND, +BS. No rebound. No HSM. EXTR: No c/c/e PSYCH: Normally interactive. Conversant.    Assessment and  Plan: ***  Signed Lamar Blinks, MD

## 2022-09-12 ENCOUNTER — Ambulatory Visit: Payer: Medicare Other | Attending: Nurse Practitioner | Admitting: Nurse Practitioner

## 2022-09-12 ENCOUNTER — Encounter: Payer: Self-pay | Admitting: Nurse Practitioner

## 2022-09-12 ENCOUNTER — Other Ambulatory Visit: Payer: Self-pay

## 2022-09-12 VITALS — BP 130/80 | HR 66 | Ht 66.0 in | Wt 194.4 lb

## 2022-09-12 DIAGNOSIS — E785 Hyperlipidemia, unspecified: Secondary | ICD-10-CM

## 2022-09-12 DIAGNOSIS — G4734 Idiopathic sleep related nonobstructive alveolar hypoventilation: Secondary | ICD-10-CM | POA: Diagnosis not present

## 2022-09-12 DIAGNOSIS — I25118 Atherosclerotic heart disease of native coronary artery with other forms of angina pectoris: Secondary | ICD-10-CM

## 2022-09-12 DIAGNOSIS — I10 Essential (primary) hypertension: Secondary | ICD-10-CM

## 2022-09-12 DIAGNOSIS — I252 Old myocardial infarction: Secondary | ICD-10-CM

## 2022-09-12 MED ORDER — ISOSORBIDE MONONITRATE ER 30 MG PO TB24: 30.0000 mg | ORAL_TABLET | Freq: Every day | ORAL | 3 refills | Status: DC

## 2022-09-12 MED ORDER — METOPROLOL TARTRATE 25 MG PO TABS: 25.0000 mg | ORAL_TABLET | Freq: Two times a day (BID) | ORAL | 3 refills | Status: DC

## 2022-09-12 MED ORDER — LOSARTAN POTASSIUM 25 MG PO TABS: 25.0000 mg | ORAL_TABLET | Freq: Every day | ORAL | 3 refills | Status: DC

## 2022-09-12 MED ORDER — ATORVASTATIN CALCIUM 80 MG PO TABS: 80.0000 mg | ORAL_TABLET | Freq: Every day | ORAL | 1 refills | Status: DC

## 2022-09-12 MED ORDER — CLOPIDOGREL BISULFATE 75 MG PO TABS: 75.0000 mg | ORAL_TABLET | Freq: Every day | ORAL | 3 refills | Status: DC

## 2022-09-12 MED ORDER — AMLODIPINE BESYLATE 5 MG PO TABS: 5.0000 mg | ORAL_TABLET | Freq: Every day | ORAL | 3 refills | Status: DC

## 2022-09-12 NOTE — Addendum Note (Signed)
Addended by: Derrick Ravel on: 90/24/0973 03:08 PM   Modules accepted: Orders

## 2022-09-12 NOTE — Progress Notes (Signed)
Office Visit    Patient Name: Jeanette Mckee Date of Encounter: 09/12/2022  Primary Care Provider:  Pearline Cables, MD Primary Cardiologist:  Nicki Guadalajara, MD  Chief Complaint    71 year old female with a history of CAD, hypertension, hyperlipidemia, and possible OSA who presents for hospital follow-up related to CAD s/p NSTEMI.  Past Medical History    Past Medical History:  Diagnosis Date   Headache    History of chicken pox    childhood   Hypertension    Measles as a child   Other and unspecified hyperlipidemia 02/11/2013   Preventative health care 02/11/2013   Past Surgical History:  Procedure Laterality Date   ABDOMINAL HYSTERECTOMY  12/1994   and RSO & LS   LEFT HEART CATH AND CORONARY ANGIOGRAPHY N/A 09/03/2022   Procedure: LEFT HEART CATH AND CORONARY ANGIOGRAPHY;  Surgeon: Lennette Bihari, MD;  Location: [facility];  Service: Cardiovascular;  Laterality: N/A;    Allergies  No Known Allergies  History of Present Illness    71 year old female with the above past medical history including CAD, hypertension, hyperlipidemia, and possible OSA,  She presented to the ED on 09/03/2021 with sudden onset midsternal chest tightness with radiated to her left arm and jaw and awoke her from her sleep.  She had associated diaphoresis, nausea, and vomiting.  EKG with concern for STEMI.  She was transferred to [facility] for further evaluation and admitted per cardiology service.  Troponin peaked at 849.  She underwent cardiac catheterization which revealed multivessel CAD (p-mRCA 85%, p RCA 40% (small, nondominant), p-mLAD 55%, mLAD 40 %, d LAD 40%, mCX 30%, dCx 40%, D2-1 30%, D-2-2 20%, o-pLAD 20%).  Initial trial of medical therapy was recommended given small caliber vessels.  She was started on aspirin and Plavix, metoprolol, and Imdur. Echocardiogram showed EF 55%, G2 DD, no RWMA, normal RV size and function.  She did have concern for nocturnal hypoxemia, outpatient  sleep study was recommended.  She was discharged home in stable condition on 09/05/2022.  She presents today for follow-up.  Since her hospitalization is been stable from a cardiac standpoint.  She denies any recurrent chest pain.  Over the weekend she did notice some mild shortness of breath, generalized fatigue, nonpitting lower extremity edema.  She thinks that she overdid it activity wise.  She felt better after resting.  Today she states she feels the best she has felt in a long time.  She does note some intermittent lightheadedness, occasional headaches, he wonders if these are side effects of her new medication.  Otherwise, she reports feeling well and denies any additional concerns today.  Home Medications    Current Outpatient Medications  Medication Sig Dispense Refill   acetaminophen (TYLENOL) 500 MG tablet Take 500 mg by mouth daily as needed for mild pain or headache.     amLODipine (NORVASC) 5 MG tablet Take 1 tablet (5 mg total) by mouth daily. 90 tablet 3   aspirin 81 MG chewable tablet Chew 1 tablet (81 mg total) by mouth daily. 90 tablet 1   atorvastatin (LIPITOR) 80 MG tablet Take 1 tablet (80 mg total) by mouth daily. 90 tablet 1   clopidogrel (PLAVIX) 75 MG tablet Take 1 tablet (75 mg total) by mouth daily. 90 tablet 1   ibuprofen (ADVIL) 200 MG tablet Take 200 mg by mouth daily as needed for mild pain or headache.     isosorbide mononitrate (IMDUR) 30 MG 24 hr tablet  Take 1 tablet (30 mg total) by mouth daily. 90 tablet 0   losartan (COZAAR) 25 MG tablet Take 1 tablet (25 mg total) by mouth daily. 90 tablet 0   metoprolol tartrate (LOPRESSOR) 25 MG tablet Take 1 tablet (25 mg total) by mouth 2 (two) times daily. 180 tablet 0   Multiple Vitamin (MULTIVITAMIN) tablet Take 1 tablet by mouth daily.     nitroGLYCERIN (NITROSTAT) 0.4 MG SL tablet Place 1 tablet (0.4 mg total) under the tongue every 5 (five) minutes as needed for chest pain. 25 tablet 2   No current  facility-administered medications for this visit.     Review of Systems    She denies chest pain, palpitations, dyspnea, pnd, orthopnea, n, v, dizziness, syncope, edema, weight gain, or early satiety. All other systems reviewed and are otherwise negative except as noted above.     Cardiac Rehabilitation Eligibility Assessment  The patient is ready to start cardiac rehabilitation from a cardiac standpoint.    Physical Exam    VS:  BP 130/80   Pulse 66   Ht 5\' 6"  (1.676 m)   Wt 194 lb 6.4 oz (88.2 kg)   SpO2 95%   BMI 31.38 kg/m   GEN: Well nourished, well developed, in no acute distress. HEENT: normal. Neck: Supple, no JVD, carotid bruits, or masses. Cardiac: RRR, no murmurs, rubs, or gallops. No clubbing, cyanosis, edema.  Radials/DP/PT 2+ and equal bilaterally.  Right radial cath site with minimal bruising, no bleeding or hematoma. Respiratory:  Respirations regular and unlabored, clear to auscultation bilaterally. GI: Soft, nontender, nondistended, BS + x 4. MS: no deformity or atrophy. Skin: warm and dry, no rash. Neuro:  Strength and sensation are intact. Psych: Normal affect.  Accessory Clinical Findings    ECG personally reviewed by me today -NSR, 66 bpm- no acute changes.   Lab Results  Component Value Date   WBC 9.7 09/04/2022   HGB 12.6 09/04/2022   HCT 37.9 09/04/2022   MCV 89.2 09/04/2022   PLT 283 09/04/2022   Lab Results  Component Value Date   CREATININE 0.67 09/04/2022   BUN 15 09/04/2022   NA 135 09/04/2022   K 3.6 09/04/2022   CL 101 09/04/2022   CO2 24 09/04/2022   Lab Results  Component Value Date   ALT 24 03/16/2022   AST 22 03/16/2022   ALKPHOS 109 03/16/2022   BILITOT 0.5 03/16/2022   Lab Results  Component Value Date   CHOL 221 (H) 03/16/2022   HDL 83.10 03/16/2022   LDLCALC 125 (H) 03/16/2022   TRIG 62.0 03/16/2022   CHOLHDL 3 03/16/2022    Lab Results  Component Value Date   HGBA1C 5.7 03/16/2022    Assessment & Plan     1. CAD: S/p recent NSTEMI. Cath revealed multivessel CAD (p-mRCA 85%, p RCA 40% (small, nondominant), p-mLAD 55%, mLAD 40 %, d LAD 40%, mCX 30%, dCx 40%, D2-1 30%, D-2-2 20%, o-pLAD 20%).  Initial trial of medical therapy was recommended given small caliber vessels. Stable with no anginal symptoms.  She did note some mild dependent nonpitting lower extremity edema over the weekend, mild dyspnea.  This has since resolved.  Euvolemic and well compensated on exam.  She has noted some occasional lightheadedness and headaches.  Continue to monitor symptoms.  We discussed activity progression. She states that her estradiol vaginal cream was discontinued during her recent hospitalization.  She is asking if this can be resumed.  I will reach out  to Dr. Claiborne Billings for recommendations. Continue aspirin, Plavix, amlodipine, Imdur, losartan, metoprolol, and Lipitor.  2. Hypertension: BP well controlled. Continue current antihypertensive regimen.   3. Hyperlipidemia: LDL was 125 in 03/2022. Recently started on Lipitor. Will check fasting lipids, LFTs in 6 to 8 weeks.  Continue aspirin, Plavix, Lipitor.  4. Nocturnal hypoxemia: This occurred in the setting of NSTEMI. She denies any snoring, daytime somnolence, apneic episodes. Declines sleep study at this time. Continue to monitor.  5. Disposition: Follow-up in 2 to 3 months with APP, 5 to 6 months with Dr. Claiborne Billings.     Lenna Sciara, NP 09/12/2022, 2:58 PM

## 2022-09-12 NOTE — Patient Instructions (Addendum)
Medication Instructions:  Your physician recommends that you continue on your current medications as directed. Please refer to the Current Medication list given to you today.   *If you need a refill on your cardiac medications before your next appointment, please call your pharmacy*   Lab Work: Your physician recommends that you return for lab work in 6-7 weeks Fasting Lipid panel LFTs  If you have labs (blood work) drawn today and your tests are completely normal, you will receive your results only by: [place] (if you have MyChart) OR A paper copy in the mail If you have any lab test that is abnormal or we need to change your treatment, we will call you to review the results.   Testing/Procedures: NONE ordered at this time of appointment     Follow-Up: At [facility], you and your health needs are our priority.  As part of our continuing mission to provide you with exceptional heart care, we have created designated Provider Care Teams.  These Care Teams include your primary Cardiologist (physician) and Advanced Practice Providers (APPs -  Physician Assistants and Nurse Practitioners) who all work together to provide you with the care you need, when you need it.  We recommend signing up for the patient portal called "MyChart".  Sign up information is provided on this After Visit Summary.  MyChart is used to connect with patients for Virtual Visits (Telemedicine).  Patients are able to view lab/test results, encounter notes, upcoming appointments, etc.  Non-urgent messages can be sent to your provider as well.   To learn more about what you can do with MyChart, go to [email or web address].    Your next appointment:    2-3 Months [name] NP) 5-6 Months [name] MD) The format for your next appointment:   In Person  Provider:   [name], MD  or [name], NP        Other Instructions   Important Information About Sugar

## 2022-09-14 ENCOUNTER — Other Ambulatory Visit: Payer: Self-pay | Admitting: Family Medicine

## 2022-09-14 ENCOUNTER — Ambulatory Visit: Payer: Medicare Other | Admitting: Family Medicine

## 2022-09-14 VITALS — BP 124/70 | HR 65 | Temp 97.8°F | Resp 18 | Ht 66.0 in | Wt 194.8 lb

## 2022-09-14 DIAGNOSIS — N952 Postmenopausal atrophic vaginitis: Secondary | ICD-10-CM

## 2022-09-14 DIAGNOSIS — I214 Non-ST elevation (NSTEMI) myocardial infarction: Secondary | ICD-10-CM | POA: Diagnosis not present

## 2022-09-14 DIAGNOSIS — Z09 Encounter for follow-up examination after completed treatment for conditions other than malignant neoplasm: Secondary | ICD-10-CM

## 2022-09-14 DIAGNOSIS — I10 Essential (primary) hypertension: Secondary | ICD-10-CM

## 2022-09-14 NOTE — Patient Instructions (Addendum)
It was good to see you today- I am glad you are ok!   Plan for labs/ cholesterol recheck with cardiology within the next few months  Recommend flu shot, shingrix series and covid booster this fall  Let me know if you need anything at all

## 2022-09-25 ENCOUNTER — Ambulatory Visit (INDEPENDENT_AMBULATORY_CARE_PROVIDER_SITE_OTHER): Payer: Medicare Other | Admitting: *Deleted

## 2022-09-25 DIAGNOSIS — Z0000 Encounter for general adult medical examination without abnormal findings: Secondary | ICD-10-CM | POA: Diagnosis not present

## 2022-09-25 NOTE — Progress Notes (Signed)
Subjective:   Jeanette Mckee is a 71 y.o. female who presents for Medicare Annual (Subsequent) preventive examination.  I connected with  [name] on 09/25/22 by a audio enabled telemedicine application and verified that I am speaking with the correct person using two identifiers.  Patient Location: Home  Provider Location: Office/Clinic  I discussed the limitations of evaluation and management by telemedicine. The patient expressed understanding and agreed to proceed.   Review of Systems    Defer to PCP Cardiac Risk Factors include: advanced age (>110men, >51 women);dyslipidemia;hypertension     Objective:    There were no vitals filed for this visit. There is no height or weight on file to calculate BMI.     09/25/2022    8:23 AM 09/22/2021    8:26 AM 08/10/2021   10:04 AM 08/08/2021    9:51 AM 04/27/2020    8:00 AM 01/19/2020   11:09 AM 09/12/2017    3:18 PM  Advanced Directives  Does Patient Have a Medical Advance Directive? No No No No No No Yes  Type of Tax inspector;Living will  Copy of Healthcare Power of Attorney in Chart?       No - copy requested  Would patient like information on creating a medical advance directive? No - Patient declined No - Patient declined No - Patient declined Yes (MAU/Ambulatory/Procedural Areas - Information given) Yes (MAU/Ambulatory/Procedural Areas - Information given) No - Patient declined     Current Medications (verified) Outpatient Encounter Medications as of 09/25/2022  Medication Sig   acetaminophen (TYLENOL) 500 MG tablet Take 500 mg by mouth daily as needed for mild pain or headache.   amLODipine (NORVASC) 5 MG tablet Take 1 tablet (5 mg total) by mouth daily.   aspirin 81 MG chewable tablet Chew 1 tablet (81 mg total) by mouth daily.   atorvastatin (LIPITOR) 80 MG tablet Take 1 tablet (80 mg total) by mouth daily.   clopidogrel (PLAVIX) 75 MG tablet Take 1 tablet (75 mg total) by  mouth daily.   estradiol (ESTRACE) 0.1 MG/GM vaginal cream PLACE 1 APPLICATORFUL VAGINALLY  TWICE WEEKLY   ibuprofen (ADVIL) 200 MG tablet Take 200 mg by mouth daily as needed for mild pain or headache.   isosorbide mononitrate (IMDUR) 30 MG 24 hr tablet Take 1 tablet (30 mg total) by mouth daily.   losartan (COZAAR) 25 MG tablet Take 1 tablet (25 mg total) by mouth daily.   metoprolol tartrate (LOPRESSOR) 25 MG tablet Take 1 tablet (25 mg total) by mouth 2 (two) times daily.   Multiple Vitamin (MULTIVITAMIN) tablet Take 1 tablet by mouth daily.   nitroGLYCERIN (NITROSTAT) 0.4 MG SL tablet Place 1 tablet (0.4 mg total) under the tongue every 5 (five) minutes as needed for chest pain.   No facility-administered encounter medications on file as of 09/25/2022.    Allergies (verified) Morphine   History: Past Medical History:  Diagnosis Date   Headache    History of chicken pox    childhood   Hypertension    Measles as a child   Other and unspecified hyperlipidemia 02/11/2013   Preventative health care 02/11/2013   Past Surgical History:  Procedure Laterality Date   ABDOMINAL HYSTERECTOMY  12/1994   and RSO & LS   LEFT HEART CATH AND CORONARY ANGIOGRAPHY N/A 09/03/2022   Procedure: LEFT HEART CATH AND CORONARY ANGIOGRAPHY;  Surgeon: Lennette Bihari, MD;  Location: [facility]  INVASIVE CV LAB;  Service: Cardiovascular;  Laterality: N/A;   Family History  Problem Relation Age of Onset   Alcohol abuse Father    Heart disease Father    Stroke Father    Hypertension Father    Diabetes Father        type 2   Cancer Father        prostate   Heart disease Mother    Hypertension Mother    Arthritis Mother    Heart attack Mother 37   Crohn's disease Mother    Cancer Maternal Grandmother        bone- nonsmoker   Heart disease Son        MI s/p 3 stents, January 2015   Breast cancer Neg Hx    Colon cancer Neg Hx    Social History   Socioeconomic History   Marital status: Married     Spouse name: Not on file   Number of children: Not on file   Years of education: Not on file   Highest education level: Not on file  Occupational History   Not on file  Tobacco Use   Smoking status: Former    Years: 20.00    Types: Cigarettes    Quit date: 11/13/2001    Years since quitting: 20.8   Smokeless tobacco: Never   Tobacco comments:    quit in 2003 1 ppd for 20 years  Vaping Use   Vaping Use: Not on file  Substance and Sexual Activity   Alcohol use: Yes    Comment: WINE FRIDAY AND SATURDAY   Drug use: No   Sexual activity: Yes    Comment: lives with husband, retired from church, [place]. eats a heart healthy diet, minimal meats.exercises 6 days a week  Other Topics Concern   Not on file  Social History Narrative   Not on file   Social Determinants of Health   Financial Resource Strain: Low Risk  (09/22/2021)   Overall Financial Resource Strain (CARDIA)    Difficulty of Paying Living Expenses: Not hard at all  Food Insecurity: No Food Insecurity (09/25/2022)   Hunger Vital Sign    Worried About Running Out of Food in the Last Year: Never true    Ran Out of Food in the Last Year: Never true  Transportation Needs: No Transportation Needs (09/25/2022)   PRAPARE - Administrator, Civil Service (Medical): No    Lack of Transportation (Non-Medical): No  Physical Activity: Insufficiently Active (09/22/2021)   Exercise Vital Sign    Days of Exercise per Week: 7 days    Minutes of Exercise per Session: 20 min  Stress: No Stress Concern Present (09/22/2021)   [employer] of Occupational Health - Occupational Stress Questionnaire    Feeling of Stress : Not at all  Social Connections: Socially Integrated (09/22/2021)   Social Connection and Isolation Panel [NHANES]    Frequency of Communication with Friends and Family: More than three times a week    Frequency of Social Gatherings with Friends and Family: More than three times a week    Attends Religious  Services: More than 4 times per year    Active Member of [employer] or Organizations: Yes    Attends Engineer, structural: More than 4 times per year    Marital Status: Married    Tobacco Counseling Counseling given: Not Answered Tobacco comments: quit in 2003 1 ppd for 20 years   Clinical Intake:  Pre-visit preparation  completed: Yes  Pain : No/denies pain  Diabetes: No  How often do you need to have someone help you when you read instructions, pamphlets, or other written materials from your doctor or pharmacy?: 1 - Never   Activities of Daily Living    09/25/2022    8:24 AM  In your present state of health, do you have any difficulty performing the following activities:  Hearing? 1  Vision? 0  Difficulty concentrating or making decisions? 0  Walking or climbing stairs? 1  Dressing or bathing? 0  Doing errands, shopping? 0  Preparing Food and eating ? N  Using the Toilet? N  In the past six months, have you accidently leaked urine? N  Do you have problems with loss of bowel control? N  Managing your Medications? N  Managing your Finances? N  Housekeeping or managing your Housekeeping? N    Patient Care Team: Copland, Gwenlyn Found, MD as PCP - General (Family Medicine) Lennette Bihari, MD as PCP - Cardiology (Cardiology) Charna Elizabeth, MD as Consulting Physician (Gastroenterology)  Indicate any recent Medical Services you may have received from other than [facility] providers in the past year (date may be approximate).     Assessment:   This is a routine wellness examination for [place].  Hearing/Vision screen No results found.  Dietary issues and exercise activities discussed: Current Exercise Habits: The patient does not participate in regular exercise at present, Exercise limited by: cardiac condition(s) (MI 3 weeks ago)   Goals Addressed   None    Depression Screen    09/25/2022    8:24 AM 03/16/2022    8:17 AM 09/22/2021    8:31 AM 01/19/2020   11:14 AM  09/12/2017    3:19 PM 04/26/2016    8:37 AM  PHQ 2/9 Scores  PHQ - 2 Score 0 0 0 0 0 0    Fall Risk    09/25/2022    8:24 AM 03/16/2022    8:19 AM 03/16/2022    8:17 AM 09/22/2021    8:29 AM 01/19/2020   11:14 AM  Fall Risk   Falls in the past year? 0 1 0 1 0  Number falls in past yr: 0 1 0 1 0  Injury with Fall? 0 0 0 0 0  Risk for fall due to : No Fall Risks   History of fall(s)   Follow up Falls evaluation completed   Falls prevention discussed Education provided;Falls prevention discussed    FALL RISK PREVENTION PERTAINING TO THE HOME:  Any stairs in or around the home? No  If so, are there any without handrails?  No stairs Home free of loose throw rugs in walkways, pet beds, electrical cords, etc? Yes  Adequate lighting in your home to reduce risk of falls? Yes   ASSISTIVE DEVICES UTILIZED TO PREVENT FALLS:  Life alert? No  Use of a cane, walker or w/c? No  Grab bars in the bathroom? Yes  Shower chair or bench in shower? Yes  Elevated toilet seat or a handicapped toilet? Yes   TIMED UP AND GO:  Was the test performed?  No, audio visit .    Cognitive Function:    09/12/2017    3:20 PM  MMSE - Mini Mental State Exam  Orientation to time 5  Orientation to Place 5  Registration 3  Attention/ Calculation 5  Recall 3  Language- name 2 objects 2  Language- repeat 1  Language- follow 3 step command 3  Language- read & follow direction 1  Write a sentence 1  Copy design 1  Total score 30        09/25/2022    8:28 AM  6CIT Screen  What Year? 0 points  What month? 0 points  What time? 0 points  Count back from 20 0 points  Months in reverse 0 points  Repeat phrase 0 points  Total Score 0 points    Immunizations Immunization History  Administered Date(s) Administered   Fluad Quad(high Dose 65+) 10/08/2019   Influenza, High Dose Seasonal PF 07/20/2016, 09/05/2017, 10/02/2018   Moderna Sars-Covid-2 Vaccination 12/27/2019, 01/24/2020   Pneumococcal  Conjugate-13 02/03/2016   Pneumococcal Polysaccharide-23 09/05/2017   Tdap 10/02/2018    TDAP status: Up to date  Flu Vaccine status: Due, Education has been provided regarding the importance of this vaccine. Advised may receive this vaccine at local pharmacy or Health Dept. Aware to provide a copy of the vaccination record if obtained from local pharmacy or Health Dept. Verbalized acceptance and understanding.  Pneumococcal vaccine status: Up to date  Covid-19 vaccine status: Information provided on how to obtain vaccines.   Qualifies for Shingles Vaccine? Yes   Zostavax completed No   Shingrix Completed?: No.    Education has been provided regarding the importance of this vaccine. Patient has been advised to call insurance company to determine out of pocket expense if they have not yet received this vaccine. Advised may also receive vaccine at local pharmacy or Health Dept. Verbalized acceptance and understanding.  Screening Tests Health Maintenance  Topic Date Due   Zoster Vaccines- Shingrix (1 of 2) Never done   COVID-19 Vaccine (3 - Moderna series) 03/20/2020   INFLUENZA VACCINE  06/13/2022   Medicare Annual Wellness (AWV)  09/22/2022   MAMMOGRAM  03/27/2024   Fecal DNA (Cologuard)  07/25/2025   TETANUS/TDAP  10/02/2028   Pneumonia Vaccine 73+ Years old  Completed   DEXA SCAN  Completed   Hepatitis C Screening  Addressed   HPV VACCINES  Aged Out    Health Maintenance  Health Maintenance Due  Topic Date Due   Zoster Vaccines- Shingrix (1 of 2) Never done   COVID-19 Vaccine (3 - Moderna series) 03/20/2020   INFLUENZA VACCINE  06/13/2022   Medicare Annual Wellness (AWV)  09/22/2022    Colorectal cancer screening: Type of screening: Cologuard. Completed 07/25/22. Repeat every 3 years  Mammogram status: Completed 03/27/22. Repeat every year  Bone Density status: Completed 03/27/22. Results reflect: Bone density results: OSTEOPENIA. Repeat every 2 years.  Lung Cancer  Screening: (Low Dose CT Chest recommended if Age 46-80 years, 30 pack-year currently smoking OR have quit w/in 15years.) does not qualify.   Additional Screening:  Hepatitis C Screening: does qualify; Completed 11/13/05  Vision Screening: Recommended annual ophthalmology exams for early detection of glaucoma and other disorders of the eye. Is the patient up to date with their annual eye exam?  Yes  Who is the provider or what is the name of the office in which the patient attends annual eye exams? [facility] If pt is not established with a provider, would they like to be referred to a provider to establish care? No .   Dental Screening: Recommended annual dental exams for proper oral hygiene  Community Resource Referral / Chronic Care Management: CRR required this visit?  No   CCM required this visit?  No      Plan:     I have personally reviewed and  noted the following in the patient's chart:   Medical and social history Use of alcohol, tobacco or illicit drugs  Current medications and supplements including opioid prescriptions. Patient is not currently taking opioid prescriptions. Functional ability and status Nutritional status Physical activity Advanced directives List of other physicians Hospitalizations, surgeries, and ER visits in previous 12 months Vitals Screenings to include cognitive, depression, and falls Referrals and appointments  In addition, I have reviewed and discussed with patient certain preventive protocols, quality metrics, and best practice recommendations. A written personalized care plan for preventive services as well as general preventive health recommendations were provided to patient.   Due to this being a telephonic visit, the after visit summary with patients personalized plan was offered to patient via mail or my-chart. Patient would like to access on my-chart.  Donne AnonBender, Alecxander Mainwaring, [place]   09/25/2022   Nurse Notes: None

## 2022-09-25 NOTE — Patient Instructions (Signed)
Jeanette Mckee , Thank you for taking time to come for your Medicare Wellness Visit. I appreciate your ongoing commitment to your health goals. Please review the following plan we discussed and let me know if I can assist you in the future.   These are the goals we discussed:  Goals       Patient Stated      Lose some more weight & increase activity      Reduce other obligations and focus on family (pt-stated)        This is a list of the screening recommended for you and due dates:  Health Maintenance  Topic Date Due   Zoster (Shingles) Vaccine (1 of 2) Never done   COVID-19 Vaccine (3 - Moderna series) 03/20/2020   Flu Shot  06/13/2022   Medicare Annual Wellness Visit  09/26/2023   Mammogram  03/27/2024   Cologuard (Stool DNA test)  07/25/2025   Tetanus Vaccine  10/02/2028   Pneumonia Vaccine  Completed   DEXA scan (bone density measurement)  Completed   Hepatitis C Screening: USPSTF Recommendation to screen - Ages 62-79 yo.  Addressed   HPV Vaccine  Aged Out     Next appointment: Follow up in one year for your annual wellness visit    Preventive Care 65 Years and Older, Female Preventive care refers to lifestyle choices and visits with your health care provider that can promote health and wellness. What does preventive care include? A yearly physical exam. This is also called an annual well check. Dental exams once or twice a year. Routine eye exams. Ask your health care provider how often you should have your eyes checked. Personal lifestyle choices, including: Daily care of your teeth and gums. Regular physical activity. Eating a healthy diet. Avoiding tobacco and drug use. Limiting alcohol use. Practicing safe sex. Taking low-dose aspirin every day. Taking vitamin and mineral supplements as recommended by your health care provider. What happens during an annual well check? The services and screenings done by your health care provider during your annual well check  will depend on your age, overall health, lifestyle risk factors, and family history of disease. Counseling  Your health care provider may ask you questions about your: Alcohol use. Tobacco use. Drug use. Emotional well-being. Home and relationship well-being. Sexual activity. Eating habits. History of falls. Memory and ability to understand (cognition). Work and work Astronomer. Reproductive health. Screening  You may have the following tests or measurements: Height, weight, and BMI. Blood pressure. Lipid and cholesterol levels. These may be checked every 5 years, or more frequently if you are over 52 years old. Skin check. Lung cancer screening. You may have this screening every year starting at age 64 if you have a 30-pack-year history of smoking and currently smoke or have quit within the past 15 years. Fecal occult blood test (FOBT) of the stool. You may have this test every year starting at age 108. Flexible sigmoidoscopy or colonoscopy. You may have a sigmoidoscopy every 5 years or a colonoscopy every 10 years starting at age 88. Hepatitis C blood test. Hepatitis B blood test. Sexually transmitted disease (STD) testing. Diabetes screening. This is done by checking your blood sugar (glucose) after you have not eaten for a while (fasting). You may have this done every 1-3 years. Bone density scan. This is done to screen for osteoporosis. You may have this done starting at age 25. Mammogram. This may be done every 1-2 years. Talk to your health care  provider about how often you should have regular mammograms. Talk with your health care provider about your test results, treatment options, and if necessary, the need for more tests. Vaccines  Your health care provider may recommend certain vaccines, such as: Influenza vaccine. This is recommended every year. Tetanus, diphtheria, and acellular pertussis (Tdap, Td) vaccine. You may need a Td booster every 10 years. Zoster vaccine. You  may need this after age 48. Pneumococcal 13-valent conjugate (PCV13) vaccine. One dose is recommended after age 38. Pneumococcal polysaccharide (PPSV23) vaccine. One dose is recommended after age 7. Talk to your health care provider about which screenings and vaccines you need and how often you need them. This information is not intended to replace advice given to you by your health care provider. Make sure you discuss any questions you have with your health care provider. Document Released: 11/26/2015 Document Revised: 07/19/2016 Document Reviewed: 08/31/2015 Elsevier Interactive Patient Education  2017 [employer].  Fall Prevention in the Home Falls can cause injuries. They can happen to people of all ages. There are many things you can do to make your home safe and to help prevent falls. What can I do on the outside of my home? Regularly fix the edges of walkways and driveways and fix any cracks. Remove anything that might make you trip as you walk through a door, such as a raised step or threshold. Trim any bushes or trees on the path to your home. Use bright outdoor lighting. Clear any walking paths of anything that might make someone trip, such as rocks or tools. Regularly check to see if handrails are loose or broken. Make sure that both sides of any steps have handrails. Any raised decks and porches should have guardrails on the edges. Have any leaves, snow, or ice cleared regularly. Use sand or salt on walking paths during winter. Clean up any spills in your garage right away. This includes oil or grease spills. What can I do in the bathroom? Use night lights. Install grab bars by the toilet and in the tub and shower. Do not use towel bars as grab bars. Use non-skid mats or decals in the tub or shower. If you need to sit down in the shower, use a plastic, non-slip stool. Keep the floor dry. Clean up any water that spills on the floor as soon as it happens. Remove soap buildup  in the tub or shower regularly. Attach bath mats securely with double-sided non-slip rug tape. Do not have throw rugs and other things on the floor that can make you trip. What can I do in the bedroom? Use night lights. Make sure that you have a light by your bed that is easy to reach. Do not use any sheets or blankets that are too big for your bed. They should not hang down onto the floor. Have a firm chair that has side arms. You can use this for support while you get dressed. Do not have throw rugs and other things on the floor that can make you trip. What can I do in the kitchen? Clean up any spills right away. Avoid walking on wet floors. Keep items that you use a lot in easy-to-reach places. If you need to reach something above you, use a strong step stool that has a grab bar. Keep electrical cords out of the way. Do not use floor polish or wax that makes floors slippery. If you must use wax, use non-skid floor wax. Do not have throw rugs  and other things on the floor that can make you trip. What can I do with my stairs? Do not leave any items on the stairs. Make sure that there are handrails on both sides of the stairs and use them. Fix handrails that are broken or loose. Make sure that handrails are as long as the stairways. Check any carpeting to make sure that it is firmly attached to the stairs. Fix any carpet that is loose or worn. Avoid having throw rugs at the top or bottom of the stairs. If you do have throw rugs, attach them to the floor with carpet tape. Make sure that you have a light switch at the top of the stairs and the bottom of the stairs. If you do not have them, ask someone to add them for you. What else can I do to help prevent falls? Wear shoes that: Do not have high heels. Have rubber bottoms. Are comfortable and fit you well. Are closed at the toe. Do not wear sandals. If you use a stepladder: Make sure that it is fully opened. Do not climb a closed  stepladder. Make sure that both sides of the stepladder are locked into place. Ask someone to hold it for you, if possible. Clearly mark and make sure that you can see: Any grab bars or handrails. First and last steps. Where the edge of each step is. Use tools that help you move around (mobility aids) if they are needed. These include: Canes. Walkers. Scooters. Crutches. Turn on the lights when you go into a dark area. Replace any light bulbs as soon as they burn out. Set up your furniture so you have a clear path. Avoid moving your furniture around. If any of your floors are uneven, fix them. If there are any pets around you, be aware of where they are. Review your medicines with your doctor. Some medicines can make you feel dizzy. This can increase your chance of falling. Ask your doctor what other things that you can do to help prevent falls. This information is not intended to replace advice given to you by your health care provider. Make sure you discuss any questions you have with your health care provider. Document Released: 08/26/2009 Document Revised: 04/06/2016 Document Reviewed: 12/04/2014 Elsevier Interactive Patient Education  2017 [employer].

## 2022-10-23 DIAGNOSIS — I252 Old myocardial infarction: Secondary | ICD-10-CM | POA: Diagnosis not present

## 2022-10-23 DIAGNOSIS — E785 Hyperlipidemia, unspecified: Secondary | ICD-10-CM | POA: Diagnosis not present

## 2022-10-23 DIAGNOSIS — I10 Essential (primary) hypertension: Secondary | ICD-10-CM | POA: Diagnosis not present

## 2022-10-23 DIAGNOSIS — G4734 Idiopathic sleep related nonobstructive alveolar hypoventilation: Secondary | ICD-10-CM | POA: Diagnosis not present

## 2022-10-23 DIAGNOSIS — I25118 Atherosclerotic heart disease of native coronary artery with other forms of angina pectoris: Secondary | ICD-10-CM | POA: Diagnosis not present

## 2022-10-24 LAB — HEPATIC FUNCTION PANEL
ALT: 36 IU/L — ABNORMAL HIGH (ref 0–32)
AST: 32 IU/L (ref 0–40)
Albumin: 4.1 g/dL (ref 3.8–4.8)
Alkaline Phosphatase: 167 IU/L — ABNORMAL HIGH (ref 44–121)
Bilirubin Total: 0.4 mg/dL (ref 0.0–1.2)
Bilirubin, Direct: 0.16 mg/dL (ref 0.00–0.40)
Total Protein: 7 g/dL (ref 6.0–8.5)

## 2022-10-24 LAB — LIPID PANEL
Chol/HDL Ratio: 1.9 ratio (ref 0.0–4.4)
Cholesterol, Total: 147 mg/dL (ref 100–199)
HDL: 79 mg/dL (ref >39)
LDL Chol Calc (NIH): 58 mg/dL (ref 0–99)
Triglycerides: 45 mg/dL (ref 0–149)
VLDL Cholesterol Cal: 10 mg/dL (ref 5–40)

## 2022-10-30 ENCOUNTER — Encounter (HOSPITAL_COMMUNITY): Payer: Self-pay

## 2022-11-13 ENCOUNTER — Telehealth: Payer: Medicare Other | Admitting: Physician Assistant

## 2022-11-13 DIAGNOSIS — R6889 Other general symptoms and signs: Secondary | ICD-10-CM | POA: Diagnosis not present

## 2022-11-13 MED ORDER — BENZONATATE 100 MG PO CAPS: 100.0000 mg | ORAL_CAPSULE | Freq: Three times a day (TID) | ORAL | 0 refills | Status: DC | PRN

## 2022-11-13 MED ORDER — FLUTICASONE PROPIONATE 50 MCG/ACT NA SUSP: 2.0000 | Freq: Every day | NASAL | 0 refills | Status: AC

## 2022-11-13 NOTE — Progress Notes (Signed)
E visit for Flu like symptoms   We are sorry that you are not feeling well.  Here is how we plan to help! Based on what you have shared with me it looks like you may have flu-like symptoms that should be watched but do not seem to indicate anti-viral treatment.  Influenza or "the flu" is   an infection caused by a respiratory virus. The flu virus is highly contagious and persons who did not receive their yearly flu vaccination may "catch" the flu from close contact.  We have anti-viral medications to treat the viruses that cause this infection. They are not a "cure" and only shorten the course of the infection. These prescriptions are most effective when they are given within the first 2 days of "flu" symptoms. Antiviral medication are indicated if you have a high risk of complications from the flu. You should  also consider an antiviral medication if you are in close contact with someone who is at risk. These medications can help patients avoid complications from the flu  but have side effects that you should know. Possible side effects from Tamiflu or oseltamivir include nausea, vomiting, diarrhea, dizziness, headaches, eye redness, sleep problems or other respiratory symptoms. You should not take Tamiflu if you have an allergy to oseltamivir or any to the ingredients in Tamiflu.  Based upon your symptoms and potential risk factors I recommend that you follow the flu symptoms recommendation that I have listed below.  This is an infection that is most likely caused by a virus. There are no specific treatments other than to help you with the symptoms until the infection runs its course.  We are sorry you are not feeling well.  Here is how we plan to help!  For nasal congestion, you may use an oral decongestants such as Mucinex D or if you have glaucoma or high blood pressure use plain Mucinex.  Saline nasal spray or nasal drops can help and can safely be used as often as needed for congestion.  For  your congestion, I have prescribed Fluticasone nasal spray one spray in each nostril twice a day  If you do not have a history of heart disease, hypertension, diabetes or thyroid disease, prostate/bladder issues or glaucoma, you may also use Sudafed to treat nasal congestion.  It is highly recommended that you consult with a pharmacist or your primary care physician to ensure this medication is safe for you to take.     If you have a cough, you may use cough suppressants such as Delsym and Robitussin.  If you have glaucoma or high blood pressure, you can also use Coricidin HBP.   For cough I have prescribed for you A prescription cough medication called Tessalon Perles 100 mg. You may take 1-2 capsules every 8 hours as needed for cough  If you have a sore or scratchy throat, use a saltwater gargle-  to  teaspoon of salt dissolved in a 4-ounce to 8-ounce glass of warm water.  Gargle the solution for approximately 15-30 seconds and then spit.  It is important not to swallow the solution.  You can also use throat lozenges/cough drops and Chloraseptic spray to help with throat pain or discomfort.  Warm or cold liquids can also be helpful in relieving throat pain.  For headache, pain or general discomfort, you can use Ibuprofen or Tylenol as directed.   Some authorities believe that zinc sprays or the use of Echinacea may shorten the course of your symptoms.     ANYONE WHO HAS FLU SYMPTOMS SHOULD: Stay home. The flu is highly contagious and going out or to work exposes others! Be sure to drink plenty of fluids. Water is fine as well as fruit juices, sodas and electrolyte beverages. You may want to stay away from caffeine or alcohol. If you are nauseated, try taking small sips of liquids. How do you know if you are getting enough fluid? Your urine should be a pale yellow or almost colorless. Get rest. Taking a steamy shower or using a humidifier may help nasal congestion and ease sore throat pain. Using a  saline nasal spray works much the same way. Cough drops, hard candies and sore throat lozenges may ease your cough. Line up a caregiver. Have someone check on you regularly.   GET HELP RIGHT AWAY IF: You cannot keep down liquids or your medications. You become short of breath Your fell like you are going to pass out or loose consciousness. Your symptoms persist after you have completed your treatment plan MAKE SURE YOU  Understand these instructions. Will watch your condition. Will get help right away if you are not doing well or get worse.  Your e-visit answers were reviewed by a board certified advanced clinical practitioner to complete your personal care plan.  Depending on the condition, your plan could have included both over the counter or prescription medications.  If there is a problem please reply  once you have received a response from your provider.  Your safety is important to us.  If you have drug allergies check your prescription carefully.    You can use MyChart to ask questions about today's visit, request a non-urgent call back, or ask for a work or school excuse for 24 hours related to this e-Visit. If it has been greater than 24 hours you will need to follow up with your provider, or enter a new e-Visit to address those concerns.  You will get an e-mail in the next two days asking about your experience.  I hope that your e-visit has been valuable and will speed your recovery. Thank you for using e-visits.  I have spent 5 minutes in review of e-visit questionnaire, review and updating patient chart, medical decision making and response to patient.   Kiernan Atkerson M Georgios Kina, PA-C  

## 2022-11-15 ENCOUNTER — Ambulatory Visit: Payer: Medicare Other | Admitting: Nurse Practitioner

## 2022-11-27 ENCOUNTER — Ambulatory Visit: Payer: Medicare Other | Attending: Nurse Practitioner | Admitting: Nurse Practitioner

## 2022-11-27 ENCOUNTER — Encounter: Payer: Self-pay | Admitting: Nurse Practitioner

## 2022-11-27 ENCOUNTER — Other Ambulatory Visit: Payer: Self-pay

## 2022-11-27 VITALS — BP 138/70 | HR 64 | Ht 65.5 in | Wt 196.0 lb

## 2022-11-27 DIAGNOSIS — I10 Essential (primary) hypertension: Secondary | ICD-10-CM

## 2022-11-27 DIAGNOSIS — I2510 Atherosclerotic heart disease of native coronary artery without angina pectoris: Secondary | ICD-10-CM

## 2022-11-27 DIAGNOSIS — G4734 Idiopathic sleep related nonobstructive alveolar hypoventilation: Secondary | ICD-10-CM

## 2022-11-27 DIAGNOSIS — E785 Hyperlipidemia, unspecified: Secondary | ICD-10-CM

## 2022-11-27 DIAGNOSIS — R600 Localized edema: Secondary | ICD-10-CM

## 2022-11-27 DIAGNOSIS — R0602 Shortness of breath: Secondary | ICD-10-CM | POA: Diagnosis not present

## 2022-11-27 MED ORDER — FUROSEMIDE 20 MG PO TABS: 20.0000 mg | ORAL_TABLET | Freq: Every day | ORAL | 3 refills | Status: DC

## 2022-11-27 NOTE — Patient Instructions (Signed)
Medication Instructions:  Start Lasix 20 mg daily  *If you need a refill on your cardiac medications before your next appointment, please call your pharmacy*   Lab Work: Your physician recommends that you complete lab work today: BNP (Today) BMET (2 weeks)  If you have labs (blood work) drawn today and your tests are completely normal, you will receive your results only by: [place] (if you have MyChart) OR A paper copy in the mail If you have any lab test that is abnormal or we need to change your treatment, we will call you to review the results.   Testing/Procedures: NONE ordered at this time of appointment     Follow-Up: At [facility], you and your health needs are our priority.  As part of our continuing mission to provide you with exceptional heart care, we have created designated Provider Care Teams.  These Care Teams include your primary Cardiologist (physician) and Advanced Practice Providers (APPs -  Physician Assistants and Nurse Practitioners) who all work together to provide you with the care you need, when you need it.  We recommend signing up for the patient portal called "MyChart".  Sign up information is provided on this After Visit Summary.  MyChart is used to connect with patients for Virtual Visits (Telemedicine).  Patients are able to view lab/test results, encounter notes, upcoming appointments, etc.  Non-urgent messages can be sent to your provider as well.   To learn more about what you can do with MyChart, go to [email or web address].    Your next appointment:    Keep f/u as planned  Provider:   [name], MD     Other Instructions

## 2022-11-27 NOTE — Progress Notes (Signed)
Office Visit    Patient Name: Jeanette Mckee Date of Encounter: 11/27/2022  Primary Care Provider:  Darreld Mclean, MD Primary Cardiologist:  [name], MD  Chief Complaint    72 year old female with a history of CAD, hypertension, hyperlipidemia, and possible OSA who presents for follow-up related to CAD.  Past Medical History    Past Medical History:  Diagnosis Date   Headache    History of chicken pox    childhood   Hypertension    Measles as a child   Other and unspecified hyperlipidemia 02/11/2013   Preventative health care 02/11/2013   Past Surgical History:  Procedure Laterality Date   ABDOMINAL HYSTERECTOMY  12/1994   and RSO & LS   LEFT HEART CATH AND CORONARY ANGIOGRAPHY N/A 09/03/2022   Procedure: LEFT HEART CATH AND CORONARY ANGIOGRAPHY;  Surgeon: Troy Sine, MD;  Location: [facility];  Service: Cardiovascular;  Laterality: N/A;    Allergies  Allergies  Allergen Reactions   Morphine Itching    During 09/03/22 hospital stay pt received Morphine via IV. She describes having a local itchy reaction that subsided shortly after administration.     Labs/Other Studies Reviewed    The following studies were reviewed today: CARDIAC CATH: 09/02/2022   Prox RCA to Mid RCA lesion is 85% stenosed.   Prox RCA lesion is 40% stenosed.   Prox LAD to Mid LAD lesion is 55% stenosed.   Mid LAD lesion is 40% stenosed.   Dist LAD lesion is 40% stenosed.   Mid Cx lesion is 30% stenosed.   Dist Cx lesion is 40% stenosed.   2nd Diag-1 lesion is 30% stenosed.   2nd Diag-2 lesion is 20% stenosed.   Ost LAD to Prox LAD lesion is 20% stenosed.   The left ventricular ejection fraction is 45-50% by visual estimate.   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   Very large dominant left circumflex coronary artery with mild nonobstructive plaque in the AV groove circumflex.  The third marginal branch is  diminutive.  The distal circumflex supplies four vessel supplying the posterior and inferolateral wall.  There is a suggestion of very distal diminutive vessel collateralization collateralization at the apex.   Very small nondominant RCA with diffuse 40% proximal stenosis and 85% mid stenosis.  Vessel caliber appears taller than the 5 French (1.7 mm) diagnostic catheter.   Mild LV dysfunction with suggestion of infero-apical hypocontractility.  LVEDP 28 mmHg.   RECOMMENDATION: Initial attempt at medical therapy trial.  Plan 2D echo Doppler study today for further assessment of LV function.  Patient has been on amlodipine 5 mg and lisinopril HCT for blood pressure control; will add low-dose metoprolol tartrate and isosorbide. Aggressive lipid management with target LDL less than 70.            ECHO: 09/03/2022: 1. Left ventricular ejection fraction, by estimation, is 55%. The left  ventricle has normal function. The left ventricle has no regional wall  motion abnormalities. There is mild concentric left ventricular  hypertrophy. Left ventricular diastolic  parameters are consistent with Grade II diastolic dysfunction  (pseudonormalization).   2. Right ventricular systolic function is normal. The right ventricular  size is normal. There is normal pulmonary artery systolic pressure. The  estimated right ventricular systolic pressure is 10.9 mmHg.   3. The mitral valve is normal in structure. Trivial mitral valve  regurgitation. No evidence of mitral stenosis.  4. The aortic valve was not well visualized. Aortic valve regurgitation  is not visualized. No aortic stenosis is present.   5. The inferior vena cava is normal in size with greater than 50%  respiratory variability, suggesting right atrial pressure of 3 mmHg.    Recent Labs: 03/16/2022: Pro B Natriuretic peptide (BNP) 82.0; TSH 1.23 09/04/2022: BUN 15; Creatinine, Ser 0.67; Hemoglobin 12.6; Platelets 283; Potassium 3.6; Sodium  135 10/23/2022: ALT 36  Recent Lipid Panel    Component Value Date/Time   CHOL 147 10/23/2022 0846   TRIG 45 10/23/2022 0846   HDL 79 10/23/2022 0846   CHOLHDL 1.9 10/23/2022 0846   CHOLHDL 3 03/16/2022 0846   VLDL 12.4 03/16/2022 0846   LDLCALC 58 10/23/2022 0846    History of Present Illness    72 year old female with the above past medical history including CAD, hypertension, hyperlipidemia, and possible OSA,   She presented to the ED on 09/03/2021 with sudden onset midsternal chest tightness with radiated to her left arm and jaw and awoke her from her sleep.  She had associated diaphoresis, nausea, and vomiting.  EKG with concern for STEMI.  She was transferred to [facility] for further evaluation and admitted per cardiology service.  Troponin peaked at 849.  She underwent cardiac catheterization which revealed multivessel CAD (p-mRCA 85%, p RCA 40% (small, nondominant), p-mLAD 55%, mLAD 40 %, d LAD 40%, mCX 30%, dCx 40%, D2-1 30%, D-2-2 20%, o-pLAD 20%).  Initial trial of medical therapy was recommended given small caliber vessels.  She was started on aspirin and Plavix, metoprolol, and Imdur. Echocardiogram showed EF 55%, G2 DD, no RWMA, normal RV size and function.  She did have concern for nocturnal hypoxemia, outpatient sleep study was recommended.  She was last seen in the office on 09/12/2022 and was stable from a cardiac standpoint.  She denied any recurrent chest pain.  She presents today for follow-up. Since her last visit she has been stable overall from a cardiac standpoint.  She does note that despite having decreased her dietary intake she has experienced some weight gain. She also notes shortness of breath that occurs intermittently both at rest and with activity, nonpitting bilateral lower extremity edema.  She has been exercising regularly. She denies chest pain or dyspnea while exercising on the treadmill.  She denies palpitations, PND, orthopnea. She also states she is concerned  that her "liver enzymes are abnormal" on statin therapy.  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  Other than her shortness of breath, swelling, and weight gain, she denies any additional concerns today.  Home Medications    Current Outpatient Medications  Medication Sig Dispense Refill   acetaminophen (TYLENOL) 500 MG tablet Take 500 mg by mouth daily as needed for mild pain or headache.     amLODipine (NORVASC) 5 MG tablet Take 1 tablet (5 mg total) by mouth daily. 90 tablet 3   aspirin 81 MG chewable tablet Chew 1 tablet (81 mg total) by mouth daily. 90 tablet 1   atorvastatin (LIPITOR) 80 MG tablet Take 1 tablet (80 mg total) by mouth daily. 90 tablet 1   benzonatate (TESSALON) 100 MG capsule Take 1 capsule (100 mg total) by mouth 3 (three) times daily as needed. 30 capsule 0   clopidogrel (PLAVIX) 75 MG tablet Take 1 tablet (75 mg total) by mouth daily. 90 tablet 3   estradiol (ESTRACE) 0.1 MG/GM vaginal  cream PLACE 1 APPLICATORFUL VAGINALLY  TWICE WEEKLY 127.5 g 1   fluticasone (FLONASE) 50 MCG/ACT nasal spray Place 2 sprays into both nostrils daily. 16 g 0   furosemide (LASIX) 20 MG tablet Take 1 tablet (20 mg total) by mouth daily. 90 tablet 3   ibuprofen (ADVIL) 200 MG tablet Take 200 mg by mouth daily as needed for mild pain or headache.     isosorbide mononitrate (IMDUR) 30 MG 24 hr tablet Take 1 tablet (30 mg total) by mouth daily. 90 tablet 3   losartan (COZAAR) 25 MG tablet Take 1 tablet (25 mg total) by mouth daily. 90 tablet 3   metoprolol tartrate (LOPRESSOR) 25 MG tablet Take 1 tablet (25 mg total) by mouth 2 (two) times daily. 180 tablet 3   Multiple Vitamin (MULTIVITAMIN) tablet Take 1 tablet by mouth daily.     nitroGLYCERIN (NITROSTAT) 0.4 MG SL tablet Place 1 tablet (0.4 mg total) under the tongue every 5 (five) minutes as needed for chest pain. 25 tablet 2   No current  facility-administered medications for this visit.     Review of Systems    She denies chest pain, palpitations, pnd, orthopnea, n, v, dizziness, syncope, or early satiety. All other systems reviewed and are otherwise negative except as noted above.   Physical Exam    VS:  BP 138/70 (BP Location: Left Arm, Patient Position: Sitting, Cuff Size: Normal)   Pulse 64   Ht 5' 5.5" (1.664 m)   Wt 196 lb (88.9 kg)   BMI 32.12 kg/m  GEN: Well nourished, well developed, in no acute distress. HEENT: normal. Neck: Supple, no JVD, carotid bruits, or masses. Cardiac: RRR, no murmurs, rubs, or gallops. No clubbing, cyanosis, non pitting bilateral lower extremity edema.  Radials/DP/PT 2+ and equal bilaterally.  Respiratory:  Respirations regular and unlabored, clear to auscultation bilaterally. GI: Soft, nontender, nondistended, BS + x 4. MS: no deformity or atrophy. Skin: warm and dry, no rash. Neuro:  Strength and sensation are intact. Psych: Normal affect.  Accessory Clinical Findings    ECG personally reviewed by me today - No EKG in office today.    Lab Results  Component Value Date   WBC 9.7 09/04/2022   HGB 12.6 09/04/2022   HCT 37.9 09/04/2022   MCV 89.2 09/04/2022   PLT 283 09/04/2022   Lab Results  Component Value Date   CREATININE 0.67 09/04/2022   BUN 15 09/04/2022   NA 135 09/04/2022   K 3.6 09/04/2022   CL 101 09/04/2022   CO2 24 09/04/2022   Lab Results  Component Value Date   ALT 36 (H) 10/23/2022   AST 32 10/23/2022   ALKPHOS 167 (H) 10/23/2022   BILITOT 0.4 10/23/2022   Lab Results  Component Value Date   CHOL 147 10/23/2022   HDL 79 10/23/2022   LDLCALC 58 10/23/2022   TRIG 45 10/23/2022   CHOLHDL 1.9 10/23/2022    Lab Results  Component Value Date   HGBA1C 5.7 03/16/2022    Assessment & Plan    1. CAD: S/p NSTEMI. Cath revealed multivessel CAD (p-mRCA 85%, p RCA 40% (small, nondominant), p-mLAD 55%, mLAD 40 %, d LAD 40%, mCX 30%, dCx 40%, D2-1  30%, D-2-2 20%, o-pLAD 20%).  Initial trial of medical therapy was recommended given small caliber vessels.  She does have ongoing shortness of breath that occurs both at rest and with activity, however, her symptoms are intermittent.  She is tolerating exercising on a  treadmill without any symptoms of chest pain or dyspnea.  She does note recent bilateral lower extremity edema, weight gain as below.  Will trial Lasix as below.  If symptoms persist, consider need for further ischemic evaluation. Continue aspirin, Plavix, amlodipine, Imdur, losartan, metoprolol, and Lipitor.   2. Shortness of breath/bilateral lower extremity edema: Echo in 08/2021 showed EF 55%, G2 DD, no RWMA, normal RV size and function.  Since her heart attack she has noted ongoing shortness of breath that occurs and intermittently both at rest and with activity.  However, she is exercising regularly and appears to be tolerating this well.  She also notes weight gain despite decreased dietary intake.  She has nonpitting bilateral lower extremity edema, denies PND, orthopnea.  Will check BNP.  Will start Lasix 20 mg daily.  Will check BMET in 2 weeks.  3. Hypertension: BP well controlled. Continue current antihypertensive regimen.    4. Hyperlipidemia: LDL was 58 in 10/2022.  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  Will check CMET today.  If we see upward trend in liver enzymes, consider consulting lipid clinic Pharm.D to discuss alternative lipid-lowering therapy..  Continue aspirin, Plavix, Lipitor.   5. Nocturnal hypoxemia: Occurred in the setting of NSTEMI. Denies any snoring, daytime somnolence, apneic episodes. Declines sleep study.   6. Disposition: Follow-up as scheduled with Dr. Tresa Endo in 01/2023.      Joylene Grapes, NP 11/27/2022, 12:12 PM

## 2022-11-28 LAB — BRAIN NATRIURETIC PEPTIDE: BNP: 133.9 pg/mL — ABNORMAL HIGH (ref 0.0–100.0)

## 2022-11-29 ENCOUNTER — Telehealth: Payer: Self-pay

## 2022-11-29 NOTE — Telephone Encounter (Signed)
Spoke with pt/ pt was notified of lab results and recommendations. Pt will continue her current meds and f/u as planned.

## 2022-12-11 DIAGNOSIS — E785 Hyperlipidemia, unspecified: Secondary | ICD-10-CM | POA: Diagnosis not present

## 2022-12-11 DIAGNOSIS — I2510 Atherosclerotic heart disease of native coronary artery without angina pectoris: Secondary | ICD-10-CM | POA: Diagnosis not present

## 2022-12-11 DIAGNOSIS — G4734 Idiopathic sleep related nonobstructive alveolar hypoventilation: Secondary | ICD-10-CM | POA: Diagnosis not present

## 2022-12-11 DIAGNOSIS — I10 Essential (primary) hypertension: Secondary | ICD-10-CM | POA: Diagnosis not present

## 2022-12-12 LAB — BASIC METABOLIC PANEL
BUN/Creatinine Ratio: 21 (ref 12–28)
BUN: 12 mg/dL (ref 8–27)
CO2: 24 mmol/L (ref 20–29)
Calcium: 9.2 mg/dL (ref 8.7–10.3)
Chloride: 103 mmol/L (ref 96–106)
Creatinine, Ser: 0.58 mg/dL (ref 0.57–1.00)
Glucose: 103 mg/dL — ABNORMAL HIGH (ref 70–99)
Potassium: 4.5 mmol/L (ref 3.5–5.2)
Sodium: 143 mmol/L (ref 134–144)
eGFR: 95 mL/min/{1.73_m2} (ref >59)

## 2023-01-15 ENCOUNTER — Other Ambulatory Visit: Payer: Self-pay | Admitting: Nurse Practitioner

## 2023-02-02 ENCOUNTER — Encounter: Payer: Self-pay | Admitting: Cardiovascular Disease

## 2023-02-02 ENCOUNTER — Ambulatory Visit: Payer: Medicare Other | Attending: Cardiovascular Disease | Admitting: Cardiovascular Disease

## 2023-02-02 DIAGNOSIS — I2510 Atherosclerotic heart disease of native coronary artery without angina pectoris: Secondary | ICD-10-CM

## 2023-02-02 DIAGNOSIS — I252 Old myocardial infarction: Secondary | ICD-10-CM

## 2023-02-02 DIAGNOSIS — Z79899 Other long term (current) drug therapy: Secondary | ICD-10-CM

## 2023-02-02 DIAGNOSIS — I10 Essential (primary) hypertension: Secondary | ICD-10-CM | POA: Diagnosis not present

## 2023-02-02 DIAGNOSIS — E785 Hyperlipidemia, unspecified: Secondary | ICD-10-CM | POA: Diagnosis not present

## 2023-02-02 DIAGNOSIS — I5189 Other ill-defined heart diseases: Secondary | ICD-10-CM | POA: Diagnosis not present

## 2023-02-02 MED ORDER — LOSARTAN POTASSIUM 50 MG PO TABS: 50.0000 mg | ORAL_TABLET | Freq: Every day | ORAL | 3 refills | Status: DC

## 2023-02-02 MED ORDER — ATORVASTATIN CALCIUM 40 MG PO TABS: 40.0000 mg | ORAL_TABLET | Freq: Every day | ORAL | 3 refills | Status: DC

## 2023-02-02 MED ORDER — EZETIMIBE 10 MG PO TABS: 10.0000 mg | ORAL_TABLET | Freq: Every day | ORAL | 3 refills | Status: DC

## 2023-02-02 NOTE — Patient Instructions (Signed)
Medication Instructions:  INCREASE Losartan to 50 mg daily DECREASE atorvastatin (Lipitor) to 40 mg daily START Zetia 10 mg daily  *If you need a refill on your cardiac medications before your next appointment, please call your pharmacy*   Lab Work: Please return for FASTING labs in 4 months (CMET, Lipid)  Our in office lab hours are Monday-Friday 8:00-4:00, closed for lunch 12:45-1:45 pm.  No appointment needed.  [facility] locations:   [place] ([place] - [id number] [place] [address] [place] [address] - [address] [place] - [address] ([facility])  Follow-Up: At [facility], you and your health needs are our priority.  As part of our continuing mission to provide you with exceptional heart care, we have created designated Provider Care Teams.  These Care Teams include your primary Cardiologist (physician) and Advanced Practice Providers (APPs -  Physician Assistants and Nurse Practitioners) who all work together to provide you with the care you need, when you need it.  We recommend signing up for the patient portal called "MyChart".  Sign up information is provided on this After Visit Summary.  MyChart is used to connect with patients for Virtual Visits (Telemedicine).  Patients are able to view lab/test results, encounter notes, upcoming appointments, etc.  Non-urgent messages can be sent to your provider as well.   To learn more about what you can do with MyChart, go to [email or web address].    Your next appointment:   5 month(s)  Provider:   [name], MD

## 2023-02-02 NOTE — Progress Notes (Unsigned)
Cardiology Office Note    Date:  02/02/2023   ID:  Jeanette Mckee, DOB 09/29/1951, MRN [id number]  PCP:  Darreld Mclean, MD  Cardiologist:  [name], MD   No chief complaint on file.   History of Present Illness:  Jeanette Mckee is a 72 y.o. female ***    Past Medical History:  Diagnosis Date   Headache    History of chicken pox    childhood   Hypertension    Measles as a child   Other and unspecified hyperlipidemia 02/11/2013   Preventative health care 02/11/2013    Past Surgical History:  Procedure Laterality Date   ABDOMINAL HYSTERECTOMY  12/1994   and RSO & LS   LEFT HEART CATH AND CORONARY ANGIOGRAPHY N/A 09/03/2022   Procedure: LEFT HEART CATH AND CORONARY ANGIOGRAPHY;  Surgeon: Troy Sine, MD;  Location: [facility];  Service: Cardiovascular;  Laterality: N/A;    Current Medications: Outpatient Medications Prior to Visit  Medication Sig Dispense Refill   acetaminophen (TYLENOL) 500 MG tablet Take 500 mg by mouth daily as needed for mild pain or headache.     amLODipine (NORVASC) 5 MG tablet Take 1 tablet (5 mg total) by mouth daily. 90 tablet 3   aspirin 81 MG chewable tablet Chew 1 tablet (81 mg total) by mouth daily. 90 tablet 1   atorvastatin (LIPITOR) 80 MG tablet TAKE 1 TABLET BY MOUTH DAILY 90 tablet 3   benzonatate (TESSALON) 100 MG capsule Take 1 capsule (100 mg total) by mouth 3 (three) times daily as needed. 30 capsule 0   clopidogrel (PLAVIX) 75 MG tablet Take 1 tablet (75 mg total) by mouth daily. 90 tablet 3   estradiol (ESTRACE) 0.1 MG/GM vaginal cream PLACE 1 APPLICATORFUL VAGINALLY  TWICE WEEKLY 127.5 g 1   fluticasone (FLONASE) 50 MCG/ACT nasal spray Place 2 sprays into both nostrils daily. 16 g 0   furosemide (LASIX) 20 MG tablet Take 1 tablet (20 mg total) by mouth daily. 90 tablet 3   ibuprofen (ADVIL) 200 MG tablet Take 200 mg by mouth daily as needed for mild pain or headache.     isosorbide mononitrate (IMDUR) 30  MG 24 hr tablet Take 1 tablet (30 mg total) by mouth daily. 90 tablet 3   losartan (COZAAR) 25 MG tablet Take 1 tablet (25 mg total) by mouth daily. 90 tablet 3   metoprolol tartrate (LOPRESSOR) 25 MG tablet Take 1 tablet (25 mg total) by mouth 2 (two) times daily. 180 tablet 3   Multiple Vitamin (MULTIVITAMIN) tablet Take 1 tablet by mouth daily.     nitroGLYCERIN (NITROSTAT) 0.4 MG SL tablet Place 1 tablet (0.4 mg total) under the tongue every 5 (five) minutes as needed for chest pain. 25 tablet 2   No facility-administered medications prior to visit.     Allergies:   Morphine   Social History   Socioeconomic History   Marital status: Married    Spouse name: Not on file   Number of children: Not on file   Years of education: Not on file   Highest education level: Not on file  Occupational History   Not on file  Tobacco Use   Smoking status: Former    Years: 20    Types: Cigarettes    Quit date: 11/13/2001    Years since quitting: 21.2   Smokeless tobacco: Never   Tobacco comments:    quit in 2003 1 ppd for 20  years  Vaping Use   Vaping Use: Not on file  Substance and Sexual Activity   Alcohol use: Yes    Comment: WINE FRIDAY AND SATURDAY   Drug use: No   Sexual activity: Yes    Comment: lives with husband, retired from church, [place]. eats a heart healthy diet, minimal meats.exercises 6 days a week  Other Topics Concern   Not on file  Social History Narrative   Not on file   Social Determinants of Health   Financial Resource Strain: Low Risk  (09/22/2021)   Overall Financial Resource Strain (CARDIA)    Difficulty of Paying Living Expenses: Not hard at all  Food Insecurity: No Food Insecurity (09/25/2022)   Hunger Vital Sign    Worried About Running Out of Food in the Last Year: Never true    Ran Out of Food in the Last Year: Never true  Transportation Needs: No Transportation Needs (09/25/2022)   PRAPARE - Hydrologist (Medical): No     Lack of Transportation (Non-Medical): No  Physical Activity: Insufficiently Active (09/22/2021)   Exercise Vital Sign    Days of Exercise per Week: 7 days    Minutes of Exercise per Session: 20 min  Stress: No Stress Concern Present (09/22/2021)   [place]    Feeling of Stress : Not at all  Social Connections: [place] (09/22/2021)   Social Connection and Isolation Panel [NHANES]    Frequency of Communication with Friends and Family: More than three times a week    Frequency of Social Gatherings with Friends and Family: More than three times a week    Attends Religious Services: More than 4 times per year    Active Member of [employer] or Organizations: Yes    Attends Music therapist: More than 4 times per year    Marital Status: Married     Family History:  The patient's family history includes Alcohol abuse in her father; Arthritis in her mother; Cancer in her father and maternal grandmother; Crohn's disease in her mother; Diabetes in her father; Heart attack (age of onset: 43) in her mother; Heart disease in her father, mother, and son; Hypertension in her father and mother; Stroke in her father.   ROS General: Negative; No fevers, chills, or night sweats;  HEENT: Negative; No changes in vision or hearing, sinus congestion, difficulty swallowing Pulmonary: Negative; No cough, wheezing, shortness of breath, hemoptysis Cardiovascular: Negative; No chest pain, presyncope, syncope, palpitations GI: Negative; No nausea, vomiting, diarrhea, or abdominal pain GU: Negative; No dysuria, hematuria, or difficulty voiding Musculoskeletal: Negative; no myalgias, joint pain, or weakness Hematologic/Oncology: Negative; no easy bruising, bleeding Endocrine: Negative; no heat/cold intolerance; no diabetes Neuro: Negative; no changes in balance, headaches Skin: Negative; No rashes or skin lesions Psychiatric:  Negative; No behavioral problems, depression Sleep: Negative; No snoring, daytime sleepiness, hypersomnolence, bruxism, restless legs, hypnogognic hallucinations, no cataplexy Other comprehensive 14 point system review is negative.   PHYSICAL EXAM:   VS:  BP 138/68 (BP Location: Left Arm, Patient Position: Sitting, Cuff Size: Large)   Pulse (!) 57   Ht 5\' 5"  (1.651 m)   Wt 187 lb 3.2 oz (84.9 kg)   SpO2 95%   BMI 31.15 kg/m    Wt Readings from Last 3 Encounters:  02/02/23 187 lb 3.2 oz (84.9 kg)  11/27/22 196 lb (88.9 kg)  09/14/22 194 lb 12.8 oz (88.4 kg)  General: Alert, oriented, no distress.  Skin: normal turgor, no rashes, warm and dry HEENT: Normocephalic, atraumatic. Pupils equal round and reactive to light; sclera anicteric; extraocular muscles intact; Fundi ** Nose without nasal septal hypertrophy Mouth/Parynx benign; Mallinpatti scale Neck: No JVD, no carotid bruits; normal carotid upstroke Lungs: clear to ausculatation and percussion; no wheezing or rales Chest wall: without tenderness to palpitation Heart: PMI not displaced, RRR, s1 s2 normal, 1/6 systolic murmur, no diastolic murmur, no rubs, gallops, thrills, or heaves Abdomen: soft, nontender; no hepatosplenomehaly, BS+; abdominal aorta nontender and not dilated by palpation. Back: no CVA tenderness Pulses 2+ Musculoskeletal: full range of motion, normal strength, no joint deformities Extremities: no clubbing cyanosis or edema, Homan's sign negative  Neurologic: grossly nonfocal; Cranial nerves grossly wnl Psychologic: Normal mood and affect   Studies/Labs Reviewed:   March 22, 2024ECG (independently read by me):  Sinus bradycardia at 57, RBBB, LAHB  Recent Labs:    Latest Ref Rng & Units 12/11/2022    8:23 AM 09/04/2022   12:27 AM 09/03/2022    9:46 AM  BMP  Glucose 70 - 99 mg/dL 103  118    BUN 8 - 27 mg/dL 12  15    Creatinine 0.57 - 1.00 mg/dL 0.58  0.67  0.53   BUN/Creat Ratio 12 - 28 21      Sodium 134 - 144 mmol/L 143  135    Potassium 3.5 - 5.2 mmol/L 4.5  3.6    Chloride 96 - 106 mmol/L 103  101    CO2 20 - 29 mmol/L 24  24    Calcium 8.7 - 10.3 mg/dL 9.2  8.5          Latest Ref Rng & Units 10/23/2022    8:47 AM 03/16/2022    8:46 AM 10/14/2020    9:03 AM  Hepatic Function  Total Protein 6.0 - 8.5 g/dL 7.0  7.3  7.4   Albumin 3.8 - 4.8 g/dL 4.1  4.1  4.2   AST 0 - 40 IU/L 32  22  24   ALT 0 - 32 IU/L 36  24  24   Alk Phosphatase 44 - 121 IU/L 167  109  105   Total Bilirubin 0.0 - 1.2 mg/dL 0.4  0.5  0.5   Bilirubin, Direct 0.00 - 0.40 mg/dL 0.16          Latest Ref Rng & Units 09/04/2022   12:27 AM 09/03/2022    9:46 AM 09/03/2022    6:00 AM  CBC  WBC 4.0 - 10.5 K/uL 9.7  10.0  8.1   Hemoglobin 12.0 - 15.0 g/dL 12.6  15.6  15.3   Hematocrit 36.0 - 46.0 % 37.9  45.8  45.0   Platelets 150 - 400 K/uL 283  270  319    Lab Results  Component Value Date   MCV 89.2 09/04/2022   MCV 89.5 09/03/2022   MCV 88.2 09/03/2022   Lab Results  Component Value Date   TSH 1.23 03/16/2022   Lab Results  Component Value Date   HGBA1C 5.7 03/16/2022     BNP    Component Value Date/Time   BNP 133.9 (H) 11/27/2022 1127    ProBNP    Component Value Date/Time   PROBNP 82.0 03/16/2022 0846     Lipid Panel     Component Value Date/Time   CHOL 147 10/23/2022 0846   TRIG 45 10/23/2022 0846   HDL 79 10/23/2022 0846  CHOLHDL 1.9 10/23/2022 0846   CHOLHDL 3 03/16/2022 0846   VLDL 12.4 03/16/2022 0846   LDLCALC 58 10/23/2022 0846   LABVLDL 10 10/23/2022 0846     RADIOLOGY: No results found.   Additional studies/ records that were reviewed today include:  ***    ASSESSMENT:    No diagnosis found.   PLAN:  ***   Medication Adjustments/Labs and Tests Ordered: Current medicines are reviewed at length with the patient today.  Concerns regarding medicines are outlined above.  Medication changes, Labs and Tests ordered today are listed in the  Patient Instructions below. There are no Patient Instructions on file for this visit.   Signed, [name], MD  02/02/2023 11:09 AM    [facility] [address] [place], [place], [place]  [address] Phone: [phone]

## 2023-02-04 ENCOUNTER — Encounter: Payer: Self-pay | Admitting: Cardiovascular Disease

## 2023-02-25 ENCOUNTER — Other Ambulatory Visit: Payer: Self-pay | Admitting: Family Medicine

## 2023-02-25 DIAGNOSIS — N952 Postmenopausal atrophic vaginitis: Secondary | ICD-10-CM

## 2023-05-01 ENCOUNTER — Other Ambulatory Visit (HOSPITAL_BASED_OUTPATIENT_CLINIC_OR_DEPARTMENT_OTHER): Payer: Self-pay | Admitting: Family Medicine

## 2023-05-01 DIAGNOSIS — Z1231 Encounter for screening mammogram for malignant neoplasm of breast: Secondary | ICD-10-CM

## 2023-05-06 ENCOUNTER — Other Ambulatory Visit: Payer: Self-pay | Admitting: Family Medicine

## 2023-05-06 DIAGNOSIS — N952 Postmenopausal atrophic vaginitis: Secondary | ICD-10-CM

## 2023-05-07 ENCOUNTER — Ambulatory Visit (HOSPITAL_BASED_OUTPATIENT_CLINIC_OR_DEPARTMENT_OTHER)
Admission: RE | Admit: 2023-05-07 | Discharge: 2023-05-07 | Disposition: A | Discharge status: Home / Self Care | Payer: Medicare Other | Source: Ambulatory Visit | Attending: Family Medicine | Admitting: Family Medicine

## 2023-05-07 ENCOUNTER — Encounter (HOSPITAL_BASED_OUTPATIENT_CLINIC_OR_DEPARTMENT_OTHER): Payer: Self-pay

## 2023-05-07 DIAGNOSIS — Z1231 Encounter for screening mammogram for malignant neoplasm of breast: Secondary | ICD-10-CM | POA: Diagnosis not present

## 2023-05-10 ENCOUNTER — Telehealth: Payer: Self-pay | Admitting: Cardiovascular Disease

## 2023-05-10 NOTE — Telephone Encounter (Signed)
Patient received Zetia through mail order. She states she does not remember taking this med, though it is on her med list.  Advised this wa started 02/02/23 when Dr Tresa Endo reduced her Atorvastatin to the 40 mg dose.  She does remember the Atorvastatin reduction and has done this.  Advised the other name for this med is Ezetimibe and this would be the time frame for her refill.  She will check once she is home to make sure she has been on this medication.  If she has not been on it, then she will call as we may need to push labs out after starting the medication. She states understanding.

## 2023-05-10 NOTE — Telephone Encounter (Signed)
Pt c/o medication issue:  1. Name of Medication:   ezetimibe (ZETIA) 10 MG tablet    2. How are you currently taking this medication (dosage and times per day)?   Take 1 tablet (10 mg total) by mouth daily.    3. Are you having a reaction (difficulty breathing--STAT)? No  4. What is your medication issue? Pt states that although medication is on her current Med List. She just received it by mail order for the first time. Pt would like a callback regarding whether she is to take medication or not. Please advise

## 2023-05-23 ENCOUNTER — Other Ambulatory Visit: Payer: Self-pay | Admitting: Nurse Practitioner

## 2023-05-23 DIAGNOSIS — I10 Essential (primary) hypertension: Secondary | ICD-10-CM

## 2023-05-31 DIAGNOSIS — I10 Essential (primary) hypertension: Secondary | ICD-10-CM | POA: Diagnosis not present

## 2023-05-31 DIAGNOSIS — E785 Hyperlipidemia, unspecified: Secondary | ICD-10-CM | POA: Diagnosis not present

## 2023-05-31 DIAGNOSIS — I2510 Atherosclerotic heart disease of native coronary artery without angina pectoris: Secondary | ICD-10-CM | POA: Diagnosis not present

## 2023-06-01 LAB — COMPREHENSIVE METABOLIC PANEL
ALT: 33 IU/L — ABNORMAL HIGH (ref 0–32)
AST: 35 IU/L (ref 0–40)
Albumin: 4.5 g/dL (ref 3.8–4.8)
Alkaline Phosphatase: 187 IU/L — ABNORMAL HIGH (ref 44–121)
BUN/Creatinine Ratio: 21 (ref 12–28)
BUN: 13 mg/dL (ref 8–27)
Bilirubin Total: 0.6 mg/dL (ref 0.0–1.2)
CO2: 27 mmol/L (ref 20–29)
Calcium: 9.4 mg/dL (ref 8.7–10.3)
Chloride: 101 mmol/L (ref 96–106)
Creatinine, Ser: 0.61 mg/dL (ref 0.57–1.00)
Globulin, Total: 3.1 g/dL (ref 1.5–4.5)
Glucose: 105 mg/dL — ABNORMAL HIGH (ref 70–99)
Potassium: 4.4 mmol/L (ref 3.5–5.2)
Sodium: 140 mmol/L (ref 134–144)
Total Protein: 7.6 g/dL (ref 6.0–8.5)
eGFR: 94 mL/min/{1.73_m2} (ref >59)

## 2023-06-01 LAB — LIPID PANEL
Chol/HDL Ratio: 1.7 ratio (ref 0.0–4.4)
Cholesterol, Total: 146 mg/dL (ref 100–199)
HDL: 84 mg/dL (ref >39)
LDL Chol Calc (NIH): 52 mg/dL (ref 0–99)
Triglycerides: 44 mg/dL (ref 0–149)
VLDL Cholesterol Cal: 10 mg/dL (ref 5–40)

## 2023-06-01 NOTE — Patient Instructions (Signed)
It was great to see you again today!  I am glad all is going well for you I will be in touch with your labs asap We ordered a lung cancer screening CT for you- please stop by imaging on the ground floor to set this up

## 2023-06-01 NOTE — Progress Notes (Addendum)
[facility] [address], [address] [place], [place] [address] [phone]  Date:  06/06/2023   Name:  Jeanette Mckee   DOB:  May 20, 1951   MRN:  [id number]  PCP:  Pearline Cables, MD    Chief Complaint: Annual Exam (Concerns/ questions: none/Shingrix due- Medicare pt)   History of Present Illness:  Jeanette Mckee is a 72 y.o. very pleasant female patient who presents with the following:  Patient seen today for physical exam Most recent visit with myself was in November at that time she had recently been admitted with a STEMI treated medically.  In addition, previously history of hypertension, osteopenia, chronic back pain which improved after spinal fusion in September 2022 per Dr. Wynetta Emery   She has 2 children, 8 grandchildren, 3 great grands  Most recent visit with cardiology, Dr. Tresa Endo was in March, and she will see him again next month  1. History of non-ST elevation myocardial infarction (NSTEMI)   2. Coronary artery disease involving native coronary artery of native heart without angina pectoris   3. Essential hypertension   4. Hyperlipidemia LDL goal <70   5. Grade II diastolic dysfunction   6. Medication management    Recommend Shingrix- she will consider later on  Mammogram up-to-date Cologuard due in 2025 Bone density completed last year She has a 20-pack-year history, should qualify for lung cancer screening  She notes she is working out several times a week, no chest pain, has not needed nitroglycerin Her SOB is resolved  Blood work completed last week as follows  Results for orders placed or performed in visit on 02/02/23  Comprehensive metabolic panel  Result Value Ref Range   Glucose 105 (H) 70 - 99 mg/dL   BUN 13 8 - 27 mg/dL   Creatinine, Ser 2.95 0.57 - 1.00 mg/dL   eGFR 94 >28 UX/LKG/4.01   BUN/Creatinine Ratio 21 12 - 28   Sodium 140 134 - 144 mmol/L   Potassium 4.4 3.5 - 5.2 mmol/L    Chloride 101 96 - 106 mmol/L   CO2 27 20 - 29 mmol/L   Calcium 9.4 8.7 - 10.3 mg/dL   Total Protein 7.6 6.0 - 8.5 g/dL   Albumin 4.5 3.8 - 4.8 g/dL   Globulin, Total 3.1 1.5 - 4.5 g/dL   Bilirubin Total 0.6 0.0 - 1.2 mg/dL   Alkaline Phosphatase 187 (H) 44 - 121 IU/L   AST 35 0 - 40 IU/L   ALT 33 (H) 0 - 32 IU/L  Lipid panel  Result Value Ref Range   Cholesterol, Total 146 100 - 199 mg/dL   Triglycerides 44 0 - 149 mg/dL   HDL 84 >02 mg/dL   VLDL Cholesterol Cal 10 5 - 40 mg/dL   LDL Chol Calc (NIH) 52 0 - 99 mg/dL   Chol/HDL Ratio 1.7 0.0 - 4.4 ratio   Amlodipine 5 Aspirin 81 Lipitor 40 Plavix 75 Zetia 10 Estrogen cream Lasix 20 daily Losartan 50 Patient Active Problem List   Diagnosis Date Noted   ACS (acute coronary syndrome) (HCC)    Spinal stenosis at L4-L5 level 08/10/2021   Osteopenia 10/24/2019   Cold sore 04/19/2014   Postmenopausal estrogen deficiency 04/19/2014   Hyperlipidemia 02/11/2013   HTN (hypertension) 02/04/2012   Abnormal liver enzymes 02/04/2012   Chronic back pain 02/04/2012    Past Medical History:  Diagnosis Date   Headache    History of chicken  pox    childhood   Hypertension    Measles as a child   Other and unspecified hyperlipidemia 02/11/2013   Preventative health care 02/11/2013    Past Surgical History:  Procedure Laterality Date   ABDOMINAL HYSTERECTOMY  12/1994   and RSO & LS   LEFT HEART CATH AND CORONARY ANGIOGRAPHY N/A 09/03/2022   Procedure: LEFT HEART CATH AND CORONARY ANGIOGRAPHY;  Surgeon: Lennette Bihari, MD;  Location: [facility];  Service: Cardiovascular;  Laterality: N/A;    Social History   Tobacco Use   Smoking status: Former    Current packs/day: 0.00    Types: Cigarettes    Start date: 11/13/1981    Quit date: 11/13/2001    Years since quitting: 21.5   Smokeless tobacco: Never   Tobacco comments:    quit in 2003 1 ppd for 20 years  Substance Use Topics   Alcohol use: Yes    Comment: WINE FRIDAY  AND SATURDAY   Drug use: No    Family History  Problem Relation Age of Onset   Alcohol abuse Father    Heart disease Father    Stroke Father    Hypertension Father    Diabetes Father        type 2   Cancer Father        prostate   Heart disease Mother    Hypertension Mother    Arthritis Mother    Heart attack Mother 34   Crohn's disease Mother    Cancer Maternal Grandmother        bone- nonsmoker   Heart disease Son        MI s/p 3 stents, January 2015   Breast cancer Neg Hx    Colon cancer Neg Hx     Allergies  Allergen Reactions   Morphine Itching    During 09/03/22 hospital stay pt received Morphine via IV. She describes having a local itchy reaction that subsided shortly after administration.    Medication list has been reviewed and updated.  Current Outpatient Medications on File Prior to Visit  Medication Sig Dispense Refill   acetaminophen (TYLENOL) 500 MG tablet Take 500 mg by mouth daily as needed for mild pain or headache.     amLODipine (NORVASC) 5 MG tablet TAKE 1 TABLET BY MOUTH DAILY 100 tablet 2   aspirin 81 MG chewable tablet Chew 1 tablet (81 mg total) by mouth daily. 90 tablet 1   atorvastatin (LIPITOR) 40 MG tablet Take 1 tablet (40 mg total) by mouth daily. 90 tablet 3   clopidogrel (PLAVIX) 75 MG tablet Take 1 tablet (75 mg total) by mouth daily. 90 tablet 3   estradiol (ESTRACE) 0.1 MG/GM vaginal cream Place 1 Applicatorful vaginally 2 (two) times a week. 127.5 g 0   ezetimibe (ZETIA) 10 MG tablet Take 1 tablet (10 mg total) by mouth daily. 90 tablet 3   fluticasone (FLONASE) 50 MCG/ACT nasal spray Place 2 sprays into both nostrils daily. 16 g 0   furosemide (LASIX) 20 MG tablet Take 1 tablet (20 mg total) by mouth daily. 90 tablet 3   ibuprofen (ADVIL) 200 MG tablet Take 200 mg by mouth daily as needed for mild pain or headache.     isosorbide mononitrate (IMDUR) 30 MG 24 hr tablet Take 1 tablet (30 mg total) by mouth daily. 90 tablet 3    losartan (COZAAR) 50 MG tablet Take 1 tablet (50 mg total) by mouth daily. 90 tablet 3  metoprolol tartrate (LOPRESSOR) 25 MG tablet Take 1 tablet (25 mg total) by mouth 2 (two) times daily. 180 tablet 3   Multiple Vitamin (MULTIVITAMIN) tablet Take 1 tablet by mouth daily.     nitroGLYCERIN (NITROSTAT) 0.4 MG SL tablet Place 1 tablet (0.4 mg total) under the tongue every 5 (five) minutes as needed for chest pain. 25 tablet 2   No current facility-administered medications on file prior to visit.    Review of Systems:  As per HPI- otherwise negative.   Physical Examination: Vitals:   06/06/23 1316  BP: 124/60  Pulse: 72  Resp: 18  Temp: 98.6 F (37 C)  SpO2: 98%   Vitals:   06/06/23 1316  Weight: 178 lb 3.2 oz (80.8 kg)  Height: 5\' 6"  (1.676 m)   Body mass index is 28.76 kg/m. Ideal Body Weight: Weight in (lb) to have BMI = 25: 154.6  GEN: no acute distress. Overweight, looks well  HEENT: Atraumatic, Normocephalic.  Bilateral TM wnl, oropharynx normal.  PEERL,EOMI.   Ears and Nose: No external deformity. CV: RRR, No M/G/R. No JVD. No thrill. No extra heart sounds. PULM: CTA B, no wheezes, crackles, rhonchi. No retractions. No resp. distress. No accessory muscle use. ABD: S, NT, ND, +BS. No rebound. No HSM. EXTR: No c/c/e PSYCH: Normally interactive. Conversant.    Assessment and Plan: Physical exam  Essential hypertension - Plan: CBC  NSTEMI (non-ST elevated myocardial infarction) (HCC)  Mixed hyperlipidemia  Elevated glucose - Plan: Hemoglobin A1c  History of smoking - Plan: CT CHEST LUNG CA SCREEN LOW DOSE W/O CM  Physical exam today.  Encouraged healthy diet and exercise routine Ordered lung cancer screening CT  Will plan further follow- up pending labs. Continue follow-up with cardiology  Signed Abbe Amsterdam, MD  Addnd 7/25- received labs as below, message to pt  Results for orders placed or performed in visit on 06/06/23  Hemoglobin A1c   Result Value Ref Range   Hgb A1c MFr Bld 5.7 4.6 - 6.5 %  CBC  Result Value Ref Range   WBC 6.5 4.0 - 10.5 K/uL   RBC 4.46 3.87 - 5.11 Mil/uL   Platelets 273.0 150.0 - 400.0 K/uL   Hemoglobin 13.3 12.0 - 15.0 g/dL   HCT 16.1 09.6 - 04.5 %   MCV 90.7 78.0 - 100.0 fl   MCHC 32.9 30.0 - 36.0 g/dL   RDW 40.9 81.1 - 91.4 %  TSH  Result Value Ref Range   TSH 1.05 0.35 - 5.50 uIU/mL

## 2023-06-06 ENCOUNTER — Ambulatory Visit (INDEPENDENT_AMBULATORY_CARE_PROVIDER_SITE_OTHER): Payer: Medicare Other | Admitting: Family Medicine

## 2023-06-06 VITALS — BP 124/60 | HR 72 | Temp 98.6°F | Resp 18 | Ht 66.0 in | Wt 178.2 lb

## 2023-06-06 DIAGNOSIS — Z0000 Encounter for general adult medical examination without abnormal findings: Secondary | ICD-10-CM | POA: Diagnosis not present

## 2023-06-06 DIAGNOSIS — I214 Non-ST elevation (NSTEMI) myocardial infarction: Secondary | ICD-10-CM | POA: Diagnosis not present

## 2023-06-06 DIAGNOSIS — I10 Essential (primary) hypertension: Secondary | ICD-10-CM

## 2023-06-06 DIAGNOSIS — Z1329 Encounter for screening for other suspected endocrine disorder: Secondary | ICD-10-CM | POA: Diagnosis not present

## 2023-06-06 DIAGNOSIS — R7309 Other abnormal glucose: Secondary | ICD-10-CM

## 2023-06-06 DIAGNOSIS — E782 Mixed hyperlipidemia: Secondary | ICD-10-CM

## 2023-06-06 DIAGNOSIS — Z87891 Personal history of nicotine dependence: Secondary | ICD-10-CM

## 2023-06-07 ENCOUNTER — Encounter: Payer: Self-pay | Admitting: Family Medicine

## 2023-06-07 LAB — CBC
HCT: 40.4 % (ref 36.0–46.0)
Hemoglobin: 13.3 g/dL (ref 12.0–15.0)
MCHC: 32.9 g/dL (ref 30.0–36.0)
Platelets: 273 10*3/uL (ref 150.0–400.0)
RBC: 4.46 Mil/uL (ref 3.87–5.11)
RDW: 13.7 % (ref 11.5–15.5)
WBC: 6.5 10*3/uL (ref 4.0–10.5)

## 2023-06-07 LAB — TSH: TSH: 1.05 u[IU]/mL (ref 0.35–5.50)

## 2023-06-14 ENCOUNTER — Telehealth: Payer: Self-pay | Admitting: Licensed Clinical Social Worker

## 2023-06-14 NOTE — Patient Outreach (Signed)
  Care Coordination   06/14/2023 Name: Jeanette Mckee MRN: [id number] DOB: Jun 21, 1951   Care Coordination Outreach Attempts:  An unsuccessful telephone outreach was attempted today to offer the patient information about available care coordination services.  Follow Up Plan:  Additional outreach attempts will be made to offer the patient care coordination information and services.   Encounter Outcome:  No Answer   Care Coordination Interventions:  No, not indicated

## 2023-06-25 ENCOUNTER — Encounter (HOSPITAL_BASED_OUTPATIENT_CLINIC_OR_DEPARTMENT_OTHER): Payer: Self-pay

## 2023-06-25 ENCOUNTER — Ambulatory Visit (HOSPITAL_BASED_OUTPATIENT_CLINIC_OR_DEPARTMENT_OTHER)
Admission: RE | Admit: 2023-06-25 | Discharge: 2023-06-25 | Disposition: A | Discharge status: Home / Self Care | Payer: Medicare Other | Source: Ambulatory Visit | Attending: Family Medicine | Admitting: Family Medicine

## 2023-06-25 DIAGNOSIS — Z87891 Personal history of nicotine dependence: Secondary | ICD-10-CM

## 2023-07-02 ENCOUNTER — Other Ambulatory Visit: Payer: Self-pay | Admitting: Nurse Practitioner

## 2023-07-05 NOTE — Progress Notes (Deleted)
Cardiology Office Note    Date:  07/05/2023   ID:  Jeanette Mckee, DOB 16-Aug-1951, MRN [id number]  PCP:  Pearline Cables, MD  Cardiologist:  Nicki Guadalajara, MD   5 month F/U office visit    History of Present Illness:  Jeanette Mckee is a 72 y.o. female who I saw for initial cardiology evaluation when she was hospitalized on September 03, 2022.  She has a history of hypertension at the time treated with amlodipine 5 mg and lisinopril HCT.  She had a remote history of tobacco use and quit smoking on November 13, 2001.  She was awakened with substernal chest pain leading to her presentation at [facility].  ECG showed 1 mm ST elevation in leads I and aVL with T wave inversion in leads III and aVF.  A code STEMI was activated.  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  Consequently, medical therapy was recommended.  She was discharged on September 05, 2022.  She was discharged on amlodipine 5 mg, isosorbide 30 mg, losartan 25 mg, metoprolol tartrate 25 mg twice a day, in addition to aspirin and clopidogrel.  Since hospital discharge, she has been seen in the office on 2 occasions by Anice Paganini, NP initially on September 12, 2022 and more recently on November 27, 2022.  She has remained stable without chest pain.  I saw her for my initial evaluation following her hospitalization on February 02, 2023.  At that time, she felt well and denied any recurrent chest pain or shortness of breath.  She is now back living at her home where previously she had to live elsewhere as it was being renovated.  She goes to the gym 4 days/week and spends 25 minutes  on a treadmill and 25 minutes doing strength training.  An echo Doppler study done during her hospitalization on September 04, 2022 showed an EF at 55% without wall motion abnormality and grade 2 diastolic dysfunction.  Laboratory in December 2023 showed minimal ALT elevation at 36 with normal AST.  She was concerned about her liver enzymes.  Laboratory at that time also showed total cholesterol 147 triglycerides 45 HDL 79 and LDL 58.  During her hospitalization, LP(a) was normal at 14.8.  She presents for initial office evaluation with me.   Past Medical History:  Diagnosis Date   Headache    History of chicken pox    childhood   Hypertension    Measles as a child   Other and unspecified hyperlipidemia 02/11/2013   Preventative health care 02/11/2013    Past Surgical History:  Procedure Laterality Date   ABDOMINAL HYSTERECTOMY  12/1994   and RSO & LS   LEFT HEART CATH AND CORONARY ANGIOGRAPHY N/A 09/03/2022   Procedure: LEFT HEART CATH AND CORONARY ANGIOGRAPHY;  Surgeon: Lennette Bihari, MD;  [facility];  Service: Cardiovascular;  Laterality: N/A;    Current Medications: Outpatient Medications Prior to Visit  Medication Sig Dispense Refill   acetaminophen (TYLENOL) 500 MG tablet Take 500 mg by mouth daily as needed for mild pain or headache.  amLODipine (NORVASC) 5 MG tablet TAKE 1 TABLET BY MOUTH DAILY 100 tablet 2   aspirin 81 MG chewable tablet Chew 1 tablet (81 mg total) by mouth daily. 90 tablet 1   atorvastatin (LIPITOR) 40 MG tablet Take 1 tablet (40 mg total) by mouth daily. 90 tablet 3   clopidogrel (PLAVIX) 75 MG tablet TAKE 1 TABLET BY MOUTH DAILY 100 tablet 2   estradiol (ESTRACE) 0.1 MG/GM vaginal cream Place 1 Applicatorful vaginally 2 (two) times a week. 127.5 g 0   ezetimibe (ZETIA) 10 MG tablet Take 1 tablet (10 mg total) by mouth daily. 90 tablet 3   fluticasone (FLONASE) 50 MCG/ACT nasal spray Place 2 sprays into both nostrils daily. 16 g 0    furosemide (LASIX) 20 MG tablet Take 1 tablet (20 mg total) by mouth daily. 90 tablet 3   ibuprofen (ADVIL) 200 MG tablet Take 200 mg by mouth daily as needed for mild pain or headache.     isosorbide mononitrate (IMDUR) 30 MG 24 hr tablet TAKE 1 TABLET BY MOUTH DAILY 100 tablet 2   losartan (COZAAR) 50 MG tablet Take 1 tablet (50 mg total) by mouth daily. 90 tablet 3   metoprolol tartrate (LOPRESSOR) 25 MG tablet TAKE 1 TABLET BY MOUTH TWICE  DAILY 200 tablet 2   Multiple Vitamin (MULTIVITAMIN) tablet Take 1 tablet by mouth daily.     nitroGLYCERIN (NITROSTAT) 0.4 MG SL tablet Place 1 tablet (0.4 mg total) under the tongue every 5 (five) minutes as needed for chest pain. 25 tablet 2   No facility-administered medications prior to visit.     Allergies:   Morphine   Social History   Socioeconomic History   Marital status: Married    Spouse name: Not on file   Number of children: Not on file   Years of education: Not on file   Highest education level: Not on file  Occupational History   Not on file  Tobacco Use   Smoking status: Former    Current packs/day: 0.00    Types: Cigarettes    Start date: 11/13/1981    Quit date: 11/13/2001    Years since quitting: 21.6   Smokeless tobacco: Never   Tobacco comments:    quit in 2003 1 ppd for 20 years  Vaping Use   Vaping status: Not on file  Substance and Sexual Activity   Alcohol use: Yes    Comment: WINE FRIDAY AND SATURDAY   Drug use: No   Sexual activity: Yes    Comment: lives with husband, retired from church, [place]. eats a heart healthy diet, minimal meats.exercises 6 days a week  Other Topics Concern   Not on file  Social History Narrative   Not on file   Social Determinants of Health   Financial Resource Strain: Low Risk  (09/22/2021)   Overall Financial Resource Strain (CARDIA)    Difficulty of Paying Living Expenses: Not hard at all  Food Insecurity: No Food Insecurity (09/25/2022)   Hunger Vital Sign    Worried About  Running Out of Food in the Last Year: Never true    Ran Out of Food in the Last Year: Never true  Transportation Needs: No Transportation Needs (09/25/2022)   PRAPARE - Administrator, Civil Service (Medical): No    Lack of Transportation (Non-Medical): No  Physical Activity: Insufficiently Active (09/22/2021)   Exercise Vital Sign    Days of Exercise per Week: 7 days    Minutes  of Exercise per Session: 20 min  Stress: No Stress Concern Present (09/22/2021)   [employer] of Occupational Health - Occupational Stress Questionnaire    Feeling of Stress : Not at all  Social Connections: Socially Integrated (09/22/2021)   Social Connection and Isolation Panel [NHANES]    Frequency of Communication with Friends and Family: More than three times a week    Frequency of Social Gatherings with Friends and Family: More than three times a week    Attends Religious Services: More than 4 times per year    Active Member of [employer] or Organizations: Yes    Attends Engineer, structural: More than 4 times per year    Marital Status: Married     Family History:  The patient's family history includes Alcohol abuse in her father; Arthritis in her mother; Cancer in her father and maternal grandmother; Crohn's disease in her mother; Diabetes in her father; Heart attack (age of onset: 66) in her mother; Heart disease in her father, mother, and son; Hypertension in her father and mother; Stroke in her father.   ROS General: Negative; No fevers, chills, or night sweats;  HEENT: Negative; No changes in vision or hearing, sinus congestion, difficulty swallowing Pulmonary: Negative; No cough, wheezing, shortness of breath, hemoptysis Cardiovascular: See HPI GI: Negative; No nausea, vomiting, diarrhea, or abdominal pain GU: Negative; No dysuria, hematuria, or difficulty voiding Musculoskeletal: Negative; no myalgias, joint pain, or weakness Hematologic/Oncology: Negative; no easy bruising,  bleeding Endocrine: Negative; no heat/cold intolerance; no diabetes Neuro: Negative; no changes in balance, headaches Skin: Negative; No rashes or skin lesions Psychiatric: Negative; No behavioral problems, depression Sleep: Negative; No snoring, daytime sleepiness, hypersomnolence, bruxism, restless legs, hypnogognic hallucinations, no cataplexy Other comprehensive 14 point system review is negative.   PHYSICAL EXAM:   VS:  There were no vitals taken for this visit.    Repeat blood pressure by me was elevated at 164/78  Wt Readings from Last 3 Encounters:  06/06/23 178 lb 3.2 oz (80.8 kg)  02/02/23 187 lb 3.2 oz (84.9 kg)  11/27/22 196 lb (88.9 kg)      Physical Exam There were no vitals taken for this visit. General: Alert, oriented, no distress.  Skin: normal turgor, no rashes, warm and dry HEENT: Normocephalic, atraumatic. Pupils equal round and reactive to light; sclera anicteric; extraocular muscles intact; Fundi ** Nose without nasal septal hypertrophy Mouth/Parynx benign; Mallinpatti scale Neck: No JVD, no carotid bruits; normal carotid upstroke Lungs: clear to ausculatation and percussion; no wheezing or rales Chest wall: without tenderness to palpitation Heart: PMI not displaced, RRR, s1 s2 normal, 1/6 systolic murmur, no diastolic murmur, no rubs, gallops, thrills, or heaves Abdomen: soft, nontender; no hepatosplenomehaly, BS+; abdominal aorta nontender and not dilated by palpation. Back: no CVA tenderness Pulses 2+ Musculoskeletal: full range of motion, normal strength, no joint deformities Extremities: no clubbing cyanosis or edema, Homan's sign negative  Neurologic: grossly nonfocal; Cranial nerves grossly wnl Psychologic: Normal mood and affect    General: Alert, oriented, no distress.  Skin: normal turgor, no rashes, warm and dry HEENT: Normocephalic, atraumatic. Pupils equal round and reactive to light; sclera anicteric; extraocular muscles intact;  Nose  without nasal septal hypertrophy Mouth/Parynx benign; Mallinpatti scale 3 Neck: No JVD, no carotid bruits; normal carotid upstroke Lungs: clear to ausculatation and percussion; no wheezing or rales Chest wall: without tenderness to palpitation Heart: PMI not displaced, RRR, s1 s2 normal, 1/6 systolic murmur, no diastolic murmur, no rubs, gallops, thrills,  or heaves Abdomen: soft, nontender; no hepatosplenomehaly, BS+; abdominal aorta nontender and not dilated by palpation. Back: no CVA tenderness Pulses 2+ Musculoskeletal: full range of motion, normal strength, no joint deformities Extremities: no clubbing cyanosis or edema, Homan's sign negative  Neurologic: grossly nonfocal; Cranial nerves grossly wnl Psychologic: Normal mood and affect   Studies/Labs Reviewed:   February 02, 2023 ECG (independently read by me):  Sinus bradycardia at 57, RBBB, LAHB  Recent Labs:    Latest Ref Rng & Units 05/31/2023    8:43 AM 12/11/2022    8:23 AM 09/04/2022   12:27 AM  BMP  Glucose 70 - 99 mg/dL 213  086  578   BUN 8 - 27 mg/dL 13  12  15    Creatinine 0.57 - 1.00 mg/dL 4.69  6.29  5.28   BUN/Creat Ratio 12 - 28 21  21     Sodium 134 - 144 mmol/L 140  143  135   Potassium 3.5 - 5.2 mmol/L 4.4  4.5  3.6   Chloride 96 - 106 mmol/L 101  103  101   CO2 20 - 29 mmol/L 27  24  24    Calcium 8.7 - 10.3 mg/dL 9.4  9.2  8.5         Latest Ref Rng & Units 05/31/2023    8:43 AM 10/23/2022    8:47 AM 03/16/2022    8:46 AM  Hepatic Function  Total Protein 6.0 - 8.5 g/dL 7.6  7.0  7.3   Albumin 3.8 - 4.8 g/dL 4.5  4.1  4.1   AST 0 - 40 IU/L 35  32  22   ALT 0 - 32 IU/L 33  36  24   Alk Phosphatase 44 - 121 IU/L 187  167  109   Total Bilirubin 0.0 - 1.2 mg/dL 0.6  0.4  0.5   Bilirubin, Direct 0.00 - 0.40 mg/dL  4.13         Latest Ref Rng & Units 06/06/2023    1:58 PM 09/04/2022   12:27 AM 09/03/2022    9:46 AM  CBC  WBC 4.0 - 10.5 K/uL 6.5  9.7  10.0   Hemoglobin 12.0 - 15.0 g/dL 24.4  01.0  27.2    Hematocrit 36.0 - 46.0 % 40.4  37.9  45.8   Platelets 150.0 - 400.0 K/uL 273.0  283  270    Lab Results  Component Value Date   MCV 90.7 06/06/2023   MCV 89.2 09/04/2022   MCV 89.5 09/03/2022   Lab Results  Component Value Date   TSH 1.05 06/06/2023   Lab Results  Component Value Date   HGBA1C 5.7 06/06/2023     BNP    Component Value Date/Time   BNP 133.9 (H) 11/27/2022 1127    ProBNP    Component Value Date/Time   PROBNP 82.0 03/16/2022 0846     Lipid Panel     Component Value Date/Time   CHOL 146 05/31/2023 0843   TRIG 44 05/31/2023 0843   HDL 84 05/31/2023 0843   CHOLHDL 1.7 05/31/2023 0843   CHOLHDL 3 03/16/2022 0846   VLDL 12.4 03/16/2022 0846   LDLCALC 52 05/31/2023 0843   LABVLDL 10 05/31/2023 0843     RADIOLOGY: No results found.   Additional studies/ records that were reviewed today include:   CARDIAC CATH: 09/02/2022   Prox RCA to Mid RCA lesion is 85% stenosed.   Prox RCA lesion is 40% stenosed.   Prox LAD  to Mid LAD lesion is 55% stenosed.   Mid LAD lesion is 40% stenosed.   Dist LAD lesion is 40% stenosed.   Mid Cx lesion is 30% stenosed.   Dist Cx lesion is 40% stenosed.   2nd Diag-1 lesion is 30% stenosed.   2nd Diag-2 lesion is 20% stenosed.   Ost LAD to Prox LAD lesion is 20% stenosed.   The left ventricular ejection fraction is 45-50% by visual estimate.   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   Very large dominant left circumflex coronary artery with mild nonobstructive plaque in the AV groove circumflex.  The third marginal branch is diminutive.  The distal circumflex supplies four vessel supplying the posterior and inferolateral wall.  There is a suggestion of very distal diminutive vessel collateralization collateralization at the apex.   Very small nondominant RCA with diffuse 40% proximal stenosis and 85% mid stenosis.  Vessel caliber appears taller than the 5 French  (1.7 mm) diagnostic catheter.   Mild LV dysfunction with suggestion of infero-apical hypocontractility.  LVEDP 28 mmHg.   RECOMMENDATION: Initial attempt at medical therapy trial.  Plan 2D echo Doppler study today for further assessment of LV function.  Patient has been on amlodipine 5 mg and lisinopril HCT for blood pressure control; will add low-dose metoprolol tartrate and isosorbide. Aggressive lipid management with target LDL less than 70.      ECHO: 09/03/2022: 1. Left ventricular ejection fraction, by estimation, is 55%. The left  ventricle has normal function. The left ventricle has no regional wall  motion abnormalities. There is mild concentric left ventricular  hypertrophy. Left ventricular diastolic  parameters are consistent with Grade II diastolic dysfunction  (pseudonormalization).   2. Right ventricular systolic function is normal. The right ventricular  size is normal. There is normal pulmonary artery systolic pressure. The  estimated right ventricular systolic pressure is 24.7 mmHg.   3. The mitral valve is normal in structure. Trivial mitral valve  regurgitation. No evidence of mitral stenosis.   4. The aortic valve was not well visualized. Aortic valve regurgitation  is not visualized. No aortic stenosis is present.   5. The inferior vena cava is normal in size with greater than 50%  respiratory variability, suggesting right atrial pressure of 3 mmHg.      ASSESSMENT:    No diagnosis found.   PLAN:  Ms. Latecia Ippolito is a very pleasant 72 year old female with a history of hypertension, remote tobacco use, who developed an acute coronary syndrome leading to emergent cardiac catheterization on September 03, 2022.  She was found to have multivessel CAD and upon arrival to the catheterization laboratory was pain-free.  Initial medical therapy was recommended and since that time she has remained pain-free on a medical regimen consisting of DAPT with aspirin/Plavix,  amlodipine 5 mg, isosorbide 30 mg, losartan 25 mg, and metoprolol tartrate 25 mg twice a day.  LP(a) was normal.  Lipid studies in December showed LDL cholesterol at 58.  AST was normal although ALT was minimally increased at 36.  Presently, she has felt well and has been without recurrent symptoms.  Initially she was experience some mild shortness of breath which has resolved.  Her echo Doppler study revealed normal systolic function with grade 2 diastolic dysfunction.  Her blood pressure today on recheck by me was elevated.  Her resting pulse is 57.  I have suggested further titration of losartan to 50 mg daily for more optimal  blood pressure treatment with target blood pressure less than 130/80.  With her multivessel CAD I have suggested even more aggressive lipid management and with her concern of liver function abnormality I have suggested the addition of Zetia 10 mg and that she can decrease atorvastatin to 40 mg.  This should provide further LDL reduction than her 80 mg tablet.  I have recommended that follow-up laboratory be obtained in 3 to 4 months with a fasting chemistry and lipid studies.  It was recommended she continue to exercise.  I will see her in 4 to 5 months for follow-up evaluation or sooner as needed.   Medication Adjustments/Labs and Tests Ordered: Current medicines are reviewed at length with the patient today.  Concerns regarding medicines are outlined above.  Medication changes, Labs and Tests ordered today are listed in the Patient Instructions below. There are no Patient Instructions on file for this visit.   Signed, Nicki Guadalajara, MD  07/05/2023 6:23 PM    [facility] [address], [place], [place]  [address] Phone: [phone]

## 2023-07-06 ENCOUNTER — Ambulatory Visit: Payer: Medicare Other | Admitting: Cardiovascular Disease

## 2023-07-15 ENCOUNTER — Other Ambulatory Visit: Payer: Self-pay | Admitting: Family Medicine

## 2023-07-15 DIAGNOSIS — N952 Postmenopausal atrophic vaginitis: Secondary | ICD-10-CM

## 2023-07-17 DIAGNOSIS — M1711 Unilateral primary osteoarthritis, right knee: Secondary | ICD-10-CM | POA: Diagnosis not present

## 2023-08-02 ENCOUNTER — Telehealth: Payer: Self-pay | Admitting: Family Medicine

## 2023-08-02 NOTE — Telephone Encounter (Signed)
[facility]) called with the following information:  Abnormal Findings - PVD Screening - Findings were mild on both legs, pt is asymptomatic

## 2023-08-03 ENCOUNTER — Encounter: Payer: Self-pay | Admitting: Family Medicine

## 2023-09-01 NOTE — Progress Notes (Unsigned)
[facility] [address] [place], [place] [address] [phone]  Date:  09/03/2023   Name:  Jeanette Mckee   DOB:  [birth year]   MRN:  [id number]  PCP:  Pearline Cables, MD    Chief Complaint: No chief complaint on file.   History of Present Illness:  Jeanette Mckee is a 72 y.o. 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   Hyperlipidemia 02/11/2013   HTN (hypertension) 02/04/2012   Abnormal liver enzymes 02/04/2012   Chronic back pain 02/04/2012    Past Medical History:  Diagnosis Date   Headache    History of chicken pox    childhood   Hypertension    Measles as a child   Other and unspecified hyperlipidemia 02/11/2013   Preventative health care 02/11/2013    Past Surgical History:  Procedure Laterality Date   ABDOMINAL HYSTERECTOMY  12/1994   and RSO & LS   LEFT HEART CATH AND CORONARY ANGIOGRAPHY N/A 09/03/2022   Procedure: LEFT HEART CATH AND CORONARY ANGIOGRAPHY;  Surgeon: Lennette Bihari, MD;  Location: [facility] INV[facility] CV LAB;  Service: Cardiovascular;  Laterality: N/A;    Social History   Tobacco Use   Smoking status: Former    Current packs/day: 0.00    Types: Cigarettes    Start date: 11/13/1981    Quit date: 11/13/2001    Years since quitting: 21.8   Smokeless tobacco: Never   Tobacco comments:    quit in 2003 1 ppd for 20 years  Substance Use Topics   Alcohol use: Yes    Comment: WINE FRIDAY AND SATURDAY   Drug use: No    Family History  Problem Relation Age of Onset    Alcohol abuse Father    Heart disease Father    Stroke Father    Hypertension Father    Diabetes Father        type 2   Cancer Father        prostate   Heart disease Mother    Hypertension Mother    Arthritis Mother    Heart attack Mother 48   Crohn's disease Mother    Cancer Maternal Grandmother        bone- nonsmoker   Heart disease Son        MI s/p 3 stents, January 2015   Breast cancer Neg Hx    Colon cancer Neg Hx     Allergies  Allergen Reactions   Morphine Itching    During 09/03/22 hospital stay pt received Morphine via IV. She describes having a local itchy reaction that subsided shortly after administration.    Medication list has been reviewed and updated.  Current Outpatient Medications on File Prior to Visit  Medication Sig Dispense Refill   acetaminophen (TYLENOL) 500 MG tablet Take 500 mg by mouth daily as needed for mild pain or headache.     amLODipine (NORVASC) 5 MG tablet TAKE 1 TABLET BY MOUTH DAILY 100 tablet 2   aspirin 81 MG  chewable tablet Chew 1 tablet (81 mg total) by mouth daily. 90 tablet 1   atorvastatin (LIPITOR) 40 MG tablet Take 1 tablet (40 mg total) by mouth daily. 90 tablet 3   clopidogrel (PLAVIX) 75 MG tablet TAKE 1 TABLET BY MOUTH DAILY 100 tablet 2   estradiol (ESTRACE) 0.1 MG/GM vaginal cream PLACE 1 APPLICATORFUL VAGINALLY  TWICE WEEKLY 127.5 g 2   ezetimibe (ZETIA) 10 MG tablet Take 1 tablet (10 mg total) by mouth daily. 90 tablet 3   fluticasone (FLONASE) 50 MCG/ACT nasal spray Place 2 sprays into both nostrils daily. 16 g 0   furosemide (LASIX) 20 MG tablet Take 1 tablet (20 mg total) by mouth daily. 90 tablet 3   ibuprofen (ADVIL) 200 MG tablet Take 200 mg by mouth daily as needed for mild pain or headache.     isosorbide mononitrate (IMDUR) 30 MG 24 hr tablet TAKE 1 TABLET BY MOUTH DAILY 100 tablet 2   losartan (COZAAR) 50 MG tablet Take 1 tablet (50 mg total) by mouth daily. 90 tablet 3   metoprolol tartrate (LOPRESSOR) 25  MG tablet TAKE 1 TABLET BY MOUTH TWICE  DAILY 200 tablet 2   Multiple Vitamin (MULTIVITAMIN) tablet Take 1 tablet by mouth daily.     nitroGLYCERIN (NITROSTAT) 0.4 MG SL tablet Place 1 tablet (0.4 mg total) under the tongue every 5 (five) minutes as needed for chest pain. 25 tablet 2   No current facility-administered medications on file prior to visit.    Review of Systems:  As per HPI- otherwise negative.   Physical Examination: There were no vitals filed for this visit. There were no vitals filed for this visit. There is no height or weight on file to calculate BMI. Ideal Body Weight:    GEN: no acute distress. HEENT: Atraumatic, Normocephalic.  Ears and Nose: No external deformity. CV: RRR, No M/G/R. No JVD. No thrill. No extra heart sounds. PULM: CTA B, no wheezes, crackles, rhonchi. No retractions. No resp. distress. No accessory muscle use. ABD: S, NT, ND, +BS. No rebound. No HSM. EXTR: No c/c/e PSYCH: Normally interactive. Conversant.    Assessment and Plan: ***  Signed Abbe Amsterdam, MD

## 2023-09-03 ENCOUNTER — Ambulatory Visit (INDEPENDENT_AMBULATORY_CARE_PROVIDER_SITE_OTHER): Payer: Medicare Other | Admitting: Family Medicine

## 2023-09-03 ENCOUNTER — Encounter: Payer: Self-pay | Admitting: Family Medicine

## 2023-09-03 VITALS — BP 140/80 | HR 71 | Temp 97.7°F | Resp 18 | Ht 66.0 in | Wt 174.6 lb

## 2023-09-03 DIAGNOSIS — Z23 Encounter for immunization: Secondary | ICD-10-CM | POA: Diagnosis not present

## 2023-09-03 DIAGNOSIS — H6123 Impacted cerumen, bilateral: Secondary | ICD-10-CM | POA: Diagnosis not present

## 2023-09-03 NOTE — Patient Instructions (Addendum)
I think your issue was just ear wax- however please alert me if your hearing/ ear symptoms don't resolve in the next few days  Flu shot given today Recommend that you use some Debrox drop OTC to help keep your ears clear   Recommend shingrix series and covid booster at your pharmacy if not up to date!

## 2023-09-06 ENCOUNTER — Ambulatory Visit: Payer: Medicare Other | Admitting: Cardiovascular Disease

## 2023-09-17 ENCOUNTER — Telehealth: Payer: Self-pay | Admitting: Family Medicine

## 2023-09-17 NOTE — Telephone Encounter (Signed)
Copied from CRM [id number]. Topic: Medicare AWV >> Sep 17, 2023  2:18 PM Payton Doughty wrote: Reason for CRM: Called LVM 09/17/2023 to schedule Annual Wellness Visit  Verlee Rossetti; Care Guide Ambulatory Clinical Support [place] [facility] Direct Dial: [phone]

## 2023-10-04 ENCOUNTER — Other Ambulatory Visit: Payer: Self-pay | Admitting: Cardiovascular Disease

## 2023-10-04 ENCOUNTER — Ambulatory Visit: Payer: Medicare Other

## 2023-10-04 VITALS — Ht 66.0 in | Wt 169.0 lb

## 2023-10-04 DIAGNOSIS — Z0000 Encounter for general adult medical examination without abnormal findings: Secondary | ICD-10-CM | POA: Diagnosis not present

## 2023-10-04 NOTE — Patient Instructions (Addendum)
Jeanette Mckee , Thank you for taking time to come for your Medicare Wellness Visit. I appreciate your ongoing commitment to your health goals. Please review the following plan we discussed and let me know if I can assist you in the future.   Referrals/Orders/Follow-Ups/Clinician Recommendations:   This is a list of the screening recommended for you and due dates:  Health Maintenance  Topic Date Due   Zoster (Shingles) Vaccine (1 of 2) Never done   COVID-19 Vaccine (3 - Moderna risk series) 02/21/2020   Medicare Annual Wellness Visit  10/03/2024   Mammogram  05/06/2025   Cologuard (Stool DNA test)  07/25/2025   DTaP/Tdap/Td vaccine (2 - Td or Tdap) 10/02/2028   Pneumonia Vaccine  Completed   Flu Shot  Completed   DEXA scan (bone density measurement)  Completed   Hepatitis C Screening  Addressed   HPV Vaccine  Aged Out    Advanced directives: (Copy Requested) Please bring a copy of your health care power of attorney and living will to the office to be added to your chart at your convenience.  Next Medicare Annual Wellness Visit scheduled for next year: Yes

## 2023-10-04 NOTE — Progress Notes (Signed)
Subjective:   Jeanette Mckee is a 72 y.o. female who presents for Medicare Annual (Subsequent) preventive examination.  Visit Complete: Virtual I connected with  Jeanette Mckee on 10/04/23 by a audio enabled telemedicine application and verified that I am speaking with the correct person using two identifiers.  Patient Location: Home  Provider Location: Home Office  I discussed the limitations of evaluation and management by telemedicine. The patient expressed understanding and agreed to proceed.  Vital Signs: Because this visit was a virtual/telehealth visit, some criteria may be missing or patient reported. Any vitals not documented were not able to be obtained and vitals that have been documented are patient reported.  Patient Medicare AWV questionnaire was completed by the patient on 10/02/23; I have confirmed that all information answered by patient is correct and no changes since this date.  Cardiac Risk Factors include: advanced age (>44men, >103 women);hypertension     Objective:    Today's Vitals   10/04/23 1513  Weight: 169 lb (76.7 kg)  Height: 5\' 6"  (1.676 m)   Body mass index is 27.28 kg/m.     10/04/2023    3:21 PM 09/25/2022    8:23 AM 09/22/2021    8:26 AM 08/10/2021   10:04 AM 08/08/2021    9:51 AM 04/27/2020    8:00 AM 01/19/2020   11:09 AM  Advanced Directives  Does Patient Have a Medical Advance Directive? Yes No No No No No No  Type of Estate agent of [place];Living will        Copy of Healthcare Power of Attorney in Chart? No - copy requested        Would patient like information on creating a medical advance directive?  No - Patient declined No - Patient declined No - Patient declined Yes (MAU/Ambulatory/Procedural Areas - Information given) Yes (MAU/Ambulatory/Procedural Areas - Information given) No - Patient declined    Current Medications (verified) Outpatient Encounter Medications as of 10/04/2023  Medication Sig    acetaminophen (TYLENOL) 500 MG tablet Take 500 mg by mouth daily as needed for mild pain or headache.   amLODipine (NORVASC) 5 MG tablet TAKE 1 TABLET BY MOUTH DAILY   aspirin 81 MG chewable tablet Chew 1 tablet (81 mg total) by mouth daily.   estradiol (ESTRACE) 0.1 MG/GM vaginal cream PLACE 1 APPLICATORFUL VAGINALLY  TWICE WEEKLY   fluticasone (FLONASE) 50 MCG/ACT nasal spray Place 2 sprays into both nostrils daily.   ibuprofen (ADVIL) 200 MG tablet Take 200 mg by mouth daily as needed for mild pain or headache.   isosorbide mononitrate (IMDUR) 30 MG 24 hr tablet TAKE 1 TABLET BY MOUTH DAILY   Multiple Vitamin (MULTIVITAMIN) tablet Take 1 tablet by mouth daily.   nitroGLYCERIN (NITROSTAT) 0.4 MG SL tablet Place 1 tablet (0.4 mg total) under the tongue every 5 (five) minutes as needed for chest pain.   [DISCONTINUED] atorvastatin (LIPITOR) 40 MG tablet Take 1 tablet (40 mg total) by mouth daily.   [DISCONTINUED] clopidogrel (PLAVIX) 75 MG tablet TAKE 1 TABLET BY MOUTH DAILY   [DISCONTINUED] ezetimibe (ZETIA) 10 MG tablet Take 1 tablet (10 mg total) by mouth daily.   [DISCONTINUED] furosemide (LASIX) 20 MG tablet Take 1 tablet (20 mg total) by mouth daily.   [DISCONTINUED] losartan (COZAAR) 50 MG tablet Take 1 tablet (50 mg total) by mouth daily.   [DISCONTINUED] metoprolol tartrate (LOPRESSOR) 25 MG tablet TAKE 1 TABLET BY MOUTH TWICE  DAILY   No facility-administered encounter  medications on file as of 10/04/2023.    Allergies (verified) Morphine   History: Past Medical History:  Diagnosis Date   Headache    History of chicken pox    childhood   Hypertension    Measles as a child   Other and unspecified hyperlipidemia 02/11/2013   Preventative health care 02/11/2013   Past Surgical History:  Procedure Laterality Date   ABDOMINAL HYSTERECTOMY  12/1994   and RSO & LS   LEFT HEART CATH AND CORONARY ANGIOGRAPHY N/A 09/03/2022   Procedure: LEFT HEART CATH AND CORONARY ANGIOGRAPHY;   Surgeon: Lennette Bihari, MD;  Location: [facility];  Service: Cardiovascular;  Laterality: N/A;   Family History  Problem Relation Age of Onset   Alcohol abuse Father    Heart disease Father    Stroke Father    Hypertension Father    Diabetes Father        type 2   Cancer Father        prostate   Heart disease Mother    Hypertension Mother    Arthritis Mother    Heart attack Mother 55   Crohn's disease Mother    Cancer Maternal Grandmother        bone- nonsmoker   Heart disease Son        MI s/p 3 stents, January 2015   Breast cancer Neg Hx    Colon cancer Neg Hx    Social History   Socioeconomic History   Marital status: Married    Spouse name: Not on file   Number of children: Not on file   Years of education: Not on file   Highest education level: Not on file  Occupational History   Not on file  Tobacco Use   Smoking status: Former    Current packs/day: 0.00    Types: Cigarettes    Start date: 11/13/1981    Quit date: 11/13/2001    Years since quitting: 21.9   Smokeless tobacco: Never   Tobacco comments:    quit in 2003 1 ppd for 20 years  Vaping Use   Vaping status: Not on file  Substance and Sexual Activity   Alcohol use: Yes    Comment: WINE FRIDAY AND SATURDAY   Drug use: No   Sexual activity: Yes    Comment: lives with husband, retired from church, [place]. eats a heart healthy diet, minimal meats.exercises 6 days a week  Other Topics Concern   Not on file  Social History Narrative   Not on file   Social Determinants of Health   Financial Resource Strain: Low Risk  (10/02/2023)   Overall Financial Resource Strain (CARDIA)    Difficulty of Paying Living Expenses: Not hard at all  Food Insecurity: No Food Insecurity (10/02/2023)   Hunger Vital Sign    Worried About Running Out of Food in the Last Year: Never true    Ran Out of Food in the Last Year: Never true  Transportation Needs: No Transportation Needs (10/02/2023)   PRAPARE -  Administrator, Civil Service (Medical): No    Lack of Transportation (Non-Medical): No  Physical Activity: Sufficiently Active (10/02/2023)   Exercise Vital Sign    Days of Exercise per Week: 3 days    Minutes of Exercise per Session: 50 min  Stress: No Stress Concern Present (10/02/2023)   [employer] of Occupational Health - Occupational Stress Questionnaire    Feeling of Stress : Only a little  Social Connections: Unknown (10/02/2023)   Social Connection and Isolation Panel [NHANES]    Frequency of Communication with Friends and Family: More than three times a week    Frequency of Social Gatherings with Friends and Family: More than three times a week    Attends Religious Services: Not on Marketing executive or Organizations: Yes    Attends Engineer, structural: More than 4 times per year    Marital Status: Married    Tobacco Counseling Counseling given: Not Answered Tobacco comments: quit in 2003 1 ppd for 20 years   Clinical Intake:  Pre-visit preparation completed: Yes  Pain : No/denies pain     BMI - recorded: 27.28 Nutritional Status: BMI 25 -29 Overweight Nutritional Risks: None Diabetes: No  How often do you need to have someone help you when you read instructions, pamphlets, or other written materials from your doctor or pharmacy?: 1 - Never  Interpreter Needed?: No  Information entered by :: Theresa Mulligan LPN   Activities of Daily Living    10/02/2023   12:09 PM  In your present state of health, do you have any difficulty performing the following activities:  Hearing? 0  Vision? 0  Difficulty concentrating or making decisions? 0  Walking or climbing stairs? 0  Dressing or bathing? 0  Doing errands, shopping? 0  Preparing Food and eating ? N  Using the Toilet? N  In the past six months, have you accidently leaked urine? N  Do you have problems with loss of bowel control? N  Managing your Medications? N   Managing your Finances? N  Housekeeping or managing your Housekeeping? N    Patient Care Team: Copland, Gwenlyn Found, MD as PCP - General (Family Medicine) Lennette Bihari, MD as PCP - Cardiology (Cardiology) Charna Elizabeth, MD as Consulting Physician (Gastroenterology)  Indicate any recent Medical Services you may have received from other than [facility] providers in the past year (date may be approximate).     Assessment:   This is a routine wellness examination for [place].  Hearing/Vision screen Hearing Screening - Comments:: Denies hearing difficulties   Vision Screening - Comments:: Wears rx glasses - up to date with routine eye exams with  [facility]   Goals Addressed               This Visit's Progress     Patient Stated (pt-stated)        Lose some more weight & increase activity!       Depression Screen    10/04/2023    3:19 PM 06/06/2023    1:26 PM 09/25/2022    8:24 AM 03/16/2022    8:17 AM 09/22/2021    8:31 AM 01/19/2020   11:14 AM 09/12/2017    3:19 PM  PHQ 2/9 Scores  PHQ - 2 Score 0 0 0 0 0 0 0    Fall Risk    10/04/2023    3:21 PM 10/02/2023   12:09 PM 06/06/2023    1:26 PM 09/25/2022    8:24 AM 03/16/2022    8:19 AM  Fall Risk   Falls in the past year? 0 0 0 0 1  Number falls in past yr: 0 0 0 0 1  Injury with Fall? 0 0 0 0 0  Risk for fall due to : No Fall Risks  No Fall Risks No Fall Risks   Follow up Falls prevention discussed  Falls evaluation completed  Falls evaluation completed     MEDICARE RISK AT HOME: Medicare Risk at Home Any stairs in or around the home?: No If so, are there any without handrails?: No Home free of loose throw rugs in walkways, pet beds, electrical cords, etc?: Yes Adequate lighting in your home to reduce risk of falls?: Yes Life alert?: No Use of a cane, walker or w/c?: No Grab bars in the bathroom?: Yes Shower chair or bench in shower?: Yes Elevated toilet seat or a handicapped toilet?: Yes  TIMED UP AND  GO:  Was the test performed?  No    Cognitive Function:    09/12/2017    3:20 PM  MMSE - Mini Mental State Exam  Orientation to time 5  Orientation to Place 5  Registration 3  Attention/ Calculation 5  Recall 3  Language- name 2 objects 2  Language- repeat 1  Language- follow 3 step command 3  Language- read & follow direction 1  Write a sentence 1  Copy design 1  Total score 30        10/04/2023    3:22 PM 09/25/2022    8:28 AM  6CIT Screen  What Year? 0 points 0 points  What month? 0 points 0 points  What time? 0 points 0 points  Count back from 20 0 points 0 points  Months in reverse 0 points 0 points  Repeat phrase 0 points 0 points  Total Score 0 points 0 points    Immunizations Immunization History  Administered Date(s) Administered   Fluad Quad(high Dose 65+) 10/08/2019   Fluad Trivalent(High Dose 65+) 09/03/2023   Influenza, High Dose Seasonal PF 07/20/2016, 09/05/2017, 10/02/2018   Moderna Sars-Covid-2 Vaccination 12/27/2019, 01/24/2020   Pneumococcal Conjugate-13 02/03/2016   Pneumococcal Polysaccharide-23 09/05/2017   Tdap 10/02/2018    TDAP status: Up to date  Flu Vaccine status: Up to date  Pneumococcal vaccine status: Up to date  Covid-19 vaccine status: Declined, Education has been provided regarding the importance of this vaccine but patient still declined. Advised may receive this vaccine at local pharmacy or Health Dept.or vaccine clinic. Aware to provide a copy of the vaccination record if obtained from local pharmacy or Health Dept. Verbalized acceptance and understanding.  Qualifies for Shingles Vaccine? Yes   Zostavax completed No   Shingrix Completed?: No.    Education has been provided regarding the importance of this vaccine. Patient has been advised to call insurance company to determine out of pocket expense if they have not yet received this vaccine. Advised may also receive vaccine at local pharmacy or Health Dept. Verbalized  acceptance and understanding.  Screening Tests Health Maintenance  Topic Date Due   Zoster Vaccines- Shingrix (1 of 2) Never done   COVID-19 Vaccine (3 - Moderna risk series) 02/21/2020   Medicare Annual Wellness (AWV)  10/03/2024   MAMMOGRAM  05/06/2025   Fecal DNA (Cologuard)  07/25/2025   DTaP/Tdap/Td (2 - Td or Tdap) 10/02/2028   Pneumonia Vaccine 28+ Years old  Completed   INFLUENZA VACCINE  Completed   DEXA SCAN  Completed   Hepatitis C Screening  Addressed   HPV VACCINES  Aged Out    Health Maintenance  Health Maintenance Due  Topic Date Due   Zoster Vaccines- Shingrix (1 of 2) Never done   COVID-19 Vaccine (3 - Moderna risk series) 02/21/2020    Colorectal cancer screening: Type of screening: Cologuard. Completed 07/25/22. Repeat every 3 years  Mammogram status: Completed 05/07/23. Repeat every year  Bone Density status: Completed 03/27/22. Results reflect: Bone density results: OSTEOPENIA. Repeat every   years.    Additional Screening:  Hepatitis C Screening: does qualify; Completed 11/13/05  Vision Screening: Recommended annual ophthalmology exams for early detection of glaucoma and other disorders of the eye. Is the patient up to date with their annual eye exam?  Yes  Who is the provider or what is the name of the office in which the patient attends annual eye exams? [facility] If pt is not established with a provider, would they like to be referred to a provider to establish care? No .   Dental Screening: Recommended annual dental exams for proper oral hygiene   Community Resource Referral / Chronic Care Management:  CRR required this visit?  No   CCM required this visit?  No     Plan:     I have personally reviewed and noted the following in the patient's chart:   Medical and social history Use of alcohol, tobacco or illicit drugs  Current medications and supplements including opioid prescriptions. Patient is not currently taking opioid  prescriptions. Functional ability and status Nutritional status Physical activity Advanced directives List of other physicians Hospitalizations, surgeries, and ER visits in previous 12 months Vitals Screenings to include cognitive, depression, and falls Referrals and appointments  In addition, I have reviewed and discussed with patient certain preventive protocols, quality metrics, and best practice recommendations. A written personalized care plan for preventive services as well as general preventive health recommendations were provided to patient.     Tillie Rung, LPN   [id number]/08/[id number]   After Visit Summary: (MyChart) Due to this being a telephonic visit, the after visit summary with patients personalized plan was offered to patient via MyChart   Nurse Notes: None

## 2023-10-05 ENCOUNTER — Telehealth: Payer: Medicare Other | Admitting: Physician Assistant

## 2023-10-05 DIAGNOSIS — R3989 Other symptoms and signs involving the genitourinary system: Secondary | ICD-10-CM

## 2023-10-05 MED ORDER — SULFAMETHOXAZOLE-TRIMETHOPRIM 800-160 MG PO TABS
1.0000 | ORAL_TABLET | Freq: Two times a day (BID) | ORAL | 0 refills | Status: DC
Start: 2023-10-05 — End: 2024-09-24

## 2023-10-05 NOTE — Progress Notes (Signed)
E-Visit for Urinary Problems  We are sorry that you are not feeling well.  Here is how we plan to help!  Based on what you shared with me it looks like you most likely have a simple urinary tract infection.  A UTI (Urinary Tract Infection) is a bacterial infection of the bladder.  Most cases of urinary tract infections are simple to treat but a key part of your care is to encourage you to drink plenty of fluids and watch your symptoms carefully.  I have prescribed Bactrim DS One tablet twice a day for 5 days.  Your symptoms should gradually improve. Call us if the burning in your urine worsens, you develop worsening fever, back pain or pelvic pain or if your symptoms do not resolve after completing the antibiotic.  Urinary tract infections can be prevented by drinking plenty of water to keep your body hydrated.  Also be sure when you wipe, wipe from front to back and don't hold it in!  If possible, empty your bladder every 4 hours.  HOME CARE Drink plenty of fluids Compete the full course of the antibiotics even if the symptoms resolve Remember, when you need to go.go. Holding in your urine can increase the likelihood of getting a UTI! GET HELP RIGHT AWAY IF: You cannot urinate You get a high fever Worsening back pain occurs You see blood in your urine You feel sick to your stomach or throw up You feel like you are going to pass out  MAKE SURE YOU  Understand these instructions. Will watch your condition. Will get help right away if you are not doing well or get worse.   Thank you for choosing an e-visit.  Your e-visit answers were reviewed by a board certified advanced clinical practitioner to complete your personal care plan. Depending upon the condition, your plan could have included both over the counter or prescription medications.  Please review your pharmacy choice. Make sure the pharmacy is open so you can pick up prescription now. If there is a problem, you may contact  your provider through [employer] and have the prescription routed to another pharmacy.  Your safety is important to [place]. If you have drug allergies check your prescription carefully.   For the next 24 hours you can use MyChart to ask questions about today's visit, request a non-urgent call back, or ask for a work or school excuse. You will get an email in the next two days asking about your experience. I hope that your e-visit has been valuable and will speed your recovery.   I have spent 5 minutes in review of e-visit questionnaire, review and updating patient chart, medical decision making and response to patient.   Margaretann Loveless, PA-C

## 2023-10-08 ENCOUNTER — Other Ambulatory Visit: Payer: Self-pay | Admitting: Cardiovascular Disease

## 2023-11-15 ENCOUNTER — Other Ambulatory Visit: Payer: Self-pay | Admitting: Nurse Practitioner

## 2023-11-16 NOTE — Telephone Encounter (Signed)
 Patient's pharmacy is requesting Furosemide 20mg .  Medication was discontinued in November 2024. Please verify if okay to refill. Thank you.

## 2023-12-26 ENCOUNTER — Ambulatory Visit: Payer: Medicare Other | Admitting: Cardiovascular Disease

## 2024-02-02 ENCOUNTER — Other Ambulatory Visit: Payer: Self-pay | Admitting: Cardiovascular Disease

## 2024-02-02 DIAGNOSIS — I10 Essential (primary) hypertension: Secondary | ICD-10-CM

## 2024-02-21 ENCOUNTER — Other Ambulatory Visit: Payer: Self-pay | Admitting: Cardiovascular Disease

## 2024-02-21 DIAGNOSIS — I10 Essential (primary) hypertension: Secondary | ICD-10-CM

## 2024-02-28 ENCOUNTER — Other Ambulatory Visit: Payer: Self-pay | Admitting: Cardiovascular Disease

## 2024-02-28 DIAGNOSIS — I10 Essential (primary) hypertension: Secondary | ICD-10-CM

## 2024-02-29 ENCOUNTER — Encounter: Payer: Self-pay | Admitting: Family Medicine

## 2024-02-29 DIAGNOSIS — I10 Essential (primary) hypertension: Secondary | ICD-10-CM

## 2024-02-29 MED ORDER — AMLODIPINE BESYLATE 5 MG PO TABS: 5.0000 mg | ORAL_TABLET | Freq: Every day | ORAL | 3 refills | Status: DC

## 2024-03-13 ENCOUNTER — Other Ambulatory Visit: Payer: Self-pay | Admitting: Cardiovascular Disease

## 2024-04-17 ENCOUNTER — Telehealth (HOSPITAL_BASED_OUTPATIENT_CLINIC_OR_DEPARTMENT_OTHER): Payer: Self-pay

## 2024-04-17 ENCOUNTER — Other Ambulatory Visit (HOSPITAL_BASED_OUTPATIENT_CLINIC_OR_DEPARTMENT_OTHER): Payer: Self-pay | Admitting: Family Medicine

## 2024-04-17 DIAGNOSIS — Z1231 Encounter for screening mammogram for malignant neoplasm of breast: Secondary | ICD-10-CM

## 2024-05-12 ENCOUNTER — Encounter (HOSPITAL_BASED_OUTPATIENT_CLINIC_OR_DEPARTMENT_OTHER): Payer: Self-pay

## 2024-05-12 ENCOUNTER — Ambulatory Visit (HOSPITAL_BASED_OUTPATIENT_CLINIC_OR_DEPARTMENT_OTHER)
Admission: RE | Admit: 2024-05-12 | Discharge: 2024-05-12 | Disposition: A | Discharge status: Home / Self Care | Source: Ambulatory Visit | Attending: Family Medicine | Admitting: Family Medicine

## 2024-05-12 DIAGNOSIS — Z1231 Encounter for screening mammogram for malignant neoplasm of breast: Secondary | ICD-10-CM | POA: Insufficient documentation

## 2024-06-23 DIAGNOSIS — H40023 Open angle with borderline findings, high risk, bilateral: Secondary | ICD-10-CM | POA: Diagnosis not present

## 2024-06-23 DIAGNOSIS — H35363 Drusen (degenerative) of macula, bilateral: Secondary | ICD-10-CM | POA: Diagnosis not present

## 2024-06-23 DIAGNOSIS — H25813 Combined forms of age-related cataract, bilateral: Secondary | ICD-10-CM | POA: Diagnosis not present

## 2024-07-21 DIAGNOSIS — H35363 Drusen (degenerative) of macula, bilateral: Secondary | ICD-10-CM | POA: Diagnosis not present

## 2024-07-21 DIAGNOSIS — H25813 Combined forms of age-related cataract, bilateral: Secondary | ICD-10-CM | POA: Diagnosis not present

## 2024-07-21 DIAGNOSIS — H40023 Open angle with borderline findings, high risk, bilateral: Secondary | ICD-10-CM | POA: Diagnosis not present

## 2024-07-22 ENCOUNTER — Other Ambulatory Visit: Payer: Self-pay

## 2024-07-24 ENCOUNTER — Other Ambulatory Visit: Payer: Self-pay

## 2024-07-25 ENCOUNTER — Other Ambulatory Visit: Payer: Self-pay | Admitting: Family Medicine

## 2024-07-25 MED ORDER — ISOSORBIDE MONONITRATE ER 30 MG PO TB24: 30.0000 mg | ORAL_TABLET | Freq: Every day | ORAL | 0 refills | Status: DC

## 2024-07-25 NOTE — Telephone Encounter (Signed)
 Copied from CRM [id number]. Topic: Clinical - Medication Refill >> Jul 25, 2024  7:59 AM Macario HERO wrote: Medication: isosorbide  mononitrate (IMDUR ) 30 MG 24 hr tablet [[id number]]  Has the patient contacted their pharmacy? No (Agent: If no, request that the patient contact the pharmacy for the refill. If patient does not wish to contact the pharmacy document the reason why and proceed with request.) (Agent: If yes, when and what did the pharmacy advise?)  This is the patient's preferred pharmacy:   [facility] - [place], [place] - [address] [place] [address] Phone: [phone] Fax: [phone]  Is this the correct pharmacy for this prescription? Yes If no, delete pharmacy and type the correct one.   Has the prescription been filled recently? Yes  Is the patient out of the medication? No  Has the patient been seen for an appointment in the last year OR does the patient have an upcoming appointment? Yes  Can we respond through MyChart? Yes  Agent: Please be advised that Rx refills may take up to 3 business days. We ask that you follow-up with your pharmacy.

## 2024-08-08 ENCOUNTER — Other Ambulatory Visit: Payer: Self-pay | Admitting: Nurse Practitioner

## 2024-08-19 ENCOUNTER — Encounter: Payer: Self-pay | Admitting: Family Medicine

## 2024-08-19 MED ORDER — ISOSORBIDE MONONITRATE ER 30 MG PO TB24: 30.0000 mg | ORAL_TABLET | Freq: Every day | ORAL | 3 refills | Status: AC

## 2024-09-13 HISTORY — PX: CATARACT EXTRACTION: SUR2

## 2024-09-16 ENCOUNTER — Other Ambulatory Visit: Payer: Self-pay | Admitting: Family Medicine

## 2024-09-16 DIAGNOSIS — N952 Postmenopausal atrophic vaginitis: Secondary | ICD-10-CM

## 2024-09-21 NOTE — Progress Notes (Signed)
 [facility] [address] [place], [place] [address] [phone]  Date:  09/24/2024   Name:  Jeanette Mckee   DOB:  Apr 24, 1951   MRN:  [id number]  PCP:  Watt Harlene BROCKS, MD    Chief Complaint: No chief complaint on file.   History of Present Illness:  Jeanette Mckee is a 73 y.o. very pleasant female patient who presents with the following:  Patient seen today for physical exam.  I last saw her about 1 year ago- she has history of NSTEMI, HTN, hyperlipidemia  She is followed by cardiology, Dr. Wilhemenia visit was I believe in March 2024.  At that time she was doing well, she suffered ACS and had an urgent catheterization in October 2023.  Multivessel CAD, she is being treated medically  COVID booster Flu shot Shingles RSV Cologuard 9/23 Mammogram completed last summer Bone density can be updated  Amlodipine  5 Lipitor 40 Imdur  30 Furosemide  20 mg daily Aspirin  81 Vaginal estrogen cream She is status post hysterectomy  Discussed the use of AI scribe software for clinical note transcription with the patient, who gave verbal consent to proceed.  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   Hyperlipidemia 02/11/2013   HTN (hypertension) 02/04/2012    Past Medical History:  Diagnosis Date   Headache    History of chicken pox    childhood   Hypertension    Measles as a child   Other and unspecified hyperlipidemia 02/11/2013   Preventative health care 02/11/2013    Past Surgical History:  Procedure Laterality Date   ABDOMINAL HYSTERECTOMY  12/1994   and RSO & LS   LEFT HEART CATH AND CORONARY ANGIOGRAPHY N/A 09/03/2022   Procedure: LEFT HEART CATH AND CORONARY ANGIOGRAPHY;  Surgeon: Burnard Debby DELENA, MD;  [facility]: [facility];  Service: Cardiovascular;  Laterality: N/A;    Social History   Tobacco Use   Smoking status: Former    Current packs/day: 0.00    Types: Cigarettes    Start date: 11/13/1981    Quit date: 11/13/2001    Years since quitting: 22.8   Smokeless tobacco: Never   Tobacco comments:    quit in 2003 1 ppd for 20 years  Substance Use Topics   Alcohol use: Yes    Comment: WINE FRIDAY AND SATURDAY   Drug use: No    Family History  Problem Relation Age of Onset   Alcohol abuse Father    Heart disease Father    Stroke Father    Hypertension Father    Diabetes Father        type 2   Cancer Father        prostate   Heart disease Mother    Hypertension Mother    Arthritis Mother    Heart attack Mother 75   Crohn's disease Mother    Cancer Maternal Grandmother        bone- nonsmoker   Heart disease Son        MI s/p 3 stents, January 2015   Breast cancer Neg Hx    Colon cancer Neg Hx     Allergies  Allergen Reactions   Morphine  Itching    During 09/03/22  hospital stay pt received Morphine  via IV. She describes having a local itchy reaction that subsided shortly after administration.    Medication list has been reviewed and updated.  Current Outpatient Medications on File Prior to Visit  Medication Sig Dispense Refill   acetaminophen  (TYLENOL ) 500 MG tablet Take 500 mg by mouth daily as needed for mild pain or headache.     amLODipine  (NORVASC ) 5 MG tablet Take 1 tablet (5 mg total) by mouth daily. 90 tablet 3   aspirin  81 MG chewable tablet Chew 1 tablet (81 mg total) by mouth daily. 90 tablet 1   atorvastatin  (LIPITOR) 40 MG tablet TAKE 1 TABLET BY MOUTH DAILY 90 tablet 0   estradiol  (ESTRACE ) 0.01 % CREA vaginal cream PLACE 1 APPLICATORFUL VAGINALLY  TWICE WEEKLY 127.5 g 2   fluticasone  (FLONASE ) 50 MCG/ACT nasal spray Place 2 sprays into both nostrils daily. 16 g 0   furosemide  (LASIX ) 20 MG tablet Take 1 tablet by mouth once daily 90  tablet 3   ibuprofen (ADVIL) 200 MG tablet Take 200 mg by mouth daily as needed for mild pain or headache.     isosorbide  mononitrate (IMDUR ) 30 MG 24 hr tablet Take 1 tablet (30 mg total) by mouth daily. 90 tablet 3   Multiple Vitamin (MULTIVITAMIN) tablet Take 1 tablet by mouth daily.     nitroGLYCERIN  (NITROSTAT ) 0.4 MG SL tablet Place 1 tablet (0.4 mg total) under the tongue every 5 (five) minutes as needed for chest pain. 25 tablet 2   sulfamethoxazole -trimethoprim  (BACTRIM  DS) 800-160 MG tablet Take 1 tablet by mouth 2 (two) times daily. 10 tablet 0   No current facility-administered medications on file prior to visit.    Review of Systems:  As per HPI- otherwise negative.   Physical Examination: There were no vitals filed for this visit. There were no vitals filed for this visit. There is no height or weight on file to calculate BMI. Ideal Body Weight:    GEN: no acute distress. HEENT: Atraumatic, Normocephalic.  Ears and Nose: No external deformity. CV: RRR, No M/G/R. No JVD. No thrill. No extra heart sounds. PULM: CTA B, no wheezes, crackles, rhonchi. No retractions. No resp. distress. No accessory muscle use. ABD: S, NT, ND, +BS. No rebound. No HSM. EXTR: No c/c/e PSYCH: Normally interactive. Conversant.    Assessment and Plan: No diagnosis found.  Assessment & Plan   Signed Harlene Schroeder, MD

## 2024-09-21 NOTE — Patient Instructions (Signed)
 It was great to see you today, I will be in touch with your lab work Recommend COVID booster this fall, shingles vaccine series/Shingrix, 1 dose of RSV Flu shot given today I placed a referral for you to establish with Dr Raford if she is taking new patients!  Let's have you try crestor 10 mg for your cholesterol- ok to start with every other day first and see how this goes!  Let me know how you tolerate it.  If you do well increase to daily use Please keep me posted

## 2024-09-24 ENCOUNTER — Ambulatory Visit: Admitting: Family Medicine

## 2024-09-24 ENCOUNTER — Encounter: Payer: Self-pay | Admitting: Family Medicine

## 2024-09-24 VITALS — BP 138/78 | HR 77 | Temp 98.1°F | Ht 66.0 in | Wt 174.6 lb

## 2024-09-24 DIAGNOSIS — Z1329 Encounter for screening for other suspected endocrine disorder: Secondary | ICD-10-CM | POA: Diagnosis not present

## 2024-09-24 DIAGNOSIS — Z23 Encounter for immunization: Secondary | ICD-10-CM | POA: Diagnosis not present

## 2024-09-24 DIAGNOSIS — Z131 Encounter for screening for diabetes mellitus: Secondary | ICD-10-CM | POA: Diagnosis not present

## 2024-09-24 DIAGNOSIS — Z0000 Encounter for general adult medical examination without abnormal findings: Secondary | ICD-10-CM

## 2024-09-24 DIAGNOSIS — I10 Essential (primary) hypertension: Secondary | ICD-10-CM

## 2024-09-24 DIAGNOSIS — I2510 Atherosclerotic heart disease of native coronary artery without angina pectoris: Secondary | ICD-10-CM

## 2024-09-24 DIAGNOSIS — E782 Mixed hyperlipidemia: Secondary | ICD-10-CM

## 2024-09-24 DIAGNOSIS — E2839 Other primary ovarian failure: Secondary | ICD-10-CM

## 2024-09-24 LAB — COMPREHENSIVE METABOLIC PANEL WITH GFR
ALT: 15 U/L (ref 0–35)
AST: 18 U/L (ref 0–37)
Albumin: 4.3 g/dL (ref 3.5–5.2)
Alkaline Phosphatase: 108 U/L (ref 39–117)
BUN: 12 mg/dL (ref 6–23)
CO2: 29 meq/L (ref 19–32)
Calcium: 9.3 mg/dL (ref 8.4–10.5)
Chloride: 101 meq/L (ref 96–112)
Creatinine, Ser: 0.56 mg/dL (ref 0.40–1.20)
GFR: 89.48 mL/min (ref >60.00)
Glucose, Bld: 93 mg/dL (ref 70–99)
Potassium: 4.8 meq/L (ref 3.5–5.1)
Sodium: 138 meq/L (ref 135–145)
Total Bilirubin: 0.7 mg/dL (ref 0.2–1.2)
Total Protein: 7.4 g/dL (ref 6.0–8.3)

## 2024-09-24 LAB — LIPID PANEL
Cholesterol: 208 mg/dL — ABNORMAL HIGH (ref 0–200)
HDL: 84.9 mg/dL (ref >39.00)
LDL Cholesterol: 114 mg/dL — ABNORMAL HIGH (ref 0–99)
NonHDL: 122.95
Total CHOL/HDL Ratio: 2
Triglycerides: 46 mg/dL (ref 0.0–149.0)
VLDL: 9.2 mg/dL (ref 0.0–40.0)

## 2024-09-24 LAB — CBC
HCT: 42.2 % (ref 36.0–46.0)
Hemoglobin: 14.3 g/dL (ref 12.0–15.0)
MCHC: 34 g/dL (ref 30.0–36.0)
MCV: 88 fl (ref 78.0–100.0)
Platelets: 264 K/uL (ref 150.0–400.0)
RBC: 4.79 Mil/uL (ref 3.87–5.11)
RDW: 13.8 % (ref 11.5–15.5)
WBC: 5.9 K/uL (ref 4.0–10.5)

## 2024-09-24 LAB — HEMOGLOBIN A1C: Hgb A1c MFr Bld: 5.7 % (ref 4.6–6.5)

## 2024-09-24 LAB — TSH: TSH: 1.03 u[IU]/mL (ref 0.35–5.50)

## 2024-09-24 MED ORDER — ROSUVASTATIN CALCIUM 10 MG PO TABS: 10.0000 mg | ORAL_TABLET | Freq: Every day | ORAL | 3 refills | Status: AC

## 2024-11-19 ENCOUNTER — Ambulatory Visit

## 2024-11-21 ENCOUNTER — Ambulatory Visit (INDEPENDENT_AMBULATORY_CARE_PROVIDER_SITE_OTHER): Admitting: *Deleted

## 2024-11-21 ENCOUNTER — Telehealth: Payer: Self-pay | Admitting: *Deleted

## 2024-11-21 VITALS — Ht 66.0 in | Wt 174.0 lb

## 2024-11-21 DIAGNOSIS — Z0000 Encounter for general adult medical examination without abnormal findings: Secondary | ICD-10-CM | POA: Diagnosis not present

## 2024-11-21 DIAGNOSIS — Z1231 Encounter for screening mammogram for malignant neoplasm of breast: Secondary | ICD-10-CM

## 2024-11-21 NOTE — Progress Notes (Signed)
 "  Please attest this visit in the absence of patient primary care provider.   Chief Complaint  Patient presents with   Medicare Wellness     Subjective:   Jeanette Mckee is a 74 y.o. female who presents for a Medicare Annual Wellness Visit.  Visit info / Clinical Intake: Medicare Wellness Visit Type:: Subsequent Annual Wellness Visit Persons participating in visit and providing information:: patient Medicare Wellness Visit Mode:: Telephone If telephone:: video declined Since this visit was completed virtually, some vitals may be partially provided or unavailable. Missing vitals are due to the limitations of the virtual format.: Unable to obtain vitals - no equipment If Telephone or Video please confirm:: I connected with patient using audio/video enable telemedicine. I verified patient identity with two identifiers, discussed telehealth limitations, and patient agreed to proceed. Patient Location:: home Provider Location:: office Interpreter Needed?: No Pre-visit prep was completed: yes AWV questionnaire completed by patient prior to visit?: no Living arrangements:: lives with spouse/significant other Patient's Overall Health Status Rating: very good Typical amount of pain: some Does pain affect daily life?: no Are you currently prescribed opioids?: no  Dietary Habits and Nutritional Risks How many meals a day?: 3 Eats fruit and vegetables daily?: yes Most meals are obtained by: preparing own meals In the last 2 weeks, have you had any of the following?: none Diabetic:: no  Functional Status Activities of Daily Living (to include ambulation/medication): Independent Ambulation: Independent Medication Administration: Independent Home Management (perform basic housework or laundry): Independent Manage your own finances?: yes Primary transportation is: driving Concerns about vision?: no *vision screening is required for WTM* (up to date with Alm Makos) Concerns about  hearing?: (!) yes (Has to ask people to repeat themselves. Thinking about getting tested.) Uses hearing aids?: no  Fall Screening Falls in the past year?: 0 Number of falls in past year: 0 Was there an injury with Fall?: 0 Fall Risk Category Calculator: 0 Patient Fall Risk Level: Low Fall Risk  Fall Risk Patient at Risk for Falls Due to: No Fall Risks Fall risk Follow up: Falls evaluation completed  Home and Transportation Safety: All rugs have non-skid backing?: yes All stairs or steps have railings?: N/A, no stairs Grab bars in the bathtub or shower?: yes Have non-skid surface in bathtub or shower?: yes Good home lighting?: yes Regular seat belt use?: yes Hospital stays in the last year:: no  Cognitive Assessment Difficulty concentrating, remembering, or making decisions? : no Will 6CIT or Mini Cog be Completed: yes What year is it?: 0 points What month is it?: 0 points Give patient an address phrase to remember (5 components): [address], [place]  About what time is it?: 0 points Count backwards from 20 to 1: 0 points Say the months of the year in reverse: 0 points Repeat the address phrase from earlier: 0 points 6 CIT Score: 0 points  Advance Directives (For Healthcare) Does Patient Have a Medical Advance Directive?: Yes Does patient want to make changes to medical advance directive?: No - Patient declined Type of Advance Directive: Healthcare Power of [place]; Living will Copy of Healthcare Power of Attorney in Chart?: No - copy requested Copy of Living Will in Chart?: No - copy requested Would patient like information on creating a medical advance directive?: No - Patient declined  Reviewed/Updated  Reviewed/Updated: Reviewed All (Medical, Surgical, Family, Medications, Allergies, Care Teams, Patient Goals)    Allergies (verified) Morphine    Current Medications (verified) Outpatient Encounter Medications as of 11/21/2024  Medication Sig    acetaminophen  (TYLENOL ) 500 MG tablet Take 500 mg by mouth daily as needed for mild pain or headache.   amLODipine  (NORVASC ) 5 MG tablet Take 1 tablet (5 mg total) by mouth daily.   aspirin  81 MG chewable tablet Chew 1 tablet (81 mg total) by mouth daily.   estradiol  (ESTRACE ) 0.01 % CREA vaginal cream PLACE 1 APPLICATORFUL VAGINALLY  TWICE WEEKLY   fluticasone  (FLONASE ) 50 MCG/ACT nasal spray Place 2 sprays into both nostrils daily.   ibuprofen (ADVIL) 200 MG tablet Take 200 mg by mouth daily as needed for mild pain or headache.   isosorbide  mononitrate (IMDUR ) 30 MG 24 hr tablet Take 1 tablet (30 mg total) by mouth daily.   Multiple Vitamin (MULTIVITAMIN) tablet Take 1 tablet by mouth daily.   nitroGLYCERIN  (NITROSTAT ) 0.4 MG SL tablet Place 1 tablet (0.4 mg total) under the tongue every 5 (five) minutes as needed for chest pain.   rosuvastatin  (CRESTOR ) 10 MG tablet Take 1 tablet (10 mg total) by mouth daily.   No facility-administered encounter medications on file as of 11/21/2024.    History: Past Medical History:  Diagnosis Date   Headache    History of chicken pox    childhood   Hypertension    Measles as a child   Other and unspecified hyperlipidemia 02/11/2013   Preventative health care 02/11/2013   Past Surgical History:  Procedure Laterality Date   ABDOMINAL HYSTERECTOMY  12/14/1994   and RSO & LS   CATARACT EXTRACTION Left 09/2024   right 09/2024   LEFT HEART CATH AND CORONARY ANGIOGRAPHY N/A 09/03/2022   Procedure: LEFT HEART CATH AND CORONARY ANGIOGRAPHY;  Surgeon: Burnard Debby LABOR, MD;  [facility];  Service: Cardiovascular;  Laterality: N/A;   Family History  Problem Relation Age of Onset   Alcohol abuse Father    Heart disease Father    Stroke Father    Hypertension Father    Diabetes Father        type 2   Cancer Father        prostate   Heart disease Mother    Hypertension Mother    Arthritis Mother    Heart attack Mother 49   Crohn's  disease Mother    Cancer Maternal Grandmother        bone- nonsmoker   Heart disease Son        MI s/p 3 stents, January 2015   Breast cancer Neg Hx    Colon cancer Neg Hx    Social History   Occupational History   Not on file  Tobacco Use   Smoking status: Former    Current packs/day: 0.00    Types: Cigarettes    Start date: 11/13/1981    Quit date: 11/13/2001    Years since quitting: 23.0   Smokeless tobacco: Never   Tobacco comments:    quit in 2003 1 ppd for 20 years  Vaping Use   Vaping status: Not on file  Substance and Sexual Activity   Alcohol use: Yes    Comment: WINE FRIDAY AND SATURDAY   Drug use: No   Sexual activity: Yes    Comment: lives with husband, retired from church, [place]. eats a heart healthy diet, minimal meats.exercises 6 days a week   Tobacco Counseling Counseling given: Not Answered Tobacco comments: quit in 2003 1 ppd for 20 years  SDOH Screenings   Food Insecurity: No Food Insecurity (11/21/2024)  Housing: Low  Risk (11/21/2024)  Transportation Needs: No Transportation Needs (11/21/2024)  Utilities: Not At Risk (11/21/2024)  Alcohol Screen: Low Risk (10/02/2023)  Depression (PHQ2-9): Low Risk (11/21/2024)  Financial Resource Strain: Low Risk (10/02/2023)  Physical Activity: Sufficiently Active (11/21/2024)  Social Connections: Socially Integrated (11/21/2024)  Stress: No Stress Concern Present (11/21/2024)  Tobacco Use: Medium Risk (11/21/2024)  Health Literacy: Adequate Health Literacy (10/04/2023)   See flowsheets for full screening details  Depression Screen PHQ 2 & 9 Depression Scale- Over the past 2 weeks, how often have you been bothered by any of the following problems? Little interest or pleasure in doing things: 0 Feeling down, depressed, or hopeless (PHQ Adolescent also includes...irritable): 0 PHQ-2 Total Score: 0 Trouble falling or staying asleep, or sleeping too much: 0 Feeling tired or having little energy: 0 Poor appetite or overeating (PHQ  Adolescent also includes...weight loss): 0 Feeling bad about yourself - or that you are a failure or have let yourself or your family down: 0 Trouble concentrating on things, such as reading the newspaper or watching television (PHQ Adolescent also includes...like school work): 0 Moving or speaking so slowly that other people could have noticed. Or the opposite - being so fidgety or restless that you have been moving around a lot more than usual: 0 Thoughts that you would be better off dead, or of hurting yourself in some way: 0 PHQ-9 Total Score: 0 If you checked off any problems, how difficult have these problems made it for you to do your work, take care of things at home, or get along with other people?: Not difficult at all     Goals Addressed   None          Objective:    Today's Vitals   11/21/24 0821  Weight: 174 lb (78.9 kg)  Height: 5' 6 (1.676 m)   Body mass index is 28.08 kg/m.  Hearing/Vision screen No results found. Immunizations and Health Maintenance Health Maintenance  Topic Date Due   COVID-19 Vaccine (3 - Moderna risk series) 11/21/2025 (Originally 02/21/2020)   Fecal DNA (Cologuard)  07/25/2025   Medicare Annual Wellness (AWV)  11/21/2025   Mammogram  05/12/2026   DTaP/Tdap/Td (2 - Td or Tdap) 10/02/2028   Pneumococcal Vaccine: 50+ Years  Completed   Influenza Vaccine  Completed   Bone Density Scan  Completed   Hepatitis C Screening  Addressed   Meningococcal B Vaccine  Aged Out   Zoster Vaccines- Shingrix  Discontinued        Assessment/Plan:  This is a routine wellness examination for [place].  Patient Care Team: Copland, Harlene BROCKS, MD as PCP - General (Family Medicine) Kristie Lamprey, MD as Consulting Physician (Gastroenterology) Daughtry, Alm SAUNDERS, OD (Optometry) Gennie Dover, MD as Referring Physician (Ophthalmology) Raford Riggs, MD as Attending Physician (Cardiology)  I have personally reviewed and noted the following in the  patients chart:   Medical and social history Use of alcohol, tobacco or illicit drugs  Current medications and supplements including opioid prescriptions. Functional ability and status Nutritional status Physical activity Advanced directives List of other physicians Hospitalizations, surgeries, and ER visits in previous 12 months Vitals Screenings to include cognitive, depression, and falls Referrals and appointments  Orders Placed This Encounter  Procedures   MM 3D SCREENING MAMMOGRAM BILATERAL BREAST    Standing Status:   Future    Expected Date:   05/12/2025    Expiration Date:   11/21/2025    Reason for Exam (SYMPTOM  OR DIAGNOSIS REQUIRED):  breast cancer screening    Preferred imaging location?:   [facility]   In addition, I have reviewed and discussed with patient certain preventive protocols, quality metrics, and best practice recommendations. A written personalized care plan for preventive services as well as general preventive health recommendations were provided to patient.   Lolita Libra, CMA   11/21/2024   Return in 1 year (on 11/21/2025).  After Visit Summary: (MyChart) Due to this being a telephonic visit, the after visit summary with patients personalized plan was offered to patient via MyChart   Nurse Notes: HM Addressed: Vaccines Due: Pt declines Covid, Shingles vaccines Mammogram ordered DEXA scheduled   "

## 2024-11-21 NOTE — Patient Instructions (Addendum)
 Ms. Ressler,  Thank you for taking the time for your Medicare Wellness Visit. I appreciate your continued commitment to your health goals. Please review the care plan we discussed, and feel free to reach out if I can assist you further.  Please note that Annual Wellness Visits do not include a physical exam. Some assessments may be limited, especially if the visit was conducted virtually. If needed, we may recommend an in-person follow-up with your provider.  Ongoing Care Seeing your primary care provider every 3 to 6 months helps us  monitor your health and provide consistent, personalized care.   Dr Watt: 10/22/25 8:40am, physical Medicare AWV: 11/24/25  8:20am, telephone  Referrals If a referral was made during today's visit and you haven't received any updates within two weeks, please contact the referred provider directly to check on the status.  Mammogram ([facility]) due 05/12/24 or after: [phone]  Recommended Screenings:  Health Maintenance  Topic Date Due   Medicare Annual Wellness Visit  10/03/2024   COVID-19 Vaccine (3 - Moderna risk series) 11/21/2025*   Cologuard (Stool DNA test)  07/25/2025   Breast Cancer Screening  05/12/2026   DTaP/Tdap/Td vaccine (2 - Td or Tdap) 10/02/2028   Pneumococcal Vaccine for age over 47  Completed   Flu Shot  Completed   Osteoporosis screening with Bone Density Scan  Completed   Hepatitis C Screening  Addressed   Meningitis B Vaccine  Aged Out   Zoster (Shingles) Vaccine  Discontinued  *Topic was postponed. The date shown is not the original due date.       11/21/2024    8:26 AM  Advanced Directives  Does Patient Have a Medical Advance Directive? Yes  Type of Estate Agent of [place];Living will  Does patient want to make changes to medical advance directive? No - Patient declined  Copy of Healthcare Power of Attorney in Chart? No - copy requested  Would patient like information on creating a  medical advance directive? No - Patient declined   Bring a copy of your health care power of attorney and living will to the office to be added to your chart at your convenience. You can mail a copy to [facility] [address] [place], [place] [address] or email to [email or web address]  Vision: Annual vision screenings are recommended for early detection of glaucoma, cataracts, and diabetic retinopathy. These exams can also reveal signs of chronic conditions such as diabetes and high blood pressure.  Dental: Annual dental screenings help detect early signs of oral cancer, gum disease, and other conditions linked to overall health, including heart disease and diabetes.  Please see the attached documents for additional preventive care recommendations.

## 2024-11-21 NOTE — Telephone Encounter (Signed)
 Pt states she spoke with you about taking her husband Deyna Carbon, MRN [id number]) as a new pt and she is wanting to schedule an appt for him to establish care.  Please advise / confirm?

## 2024-11-22 ENCOUNTER — Other Ambulatory Visit: Payer: Self-pay | Admitting: Family Medicine

## 2024-11-22 DIAGNOSIS — I10 Essential (primary) hypertension: Secondary | ICD-10-CM

## 2024-12-02 ENCOUNTER — Ambulatory Visit (HOSPITAL_BASED_OUTPATIENT_CLINIC_OR_DEPARTMENT_OTHER)
Admission: RE | Admit: 2024-12-02 | Discharge: 2024-12-02 | Disposition: A | Discharge status: Home / Self Care | Source: Ambulatory Visit | Attending: Family Medicine | Admitting: Family Medicine

## 2024-12-02 ENCOUNTER — Encounter: Payer: Self-pay | Admitting: Family Medicine

## 2024-12-02 DIAGNOSIS — E2839 Other primary ovarian failure: Secondary | ICD-10-CM | POA: Diagnosis present

## 2025-10-22 ENCOUNTER — Encounter: Admitting: Family Medicine

## 2025-11-24 ENCOUNTER — Ambulatory Visit
